# Patient Record
Sex: Female | Born: 1950 | Race: White | Hispanic: No | Smoking: Former smoker
Health system: Southern US, Community
[De-identification: ages and names within clinical notes are randomized; demographics above are authoritative.]

## PROBLEM LIST (undated history)

## (undated) DIAGNOSIS — R3915 Urgency of urination: Secondary | ICD-10-CM

## (undated) DIAGNOSIS — Z9889 Other specified postprocedural states: Secondary | ICD-10-CM

## (undated) DIAGNOSIS — J189 Pneumonia, unspecified organism: Secondary | ICD-10-CM

## (undated) DIAGNOSIS — Z8709 Personal history of other diseases of the respiratory system: Secondary | ICD-10-CM

## (undated) DIAGNOSIS — H9319 Tinnitus, unspecified ear: Secondary | ICD-10-CM

## (undated) DIAGNOSIS — Z8041 Family history of malignant neoplasm of ovary: Secondary | ICD-10-CM

## (undated) DIAGNOSIS — T8859XA Other complications of anesthesia, initial encounter: Secondary | ICD-10-CM

## (undated) DIAGNOSIS — R42 Dizziness and giddiness: Secondary | ICD-10-CM

## (undated) DIAGNOSIS — R51 Headache: Secondary | ICD-10-CM

## (undated) DIAGNOSIS — R519 Headache, unspecified: Secondary | ICD-10-CM

## (undated) DIAGNOSIS — Z800 Family history of malignant neoplasm of digestive organs: Secondary | ICD-10-CM

## (undated) DIAGNOSIS — R112 Nausea with vomiting, unspecified: Secondary | ICD-10-CM

## (undated) DIAGNOSIS — Z973 Presence of spectacles and contact lenses: Secondary | ICD-10-CM

## (undated) DIAGNOSIS — C801 Malignant (primary) neoplasm, unspecified: Secondary | ICD-10-CM

## (undated) DIAGNOSIS — T7840XA Allergy, unspecified, initial encounter: Secondary | ICD-10-CM

## (undated) DIAGNOSIS — K219 Gastro-esophageal reflux disease without esophagitis: Secondary | ICD-10-CM

## (undated) DIAGNOSIS — T4145XA Adverse effect of unspecified anesthetic, initial encounter: Secondary | ICD-10-CM

## (undated) HISTORY — DX: Gastro-esophageal reflux disease without esophagitis: K21.9

## (undated) HISTORY — DX: Allergy, unspecified, initial encounter: T78.40XA

## (undated) HISTORY — PX: APPENDECTOMY: SHX54

## (undated) HISTORY — DX: Family history of malignant neoplasm of ovary: Z80.41

## (undated) HISTORY — DX: Malignant (primary) neoplasm, unspecified: C80.1

## (undated) HISTORY — DX: Family history of malignant neoplasm of digestive organs: Z80.0

---

## 1972-06-14 HISTORY — PX: TUBAL LIGATION: SHX77

## 1975-06-15 HISTORY — PX: DIAGNOSTIC LAPAROSCOPY: SUR761

## 1978-06-14 HISTORY — PX: OTHER SURGICAL HISTORY: SHX169

## 2015-06-20 ENCOUNTER — Ambulatory Visit: Payer: BLUE CROSS/BLUE SHIELD | Attending: Gynecologic Oncology | Admitting: Gynecologic Oncology

## 2015-06-20 ENCOUNTER — Encounter: Payer: Self-pay | Admitting: Gynecologic Oncology

## 2015-06-20 ENCOUNTER — Other Ambulatory Visit (HOSPITAL_BASED_OUTPATIENT_CLINIC_OR_DEPARTMENT_OTHER): Payer: BLUE CROSS/BLUE SHIELD

## 2015-06-20 VITALS — BP 130/57 | HR 63 | Temp 98.2°F | Resp 18 | Ht 66.0 in | Wt 142.5 lb

## 2015-06-20 DIAGNOSIS — R1909 Other intra-abdominal and pelvic swelling, mass and lump: Secondary | ICD-10-CM | POA: Diagnosis not present

## 2015-06-20 DIAGNOSIS — L680 Hirsutism: Secondary | ICD-10-CM

## 2015-06-20 DIAGNOSIS — R1900 Intra-abdominal and pelvic swelling, mass and lump, unspecified site: Secondary | ICD-10-CM

## 2015-06-20 DIAGNOSIS — R971 Elevated cancer antigen 125 [CA 125]: Secondary | ICD-10-CM | POA: Diagnosis not present

## 2015-06-20 DIAGNOSIS — R970 Elevated carcinoembryonic antigen [CEA]: Secondary | ICD-10-CM

## 2015-06-20 DIAGNOSIS — Z800 Family history of malignant neoplasm of digestive organs: Secondary | ICD-10-CM

## 2015-06-20 LAB — BASIC METABOLIC PANEL
ANION GAP: 10 meq/L (ref 3–11)
BUN: 14.7 mg/dL (ref 7.0–26.0)
CALCIUM: 9.2 mg/dL (ref 8.4–10.4)
CO2: 24 mEq/L (ref 22–29)
Chloride: 106 mEq/L (ref 98–109)
Creatinine: 0.8 mg/dL (ref 0.6–1.1)
EGFR: 75 mL/min/{1.73_m2} — AB (ref >90)
Glucose: 90 mg/dl (ref 70–140)
POTASSIUM: 4.1 meq/L (ref 3.5–5.1)
SODIUM: 141 meq/L (ref 136–145)

## 2015-06-20 NOTE — Patient Instructions (Signed)
Preparing for your Surgery  Plan for surgery on June 26, 2015 with Dr. [name] you will be scheduled for an exploratory laparotomy , total abdominal hysterectomy ,  bilateral salpingo-oophorectomy ,possible omentectomy , possible debulking, possible bowel resection.  Pre-operative Testing -You will receive a phone call from presurgical testing at [facility] to arrange for a pre-operative testing appointment before your surgery.  This appointment normally occurs one to two weeks before your scheduled surgery.   -Bring your insurance card, copy of an advanced directive if applicable, medication list  -At that visit, you will be asked to sign a consent for a possible blood transfusion in case a transfusion becomes necessary during surgery.  The need for a blood transfusion is rare but having consent is a necessary part of your care.     -You should not be taking blood thinners or aspirin at least ten days prior to surgery unless instructed by your surgeon.  Day Before Surgery at [place] will be asked to take in only clear liquids the day before surgery.  Examples of clear liquids include broths, jello, and clear juices.  Avoid carbonated beverages.  You will be advised to have nothing to eat or drink after midnight the evening before.  Please drink two bottles of magnesium citrate the morning before surgery.  Start around 9 or 10 am.    Your role in recovery Your role is to become active as soon as directed by your doctor, while still giving yourself time to heal.  Rest when you feel tired. You will be asked to do the following in order to speed your recovery:  - Cough and breathe deeply. This helps toclear and expand your lungs and can prevent pneumonia. You may be given a spirometer to practice deep breathing. A staff member will show you how to use the spirometer. - Do mild physical activity. Walking or moving your legs help your circulation and body functions  return to normal. A staff member will help you when you try to walk and will provide you with simple exercises. Do not try to get up or walk alone the first time. - Actively manage your pain. Managing your pain lets you move in comfort. We will ask you to rate your pain on a scale of zero to 10. It is your responsibility to tell your doctor or nurse where and how much you hurt so your pain can be treated.  Special Considerations -If you are diabetic, you may be placed on insulin after surgery to have closer control over your blood sugars to promote healing and recovery.  This does not mean that you will be discharged on insulin.  If applicable, your oral antidiabetics will be resumed when you are tolerating a solid diet.  -Your final pathology results from surgery should be available by the Friday after surgery and the results will be relayed to you when available.  Blood Transfusion Information WHAT IS A BLOOD TRANSFUSION? A transfusion is the replacement of blood or some of its parts. Blood is made up of multiple cells which provide different functions.  Red blood cells carry oxygen and are used for blood loss replacement.  White blood cells fight against infection.  Platelets control bleeding.  Plasma helps clot blood.  Other blood products are available for specialized needs, such as hemophilia or other clotting disorders. BEFORE THE TRANSFUSION  Who gives blood for transfusions?   You may be able to donate blood to be used at a  later date on yourself (autologous donation).  Relatives can be asked to donate blood. This is generally not any safer than if you have received blood from a stranger. The same precautions are taken to ensure safety when a relative's blood is donated.  Healthy volunteers who are fully evaluated to make sure their blood is safe. This is blood bank blood. Transfusion therapy is the safest it has ever been in the practice of medicine. Before blood is taken from  a donor, a complete history is taken to make sure that person has no history of diseases nor engages in risky social behavior (examples are intravenous drug use or sexual activity with multiple partners). The donor's travel history is screened to minimize risk of transmitting infections, such as malaria. The donated blood is tested for signs of infectious diseases, such as HIV and hepatitis. The blood is then tested to be sure it is compatible with you in order to minimize the chance of a transfusion reaction. If you or a relative donates blood, this is often done in anticipation of surgery and is not appropriate for emergency situations. It takes many days to process the donated blood. RISKS AND COMPLICATIONS Although transfusion therapy is very safe and saves many lives, the main dangers of transfusion include:   Getting an infectious disease.  Developing a transfusion reaction. This is an allergic reaction to something in the blood you were given. Every precaution is taken to prevent this. The decision to have a blood transfusion has been considered carefully by your caregiver before blood is given. Blood is not given unless the benefits outweigh the risks.

## 2015-06-20 NOTE — Progress Notes (Signed)
Consult Note: Gyn-Onc  Consult was requested by Dr. Evie Lacks for the evaluation of Natalie Ball 65 y.o. female  CC:  Chief Complaint  Patient presents with  . pelvic mass    New consult    Assessment/Plan:  [name]  is a 65 y.o.  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  I have personally reviewed the images from the MRI from 06/05/15.  The mass does not appear classic in appearance for ovarian cancer, though I am very concerned for malignancy given her tumor marker elevation. It is possilbe that this is a primary mucinous neoplasm, potentially of GI primary.  We are obtaining a CT chest, abdo/pelvis to better evaluate for metastatic disease. There is no gross peritoneal disease on MRI pelvis.  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  In order to expedite her urge her scheduled her to have surgery with my partner Dr. [name] on June 26 2015. Scars that surgery will likely involve an exploratory laparotomy, total abdominal hysterectomy, bilateral salpingo-oophorectomy, and possible bowel resection. We will mechanically prep her bowels and provider would enter read preoperatively in preparation for this. I discussed the risks of this procedure including  bleeding, infection, damage to internal organs (such as bladder,ureters, bowels), blood clot, reoperation and rehospitalization.  HPI: Natalie Ball is a very pleasant 65 year old woman who is seen in consultation at the request of Dr. Evie Lacks for a complex cystic pelvic and abdominal mass. The patient reports a history of abdominal bloating since December 2016. She also reports the development of hair on her face and lower abdomen in the past 4 months. She saw her gynecologist on 06/04/2015 and reported this feeling of abdominal bloating and distention and some scoliosis ultrasound scan was  performed on 06/05/2015. This showed a large multicystic and septated mass from the right hemipelvis with vascular septations measuring 21 x 14 x 18 cm. There was also traced simple appearing pelvic free fluid. She then underwent an MRI of the pelvis on 06/05/2015 which revealed a grossly normal uterus measuring 8 cm with subserosal fibroids. There was a large complex cystic mass in the anterior pelvis extending into the mid abdomen measuring 24 x 10 x 25 cm. This mass contained numerous internal septations as well as several enhancing soft tissue nodules measuring 2-3 cm in size. 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 Tumor markers were then drawn on 06/05/2015. The CA-125 was elevated at 291.9. The CEA was elevated at 7.1.  The patient has a strong family history for colon cancer on her father's side of the family. Although she is unclear about which relatives specifically had colon cancer and their ages. Her last colonoscopy was a proximally 14 years ago. The patient reports having some altered GI function with intermittent constipation but denies hematochezia or melena or narrowed caliber of stools.   Current Meds:  Outpatient Encounter Prescriptions as of 06/20/2015  Medication Sig  . famotidine (PEPCID) 10 MG tablet Take 10 mg by mouth 2 (two) times daily.  . fluticasone (FLONASE) 50 MCG/ACT nasal spray Place 1 spray into both nostrils daily.  Marland Kitchen ibuprofen (ADVIL,MOTRIN) 200 MG tablet Take 400 mg by mouth every 6 (six) hours as needed.  . loratadine (CLARITIN) 10 MG tablet Take 10 mg by mouth daily as  needed for allergies.  . Papaya Enzyme CHEW Chew by mouth.   No facility-administered encounter medications on file as of 06/20/2015.    Allergy: Not on File  Social Hx:   Social History   Social History  . Marital Status: Widowed    Spouse Name: N/A  . Number of Children: N/A  . Years of Education: N/A   Occupational  History  . Not on file.   Social History Main Topics  . Smoking status: Former Smoker    Quit date: 06/19/1978  . Smokeless tobacco: Not on file  . Alcohol Use: Yes  . Drug Use: No  . Sexual Activity: Not on file   Other Topics Concern  . Not on file   Social History Narrative  . No narrative on file    Past Surgical Hx:  Past Surgical History  Procedure Laterality Date  . Tubal ligation  1974  . Diagnostic laparoscopy  1977  . Rectal fusula  1980    Past Medical Hx:  Past Medical History  Diagnosis Date  . Allergy   . GERD (gastroesophageal reflux disease)     Past Gynecological History:   No LMP recorded.  Family Hx:  Family History  Problem Relation Age of Onset  . Cancer Father   . Cancer Maternal Aunt   . Cancer Maternal Uncle     Review of Systems:  Constitutional  Feels bloated  ENT Normal appearing ears and nares bilaterally Skin/Breast  No rash, sores, jaundice, itching, dryness Cardiovascular  No chest pain, shortness of breath, or edema  Pulmonary  No cough or wheeze.  Gastro Intestinal  No nausea, vomitting, or diarrhoea. No bright red blood per rectum, no abdominal pain, change in bowel movement, or constipation.  Genito Urinary  No frequency, urgency, dysuria, + bloating, no bleeding Musculo Skeletal  No myalgia, arthralgia, joint swelling or pain  Neurologic  No weakness, numbness, change in gait,  Psychology  No depression, anxiety, insomnia.   Vitals:  Blood pressure 130/57, pulse 63, temperature 98.2 F (36.8 C), temperature source Oral, resp. rate 18, height 5\' 6"  (1.676 m), weight 142 lb 8 oz (64.638 kg), SpO2 100 %.  Physical Exam: WD in NAD Neck  Supple NROM, without any enlargements.  Lymph Node Survey No cervical supraclavicular or inguinal adenopathy Cardiovascular  Pulse normal rate, regularity and rhythm. S1 and S2 normal.  Lungs  Clear to auscultation bilateraly, without wheezes/crackles/rhonchi. Good air  movement.  Skin  No rash/lesions/breakdown  Psychiatry  Alert and oriented to person, place, and time  Abdomen  Normoactive bowel sounds, abdomen soft, non-tender and thin without evidence of hernia. Large cystic fullness in the lower abdomen to umbilicus.  Back No CVA tenderness Genito Urinary  Vulva/vagina: Normal external female genitalia.  No lesions. No discharge or bleeding.  Bladder/urethra:  No lesions or masses, well supported bladder  Vagina: grossly normal  Cervix: Normal appearing, no lesions.  Uterus:  Small, mobile, no parametrial involvement or nodularity.  Adnexa: large soft, cystic fullness throughout the pelvis. Predominantly anteriorally. Not palpable in the cul de sac.  Rectal  Good tone, no masses no cul de sac nodularity.  Extremities  No bilateral cyanosis, clubbing or edema.   Donaciano Eva, MD  06/20/2015, 4:45 PM

## 2015-06-24 ENCOUNTER — Ambulatory Visit (HOSPITAL_COMMUNITY)
Admission: RE | Admit: 2015-06-24 | Discharge: 2015-06-24 | Disposition: A | Discharge status: Home / Self Care | Payer: BLUE CROSS/BLUE SHIELD | Source: Ambulatory Visit | Attending: Gynecologic Oncology | Admitting: Gynecologic Oncology

## 2015-06-24 ENCOUNTER — Encounter (HOSPITAL_COMMUNITY)
Admission: RE | Admit: 2015-06-24 | Discharge: 2015-06-24 | Disposition: A | Discharge status: Home / Self Care | Payer: BLUE CROSS/BLUE SHIELD | Source: Ambulatory Visit | Attending: Gynecologic Oncology | Admitting: Gynecologic Oncology

## 2015-06-24 ENCOUNTER — Encounter (HOSPITAL_COMMUNITY): Payer: Self-pay

## 2015-06-24 DIAGNOSIS — K449 Diaphragmatic hernia without obstruction or gangrene: Secondary | ICD-10-CM

## 2015-06-24 DIAGNOSIS — Z01812 Encounter for preprocedural laboratory examination: Secondary | ICD-10-CM | POA: Insufficient documentation

## 2015-06-24 DIAGNOSIS — R1900 Intra-abdominal and pelvic swelling, mass and lump, unspecified site: Secondary | ICD-10-CM | POA: Insufficient documentation

## 2015-06-24 DIAGNOSIS — R971 Elevated cancer antigen 125 [CA 125]: Secondary | ICD-10-CM | POA: Insufficient documentation

## 2015-06-24 DIAGNOSIS — R970 Elevated carcinoembryonic antigen [CEA]: Secondary | ICD-10-CM | POA: Insufficient documentation

## 2015-06-24 HISTORY — DX: Pneumonia, unspecified organism: J18.9

## 2015-06-24 HISTORY — DX: Dizziness and giddiness: R42

## 2015-06-24 HISTORY — DX: Headache, unspecified: R51.9

## 2015-06-24 HISTORY — DX: Adverse effect of unspecified anesthetic, initial encounter: T41.45XA

## 2015-06-24 HISTORY — DX: Urgency of urination: R39.15

## 2015-06-24 HISTORY — DX: Personal history of other diseases of the respiratory system: Z87.09

## 2015-06-24 HISTORY — DX: Presence of spectacles and contact lenses: Z97.3

## 2015-06-24 HISTORY — DX: Tinnitus, unspecified ear: H93.19

## 2015-06-24 HISTORY — DX: Other specified postprocedural states: R11.2

## 2015-06-24 HISTORY — DX: Headache: R51

## 2015-06-24 HISTORY — DX: Other complications of anesthesia, initial encounter: T88.59XA

## 2015-06-24 HISTORY — DX: Other specified postprocedural states: Z98.890

## 2015-06-24 LAB — CBC WITH DIFFERENTIAL/PLATELET
BASOS ABS: 0 10*3/uL (ref 0.0–0.1)
Basophils Relative: 0 %
EOS ABS: 0.1 10*3/uL (ref 0.0–0.7)
EOS PCT: 1 %
HCT: 39.8 % (ref 36.0–46.0)
Hemoglobin: 12.9 g/dL (ref 12.0–15.0)
LYMPHS PCT: 18 %
Lymphs Abs: 1.6 10*3/uL (ref 0.7–4.0)
MCH: 32.1 pg (ref 26.0–34.0)
MCHC: 32.4 g/dL (ref 30.0–36.0)
MCV: 99 fL (ref 78.0–100.0)
Monocytes Absolute: 0.5 10*3/uL (ref 0.1–1.0)
Monocytes Relative: 6 %
Neutro Abs: 6.5 10*3/uL (ref 1.7–7.7)
Neutrophils Relative %: 75 %
PLATELETS: 354 10*3/uL (ref 150–400)
RBC: 4.02 MIL/uL (ref 3.87–5.11)
RDW: 13.4 % (ref 11.5–15.5)
WBC: 8.8 10*3/uL (ref 4.0–10.5)

## 2015-06-24 LAB — ABO/RH: ABO/RH(D): A POS

## 2015-06-24 LAB — COMPREHENSIVE METABOLIC PANEL
ALT: 11 U/L — AB (ref 14–54)
AST: 16 U/L (ref 15–41)
Albumin: 4.4 g/dL (ref 3.5–5.0)
Alkaline Phosphatase: 56 U/L (ref 38–126)
Anion gap: 8 (ref 5–15)
BUN: 13 mg/dL (ref 6–20)
CHLORIDE: 103 mmol/L (ref 101–111)
CO2: 27 mmol/L (ref 22–32)
CREATININE: 0.74 mg/dL (ref 0.44–1.00)
Calcium: 9.2 mg/dL (ref 8.9–10.3)
GFR calc Af Amer: >60 mL/min (ref >60)
GFR calc non Af Amer: >60 mL/min (ref >60)
Glucose, Bld: 90 mg/dL (ref 65–99)
Potassium: 4.3 mmol/L (ref 3.5–5.1)
SODIUM: 138 mmol/L (ref 135–145)
Total Bilirubin: 0.9 mg/dL (ref 0.3–1.2)
Total Protein: 7.4 g/dL (ref 6.5–8.1)

## 2015-06-24 LAB — URINALYSIS, ROUTINE W REFLEX MICROSCOPIC
Bilirubin Urine: NEGATIVE
Glucose, UA: NEGATIVE mg/dL
Hgb urine dipstick: NEGATIVE
Ketones, ur: 15 mg/dL — AB
Leukocytes, UA: NEGATIVE
NITRITE: NEGATIVE
Protein, ur: NEGATIVE mg/dL
SPECIFIC GRAVITY, URINE: 1.015 (ref 1.005–1.030)
pH: 6.5 (ref 5.0–8.0)

## 2015-06-24 MED ORDER — IOHEXOL 300 MG/ML  SOLN
100.0000 mL | Freq: Once | INTRAMUSCULAR | Status: AC | PRN
  Administered 2015-06-24: 100 mL via INTRAVENOUS

## 2015-06-24 NOTE — Patient Instructions (Addendum)
Natalie Ball  06/24/2015   Your procedure is scheduled on: Thursday June 26, 2015   Report to [facility] Main  Entrance take [place]  elevators to 3rd floor to  [place] at 11:45 AM.  Call this number if you have problems the morning of surgery [phone]   Remember: ONLY 1 PERSON MAY GO WITH YOU TO SHORT STAY TO GET  READY MORNING OF [place].  Do not eat food or drink liquids :After Midnight. CLEAR LIQUID DIET DAY PRIOR TO SURGERY. NO CARBONATED BEVERAGES.      Take these medicines the morning of surgery with A SIP OF WATER: FAMOTIDINE (PEPCID); FLONASE IF NEEDED; LORATADINE (CLARITIN) IF NEEDED                                You may not have any metal on your body including hair pins and              piercings  Do not wear jewelry, make-up, lotions, powders or perfumes, deodorant             Do not wear nail polish.  Do not shave  48 hours prior to surgery.               Do not bring valuables to the hospital. [name].  Contacts, dentures or bridgework may not be worn into surgery.  Leave suitcase in the car. After surgery it may be brought to your room.              FOLLOW SURGEON'S INSTRUCTION IN REGARDS TO BOWEL PREPRATION PRIOR TO SURGERY                 Please read over the following fact sheets you were given:INCENTIVE SPIROMETER; BLOOD TRANSFUSION INFORMATION SHEET  _____________________________________________________________________             [facility] - Preparing for Surgery Before surgery, you can play an important role.  Because skin is not sterile, your skin needs to be as free of germs as possible.  You can reduce the number of germs on your skin by washing with CHG (chlorahexidine gluconate) soap before surgery.  CHG is an antiseptic cleaner which kills germs and bonds with the skin to continue killing germs even after washing. Please DO NOT use if you have an allergy to  CHG or antibacterial soaps.  If your skin becomes reddened/irritated stop using the CHG and inform your nurse when you arrive at Short Stay. Do not shave (including legs and underarms) for at least 48 hours prior to the first CHG shower.  You may shave your face/neck. Please follow these instructions carefully:  1.  Shower with CHG Soap the night before surgery and the  morning of Surgery.  2.  If you choose to wash your hair, wash your hair first as usual with your  normal  shampoo.  3.  After you shampoo, rinse your hair and body thoroughly to remove the  shampoo.                           4.  Use CHG as you would any other liquid soap.  You can apply  chg directly  to the skin and wash                       Gently with a scrungie or clean washcloth.  5.  Apply the CHG Soap to your body ONLY FROM THE NECK DOWN.   Do not use on face/ open                           Wound or open sores. Avoid contact with eyes, ears mouth and genitals (private parts).                       Wash face,  Genitals (private parts) with your normal soap.             6.  Wash thoroughly, paying special attention to the area where your surgery  will be performed.  7.  Thoroughly rinse your body with warm water from the neck down.  8.  DO NOT shower/wash with your normal soap after using and rinsing off  the CHG Soap.                9.  Pat yourself dry with a clean towel.            10.  Wear clean pajamas.            11.  Place clean sheets on your bed the night of your first shower and do not  sleep with pets. Day of Surgery : Do not apply any lotions/deodorants the morning of surgery.  Please wear clean clothes to the hospital/surgery center.  FAILURE TO FOLLOW THESE INSTRUCTIONS MAY RESULT IN THE CANCELLATION OF YOUR SURGERY PATIENT SIGNATURE_________________________________  NURSE SIGNATURE__________________________________  ________________________________________________________________________   [name]  An incentive spirometer is a tool that can help keep your lungs clear and active. This tool measures how well you are filling your lungs with each breath. Taking long deep breaths may help reverse or decrease the chance of developing breathing (pulmonary) problems (especially infection) following:  A long period of time when you are unable to move or be active. BEFORE THE PROCEDURE   If the spirometer includes an indicator to show your best effort, your nurse or respiratory therapist will set it to a desired goal.  If possible, sit up straight or lean slightly forward. Try not to slouch.  Hold the incentive spirometer in an upright position. INSTRUCTIONS FOR USE   Sit on the edge of your bed if possible, or sit up as far as you can in bed or on a chair.  Hold the incentive spirometer in an upright position.  Breathe out normally.  Place the mouthpiece in your mouth and seal your lips tightly around it.  Breathe in slowly and as deeply as possible, raising the piston or the ball toward the top of the column.  Hold your breath for 3-5 seconds or for as long as possible. Allow the piston or ball to fall to the bottom of the column.  Remove the mouthpiece from your mouth and breathe out normally.  Rest for a few seconds and repeat Steps 1 through 7 at least 10 times every 1-2 hours when you are awake. Take your time and take a few normal breaths between deep breaths.  The spirometer may include an indicator to show your best effort. Use the indicator as a goal to work toward during each repetition.  After each set of 10 deep breaths, practice coughing to be sure your lungs are clear. If you have an incision (the cut made at the time of surgery), support your incision when coughing by placing a pillow or rolled up towels firmly against it. Once you are able to get out of bed, walk around indoors and cough well. You may stop using the incentive spirometer when instructed by  your caregiver.  RISKS AND COMPLICATIONS  Take your time so you do not get dizzy or light-headed.  If you are in pain, you may need to take or ask for pain medication before doing incentive spirometry. It is harder to take a deep breath if you are having pain. AFTER USE  Rest and breathe slowly and easily.  It can be helpful to keep track of a log of your progress. Your caregiver can provide you with a simple table to help with this. If you are using the spirometer at home, follow these instructions: [place] IF:   You are having difficultly using the spirometer.  You have trouble using the spirometer as often as instructed.  Your pain medication is not giving enough relief while using the spirometer.  You develop fever of 100.5 F (38.1 C) or higher. SEEK IMMEDIATE MEDICAL CARE IF:   You cough up bloody sputum that had not been present before.  You develop fever of 102 F (38.9 C) or greater.  You develop worsening pain at or near the incision site. MAKE SURE YOU:   Understand these instructions.  Will watch your condition.  Will get help right away if you are not doing well or get worse. Document Released: 10/11/2006 Document Revised: 08/23/2011 Document Reviewed: 12/12/2006 ExitCare Patient Information 2014 ExitCare, [place].   ________________________________________________________________________  WHAT IS A BLOOD TRANSFUSION? Blood Transfusion Information  A transfusion is the replacement of blood or some of its parts. Blood is made up of multiple cells which provide different functions.  Red blood cells carry oxygen and are used for blood loss replacement.  White blood cells fight against infection.  Platelets control bleeding.  Plasma helps clot blood.  Other blood products are available for specialized needs, such as hemophilia or other clotting disorders. BEFORE THE TRANSFUSION  Who gives blood for transfusions?   Healthy volunteers who are  fully evaluated to make sure their blood is safe. This is blood bank blood. Transfusion therapy is the safest it has ever been in the practice of medicine. Before blood is taken from a donor, a complete history is taken to make sure that person has no history of diseases nor engages in risky social behavior (examples are intravenous drug use or sexual activity with multiple partners). The donor's travel history is screened to minimize risk of transmitting infections, such as malaria. The donated blood is tested for signs of infectious diseases, such as HIV and hepatitis. The blood is then tested to be sure it is compatible with you in order to minimize the chance of a transfusion reaction. If you or a relative donates blood, this is often done in anticipation of surgery and is not appropriate for emergency situations. It takes many days to process the donated blood. RISKS AND COMPLICATIONS Although transfusion therapy is very safe and saves many lives, the main dangers of transfusion include:   Getting an infectious disease.  Developing a transfusion reaction. This is an allergic reaction to something in the blood you were given. Every precaution is taken to prevent this. The decision  to have a blood transfusion has been considered carefully by your caregiver before blood is given. Blood is not given unless the benefits outweigh the risks. AFTER THE TRANSFUSION  Right after receiving a blood transfusion, you will usually feel much better and more energetic. This is especially true if your red blood cells have gotten low (anemic). The transfusion raises the level of the red blood cells which carry oxygen, and this usually causes an energy increase.  The nurse administering the transfusion will monitor you carefully for complications. HOME CARE INSTRUCTIONS  No special instructions are needed after a transfusion. You may find your energy is better. Speak with your caregiver about any limitations on  activity for underlying diseases you may have. SEEK MEDICAL CARE IF:   Your condition is not improving after your transfusion.  You develop redness or irritation at the intravenous (IV) site. SEEK IMMEDIATE MEDICAL CARE IF:  Any of the following symptoms occur over the next 12 hours:  Shaking chills.  You have a temperature by mouth above 102 F (38.9 C), not controlled by medicine.  Chest, back, or muscle pain.  People around you feel you are not acting correctly or are confused.  Shortness of breath or difficulty breathing.  Dizziness and fainting.  You get a rash or develop hives.  You have a decrease in urine output.  Your urine turns a dark color or changes to pink, red, or brown. Any of the following symptoms occur over the next 10 days:  You have a temperature by mouth above 102 F (38.9 C), not controlled by medicine.  Shortness of breath.  Weakness after normal activity.  The white part of the eye turns yellow (jaundice).  You have a decrease in the amount of urine or are urinating less often.  Your urine turns a dark color or changes to pink, red, or brown. Document Released: 05/28/2000 Document Revised: 08/23/2011 Document Reviewed: 01/15/2008 [facility] Patient Information 2014 [place], [place].  _______________________________________________________________________

## 2015-06-25 NOTE — Progress Notes (Signed)
Urinalysis results in epic per PAT visit 06/24/2015 sent to Dr Rogers Blocker, and Dr Denman George.

## 2015-06-26 ENCOUNTER — Inpatient Hospital Stay (HOSPITAL_COMMUNITY): Payer: BLUE CROSS/BLUE SHIELD | Admitting: Anesthesiology

## 2015-06-26 ENCOUNTER — Inpatient Hospital Stay (HOSPITAL_COMMUNITY)
Admission: RE | Admit: 2015-06-26 | Discharge: 2015-06-28 | DRG: 738 | Disposition: A | Discharge status: Home / Self Care | Payer: BLUE CROSS/BLUE SHIELD | Source: Ambulatory Visit | Attending: Gynecologic Oncology | Admitting: Gynecologic Oncology

## 2015-06-26 ENCOUNTER — Encounter (HOSPITAL_COMMUNITY): Payer: Self-pay | Admitting: *Deleted

## 2015-06-26 ENCOUNTER — Encounter (HOSPITAL_COMMUNITY)
Admission: RE | Disposition: A | Discharge status: Home / Self Care | Payer: Self-pay | Source: Ambulatory Visit | Attending: Gynecologic Oncology

## 2015-06-26 DIAGNOSIS — Z01812 Encounter for preprocedural laboratory examination: Secondary | ICD-10-CM

## 2015-06-26 DIAGNOSIS — Z87891 Personal history of nicotine dependence: Secondary | ICD-10-CM | POA: Diagnosis not present

## 2015-06-26 DIAGNOSIS — D0739 Carcinoma in situ of other female genital organs: Secondary | ICD-10-CM

## 2015-06-26 DIAGNOSIS — Z800 Family history of malignant neoplasm of digestive organs: Secondary | ICD-10-CM | POA: Diagnosis not present

## 2015-06-26 DIAGNOSIS — K219 Gastro-esophageal reflux disease without esophagitis: Secondary | ICD-10-CM | POA: Diagnosis present

## 2015-06-26 DIAGNOSIS — C561 Malignant neoplasm of right ovary: Principal | ICD-10-CM | POA: Diagnosis present

## 2015-06-26 DIAGNOSIS — R1900 Intra-abdominal and pelvic swelling, mass and lump, unspecified site: Secondary | ICD-10-CM | POA: Diagnosis present

## 2015-06-26 DIAGNOSIS — D252 Subserosal leiomyoma of uterus: Secondary | ICD-10-CM | POA: Diagnosis present

## 2015-06-26 HISTORY — PX: ABDOMINAL HYSTERECTOMY: SHX81

## 2015-06-26 HISTORY — PX: SALPINGOOPHORECTOMY: SHX82

## 2015-06-26 HISTORY — PX: OMENTECTOMY: SHX5985

## 2015-06-26 HISTORY — PX: LAPAROTOMY: SHX154

## 2015-06-26 HISTORY — PX: DEBULKING: SHX6277

## 2015-06-26 LAB — TYPE AND SCREEN
ABO/RH(D): A POS
Antibody Screen: NEGATIVE

## 2015-06-26 SURGERY — LAPAROTOMY, EXPLORATORY
Anesthesia: General | Site: Abdomen

## 2015-06-26 MED ORDER — ENSURE ENLIVE PO LIQD
237.0000 mL | Freq: Two times a day (BID) | ORAL | Status: DC
  Administered 2015-06-27: 237 mL via ORAL

## 2015-06-26 MED ORDER — ONDANSETRON HCL 4 MG PO TABS
4.0000 mg | ORAL_TABLET | Freq: Four times a day (QID) | ORAL | Status: DC | PRN
  Administered 2015-06-28: 4 mg via ORAL
  Filled 2015-06-26: qty 1

## 2015-06-26 MED ORDER — HYDROMORPHONE HCL 1 MG/ML IJ SOLN
INTRAMUSCULAR | Status: AC
  Filled 2015-06-26: qty 1

## 2015-06-26 MED ORDER — ESMOLOL HCL 100 MG/10ML IV SOLN
INTRAVENOUS | Status: AC
  Filled 2015-06-26: qty 10

## 2015-06-26 MED ORDER — PROPOFOL 10 MG/ML IV BOLUS
INTRAVENOUS | Status: DC | PRN
  Administered 2015-06-26: 120 mg via INTRAVENOUS
  Administered 2015-06-26: 80 mg via INTRAVENOUS

## 2015-06-26 MED ORDER — SUGAMMADEX SODIUM 200 MG/2ML IV SOLN
INTRAVENOUS | Status: AC
  Filled 2015-06-26: qty 2

## 2015-06-26 MED ORDER — HEPARIN SODIUM (PORCINE) 5000 UNIT/ML IJ SOLN
5000.0000 [IU] | INTRAMUSCULAR | Status: AC
  Administered 2015-06-26: 5000 [IU] via SUBCUTANEOUS
  Filled 2015-06-26: qty 1

## 2015-06-26 MED ORDER — PROMETHAZINE HCL 25 MG/ML IJ SOLN
INTRAMUSCULAR | Status: AC
  Filled 2015-06-26: qty 1

## 2015-06-26 MED ORDER — LACTATED RINGERS IV SOLN
INTRAVENOUS | Status: DC
  Administered 2015-06-26: 1000 mL via INTRAVENOUS
  Administered 2015-06-26 (×3): via INTRAVENOUS

## 2015-06-26 MED ORDER — FLUTICASONE PROPIONATE 50 MCG/ACT NA SUSP
1.0000 | Freq: Every day | NASAL | Status: DC
  Administered 2015-06-27 – 2015-06-28 (×2): 1 via NASAL
  Filled 2015-06-26 (×2): qty 16

## 2015-06-26 MED ORDER — PROMETHAZINE HCL 25 MG/ML IJ SOLN
6.2500 mg | INTRAMUSCULAR | Status: DC | PRN
  Administered 2015-06-26: 12.5 mg via INTRAVENOUS

## 2015-06-26 MED ORDER — SODIUM CHLORIDE 0.9 % IJ SOLN
INTRAMUSCULAR | Status: AC
  Filled 2015-06-26: qty 100

## 2015-06-26 MED ORDER — PHENYLEPHRINE HCL 10 MG/ML IJ SOLN
INTRAMUSCULAR | Status: DC | PRN
  Administered 2015-06-26 (×3): 80 ug via INTRAVENOUS

## 2015-06-26 MED ORDER — DEXTROSE 5 % IV SOLN
2.0000 g | INTRAVENOUS | Status: AC
Start: 2015-06-26 — End: 2015-06-26
  Administered 2015-06-26: 2 g via INTRAVENOUS

## 2015-06-26 MED ORDER — OXYCODONE HCL 5 MG/5ML PO SOLN: 5.0000 mg | Freq: Once | ORAL | Status: DC | PRN

## 2015-06-26 MED ORDER — PHENOL 1.4 % MT LIQD
1.0000 | OROMUCOSAL | Status: DC | PRN
  Filled 2015-06-26: qty 177

## 2015-06-26 MED ORDER — SCOPOLAMINE 1 MG/3DAYS TD PT72
MEDICATED_PATCH | TRANSDERMAL | Status: AC
  Filled 2015-06-26: qty 1

## 2015-06-26 MED ORDER — ROCURONIUM BROMIDE 100 MG/10ML IV SOLN
INTRAVENOUS | Status: DC | PRN
  Administered 2015-06-26: 50 mg via INTRAVENOUS
  Administered 2015-06-26: 20 mg via INTRAVENOUS

## 2015-06-26 MED ORDER — ONDANSETRON HCL 4 MG/2ML IJ SOLN
INTRAMUSCULAR | Status: AC
  Filled 2015-06-26: qty 2

## 2015-06-26 MED ORDER — LIP MEDEX EX OINT
TOPICAL_OINTMENT | CUTANEOUS | Status: AC
  Filled 2015-06-26: qty 7

## 2015-06-26 MED ORDER — 0.9 % SODIUM CHLORIDE (POUR BTL) OPTIME
TOPICAL | Status: DC | PRN
  Administered 2015-06-26: 3000 mL

## 2015-06-26 MED ORDER — SCOPOLAMINE 1 MG/3DAYS TD PT72
1.0000 | MEDICATED_PATCH | Freq: Once | TRANSDERMAL | Status: DC
  Administered 2015-06-26: 1.5 mg via TRANSDERMAL
  Filled 2015-06-26: qty 1

## 2015-06-26 MED ORDER — FENTANYL CITRATE (PF) 100 MCG/2ML IJ SOLN
INTRAMUSCULAR | Status: DC | PRN
  Administered 2015-06-26: 50 ug via INTRAVENOUS
  Administered 2015-06-26: 100 ug via INTRAVENOUS
  Administered 2015-06-26 (×2): 50 ug via INTRAVENOUS

## 2015-06-26 MED ORDER — DEXTROSE 5 % IV SOLN
INTRAVENOUS | Status: AC
  Filled 2015-06-26: qty 2

## 2015-06-26 MED ORDER — ONDANSETRON HCL 4 MG/2ML IJ SOLN: 4.0000 mg | Freq: Four times a day (QID) | INTRAMUSCULAR | Status: DC | PRN

## 2015-06-26 MED ORDER — LIDOCAINE HCL (CARDIAC) 20 MG/ML IV SOLN
INTRAVENOUS | Status: DC | PRN
  Administered 2015-06-26: 50 mg via INTRAVENOUS

## 2015-06-26 MED ORDER — HYDROMORPHONE HCL 1 MG/ML IJ SOLN
INTRAMUSCULAR | Status: DC | PRN
  Administered 2015-06-26 (×3): 0.5 mg via INTRAVENOUS

## 2015-06-26 MED ORDER — ESMOLOL HCL 100 MG/10ML IV SOLN
INTRAVENOUS | Status: DC | PRN
  Administered 2015-06-26: 15 mg via INTRAVENOUS

## 2015-06-26 MED ORDER — SODIUM CHLORIDE 0.9 % IJ SOLN
INTRAMUSCULAR | Status: AC
  Filled 2015-06-26: qty 50

## 2015-06-26 MED ORDER — MIDAZOLAM HCL 5 MG/5ML IJ SOLN
INTRAMUSCULAR | Status: DC | PRN
  Administered 2015-06-26: 2 mg via INTRAVENOUS

## 2015-06-26 MED ORDER — PROPOFOL 10 MG/ML IV BOLUS
INTRAVENOUS | Status: AC
  Filled 2015-06-26: qty 20

## 2015-06-26 MED ORDER — PHENYLEPHRINE 40 MCG/ML (10ML) SYRINGE FOR IV PUSH (FOR BLOOD PRESSURE SUPPORT)
PREFILLED_SYRINGE | INTRAVENOUS | Status: AC
  Filled 2015-06-26: qty 10

## 2015-06-26 MED ORDER — FAMOTIDINE 20 MG PO TABS
20.0000 mg | ORAL_TABLET | Freq: Every day | ORAL | Status: DC
  Administered 2015-06-27 – 2015-06-28 (×2): 20 mg via ORAL
  Filled 2015-06-26 (×2): qty 1

## 2015-06-26 MED ORDER — OXYCODONE HCL 5 MG PO TABS: 5.0000 mg | ORAL_TABLET | Freq: Once | ORAL | Status: DC | PRN

## 2015-06-26 MED ORDER — MIDAZOLAM HCL 2 MG/2ML IJ SOLN
INTRAMUSCULAR | Status: AC
  Filled 2015-06-26: qty 2

## 2015-06-26 MED ORDER — SUGAMMADEX SODIUM 200 MG/2ML IV SOLN
INTRAVENOUS | Status: DC | PRN
  Administered 2015-06-26: 200 mg via INTRAVENOUS

## 2015-06-26 MED ORDER — OXYCODONE HCL 5 MG PO TABS: 5.0000 mg | ORAL_TABLET | ORAL | Status: DC | PRN

## 2015-06-26 MED ORDER — GABAPENTIN 300 MG PO CAPS
600.0000 mg | ORAL_CAPSULE | Freq: Every day | ORAL | Status: DC
  Administered 2015-06-26 – 2015-06-27 (×2): 600 mg via ORAL
  Filled 2015-06-26 (×3): qty 2

## 2015-06-26 MED ORDER — HYDROMORPHONE HCL 2 MG/ML IJ SOLN
INTRAMUSCULAR | Status: AC
  Filled 2015-06-26: qty 1

## 2015-06-26 MED ORDER — KETOROLAC TROMETHAMINE 30 MG/ML IJ SOLN
15.0000 mg | Freq: Four times a day (QID) | INTRAMUSCULAR | Status: AC
  Filled 2015-06-26: qty 1

## 2015-06-26 MED ORDER — KETOROLAC TROMETHAMINE 30 MG/ML IJ SOLN
15.0000 mg | Freq: Four times a day (QID) | INTRAMUSCULAR | Status: AC
  Administered 2015-06-27 (×3): 15 mg via INTRAVENOUS
  Filled 2015-06-26 (×3): qty 1

## 2015-06-26 MED ORDER — FENTANYL CITRATE (PF) 250 MCG/5ML IJ SOLN
INTRAMUSCULAR | Status: AC
  Filled 2015-06-26: qty 5

## 2015-06-26 MED ORDER — ACETAMINOPHEN 500 MG PO TABS
1000.0000 mg | ORAL_TABLET | Freq: Four times a day (QID) | ORAL | Status: DC
  Administered 2015-06-26 – 2015-06-28 (×5): 1000 mg via ORAL
  Filled 2015-06-26 (×12): qty 2

## 2015-06-26 MED ORDER — ENOXAPARIN SODIUM 40 MG/0.4ML ~~LOC~~ SOLN
40.0000 mg | SUBCUTANEOUS | Status: DC
  Administered 2015-06-27 – 2015-06-28 (×2): 40 mg via SUBCUTANEOUS
  Filled 2015-06-26 (×3): qty 0.4

## 2015-06-26 MED ORDER — LIDOCAINE HCL (CARDIAC) 20 MG/ML IV SOLN
INTRAVENOUS | Status: AC
  Filled 2015-06-26: qty 5

## 2015-06-26 MED ORDER — HYDROMORPHONE HCL 1 MG/ML IJ SOLN
0.2500 mg | INTRAMUSCULAR | Status: DC | PRN
  Administered 2015-06-26: 0.25 mg via INTRAVENOUS

## 2015-06-26 MED ORDER — SODIUM CHLORIDE 0.9 % IJ SOLN
INTRAMUSCULAR | Status: AC
  Filled 2015-06-26: qty 20

## 2015-06-26 MED ORDER — ONDANSETRON HCL 4 MG/2ML IJ SOLN
INTRAMUSCULAR | Status: DC | PRN
  Administered 2015-06-26: 4 mg via INTRAVENOUS

## 2015-06-26 MED ORDER — KCL IN DEXTROSE-NACL 20-5-0.45 MEQ/L-%-% IV SOLN
INTRAVENOUS | Status: DC
  Administered 2015-06-26: 23:00:00 via INTRAVENOUS
  Filled 2015-06-26 (×3): qty 1000

## 2015-06-26 MED ORDER — BUPIVACAINE LIPOSOME 1.3 % IJ SUSP
20.0000 mL | Freq: Once | INTRAMUSCULAR | Status: AC
  Administered 2015-06-26: 20 mL
  Filled 2015-06-26: qty 20

## 2015-06-26 MED ORDER — EPHEDRINE SULFATE 50 MG/ML IJ SOLN
INTRAMUSCULAR | Status: DC | PRN
  Administered 2015-06-26: 10 mg via INTRAVENOUS

## 2015-06-26 SURGICAL SUPPLY — 68 items
APPLIER CLIP 11 MED OPEN (CLIP)
APPLIER CLIP 13 LRG OPEN (CLIP)
ATTRACTOMAT 16X20 MAGNETIC DRP (DRAPES) IMPLANT
BAG URINE DRAINAGE (UROLOGICAL SUPPLIES) IMPLANT
BENZOIN TINCTURE PRP APPL 2/3 (GAUZE/BANDAGES/DRESSINGS) IMPLANT
BLADE EXTENDED COATED 6.5IN (ELECTRODE) ×4 IMPLANT
CATH FOLEY 2WAY SLVR  5CC 16FR (CATHETERS) ×2
CATH FOLEY 2WAY SLVR 5CC 16FR (CATHETERS) ×2 IMPLANT
CHLORAPREP W/TINT 26ML (MISCELLANEOUS) ×4 IMPLANT
CLIP APPLIE 11 MED OPEN (CLIP) IMPLANT
CLIP APPLIE 13 LRG OPEN (CLIP) IMPLANT
CLIP TI MEDIUM LARGE 6 (CLIP) ×8 IMPLANT
CLOSURE WOUND 1/2 X4 (GAUZE/BANDAGES/DRESSINGS)
CONT SPEC 4OZ CLIKSEAL STRL BL (MISCELLANEOUS) ×4 IMPLANT
COVER SURGICAL LIGHT HANDLE (MISCELLANEOUS) ×4 IMPLANT
DERMABOND ADVANCED (GAUZE/BANDAGES/DRESSINGS) ×2
DERMABOND ADVANCED .7 DNX12 (GAUZE/BANDAGES/DRESSINGS) ×2 IMPLANT
DRAPE TABLE BACK 44X90 PK DISP (DRAPES) ×4 IMPLANT
DRAPE UTILITY XL STRL (DRAPES) IMPLANT
DRAPE WARM FLUID 44X44 (DRAPE) ×4 IMPLANT
ELECT REM PT RETURN 9FT ADLT (ELECTROSURGICAL) ×4
ELECTRODE REM PT RTRN 9FT ADLT (ELECTROSURGICAL) ×2 IMPLANT
GAUZE SPONGE 4X4 12PLY STRL (GAUZE/BANDAGES/DRESSINGS) ×4 IMPLANT
GAUZE SPONGE 4X4 16PLY XRAY LF (GAUZE/BANDAGES/DRESSINGS) ×4 IMPLANT
GLOVE BIO SURGEON STRL SZ 6.5 (GLOVE) ×3 IMPLANT
GLOVE BIO SURGEON STRL SZ7.5 (GLOVE) ×8 IMPLANT
GLOVE BIO SURGEONS STRL SZ 6.5 (GLOVE) ×1
GLOVE BIOGEL PI IND STRL 7.0 (GLOVE) ×2 IMPLANT
GLOVE BIOGEL PI INDICATOR 7.0 (GLOVE) ×2
GLOVE INDICATOR 8.0 STRL GRN (GLOVE) ×4 IMPLANT
GOWN BRE IMP PREV XXLGXLNG (GOWN DISPOSABLE) ×4 IMPLANT
GOWN STRL REUS W/TWL XL LVL3 (GOWN DISPOSABLE) ×4 IMPLANT
HOLDER FOLEY CATH W/STRAP (MISCELLANEOUS) ×4 IMPLANT
KIT BASIN OR (CUSTOM PROCEDURE TRAY) ×4 IMPLANT
LIGASURE IMPACT 36 18CM CVD LR (INSTRUMENTS) ×4 IMPLANT
NEEDLE HYPO 22GX1.5 SAFETY (NEEDLE) ×8 IMPLANT
NEEDLE HYPO 25X1 1.5 SAFETY (NEEDLE) ×4 IMPLANT
NS IRRIG 1000ML POUR BTL (IV SOLUTION) ×16 IMPLANT
PACK GENERAL/GYN (CUSTOM PROCEDURE TRAY) ×4 IMPLANT
RETRACTOR WND ALEXIS 25 LRG (MISCELLANEOUS) ×2 IMPLANT
RTRCTR WOUND ALEXIS 25CM LRG (MISCELLANEOUS) ×4
SHEET LAVH (DRAPES) ×4 IMPLANT
SPONGE LAP 18X18 X RAY DECT (DISPOSABLE) ×8 IMPLANT
STRIP CLOSURE SKIN 1/2X4 (GAUZE/BANDAGES/DRESSINGS) IMPLANT
SUT ETHILON 1 LR 30 (SUTURE) IMPLANT
SUT PDS AB 0 CT1 36 (SUTURE) ×8 IMPLANT
SUT PDS AB 0 CTX 60 (SUTURE) ×8 IMPLANT
SUT PDS AB 1 CTXB1 36 (SUTURE) ×8 IMPLANT
SUT SILK 2 0 (SUTURE)
SUT SILK 2 0 30  PSL (SUTURE)
SUT SILK 2 0 30 PSL (SUTURE) IMPLANT
SUT SILK 2-0 18XBRD TIE 12 (SUTURE) IMPLANT
SUT VIC AB 0 CT1 36 (SUTURE) ×8 IMPLANT
SUT VIC AB 2-0 CT2 27 (SUTURE) IMPLANT
SUT VIC AB 2-0 SH 27 (SUTURE) ×4
SUT VIC AB 2-0 SH 27X BRD (SUTURE) ×4 IMPLANT
SUT VIC AB 3-0 CTX 36 (SUTURE) IMPLANT
SUT VICRYL 0 TIES 12 18 (SUTURE) ×4 IMPLANT
SUT VICRYL 2 0 18  UND BR (SUTURE) ×2
SUT VICRYL 2 0 18 UND BR (SUTURE) ×2 IMPLANT
SYR 20CC LL (SYRINGE) ×8 IMPLANT
SYR CONTROL 10ML LL (SYRINGE) ×4 IMPLANT
TOWEL BLUE STERILE X RAY DET (MISCELLANEOUS) IMPLANT
TOWEL OR 17X26 10 PK STRL BLUE (TOWEL DISPOSABLE) ×4 IMPLANT
TOWEL OR NON WOVEN STRL DISP B (DISPOSABLE) ×4 IMPLANT
TRAY FOLEY W/METER SILVER 14FR (SET/KITS/TRAYS/PACK) ×4 IMPLANT
TRAY FOLEY W/METER SILVER 16FR (SET/KITS/TRAYS/PACK) ×4 IMPLANT
WATER STERILE IRR 1500ML POUR (IV SOLUTION) ×4 IMPLANT

## 2015-06-26 NOTE — Transfer of Care (Signed)
Immediate Anesthesia Transfer of Care Note  Patient: Natalie Ball  Procedure(s) Performed: Procedure(s): EXPLORATORY LAPAROTOMY (N/A) TOTAL HYSTERECTOMY ABDOMINAL (N/A) SALPINGO OOPHORECTOMY (Bilateral)  OMENTECTOMY (N/A) bilateral periaortic lymph node dissection biopsies and peritoneal washings (N/A)  Patient Location: PACU  Anesthesia Type:General  Level of Consciousness: awake, alert  and oriented  Airway & Oxygen Therapy: Patient Spontanous Breathing and Patient connected to face mask oxygen  Post-op Assessment: Report given to RN and Post -op Vital signs reviewed and stable  Post vital signs: Reviewed and stable  Last Vitals:  Filed Vitals:   06/26/15 1150  BP: 116/68  Pulse: 71  Temp: 36.6 C  Resp: 16    Complications: No apparent anesthesia complications

## 2015-06-26 NOTE — Anesthesia Procedure Notes (Signed)
Procedure Name: Intubation Performed by: Gean Maidens Pre-anesthesia Checklist: Patient identified, Emergency Drugs available, Suction available, Patient being monitored and Timeout performed Patient Re-evaluated:Patient Re-evaluated prior to inductionOxygen Delivery Method: Circle system utilized Preoxygenation: Pre-oxygenation with 100% oxygen Intubation Type: IV induction Ventilation: Mask ventilation without difficulty Laryngoscope Size: Mac and 3 Grade View: Grade II Tube type: Oral Tube size: 7.0 mm Number of attempts: 2 Airway Equipment and Method: Stylet Placement Confirmation: ETT inserted through vocal cords under direct vision,  positive ETCO2,  CO2 detector and breath sounds checked- equal and bilateral Secured at: 21 cm Dental Injury: Teeth and Oropharynx as per pre-operative assessment  Difficulty Due To: Difficulty was unanticipated

## 2015-06-26 NOTE — Op Note (Signed)
Preoperative Diagnosis: 1. Right adnexal mass.  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   Procedure(s) Performed: 1. Exploratory laparotomy with total hysterectomy bilateral salpingo-oophorectomy, pelvic and para-aortic lymph node dissection, omentectomy washings and biopsies for ovarian cancer .  Surgeon: Francetta Found.  Skeet Latch, MD., PhD  Assistant Surgeon: Lahoma Crocker, M.D. Assistant:   Specimens: Uterus Cervix, Bilateral tubes / ovaries, bilateral pelvic and para-aortic lymph nodes, peritoneal biopsies, washings and omentum.   Estimated Blood Loss: 200 mL.    Complications: None.   Operative  Findings: A 28 cm mass nonadherence to any surrounding structures. Ascites present in the pelvis.  Surface of the structure with lage blebs and locuation No evidence of intra-abdominal pelvic or peritoneal metastases  Left tube and ovary normal in appearance; 3) normal bilateral pelvic and para-aortic lymph nodes and omentum; no evidence of disease on  small and large bowel palpation. Surgically bbsent appendix. Normal diaphragmatic surface.  Palpably normal pancreas. The large and small bowel were palpated with no identifiable intra or extraluminal disease.  Hyperemia of the peritoneum of the pelvic sidewall.  This represented an optimal cytoreduction with no gross visible disease remaining.    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 The risks, benefits, complications, treatment options, and expected outcomes were discussed with the patient.  The patient concurred with the proposed plan, giving informed consent.   The patient was  identified as Natalie Ball  and the procedure verified as hysterectomy, BSO with possible staging. A Time Out was held and the above information confirmed upon entry to the operating room..  After  induction of anesthesia, the patient was draped and prepped in the usual sterile manner.  She was prepped and draped in the normal sterile fashion in the dorsal lithotomy position in padded Allen stirrups with good attention paid to support of the lower back and lower extremities. Position was adjusted for appropriate support. A Foley catheter was placed to gravity.   A midline vertical incision was made and carried through the subcutaneous tissue to the fascia. The fascial incision was made and extended superiorally. The rectus muscles were separated. The peritoneum was identified and entered. Peritoneal incision was extended longitudinally.  The abdominal cavity was entered sharply and without incident. Alexis and Bookwalter retractors were then placed. A survey of the abdomen and pelvis revealed the above findings, which were significant for a large right sided retroperitoneal mass displacing the colon. The left  tube and ovary were grossly normal in appearance.  Washings were collected.  An attempt was made to decompress the mass.  The was unsuccessful given the multiple loculations.  The mass was delivered through the surgical incision.     After packing the small bowel into the upper abdomen, we began the procedure by entering the  pelvic sidewall just posterior to the right  round ligament. The pararectal space was developed and the retroperitoneum developed up to the level of the common iliac artery.  The course of the ureter was identified with ease. The right IP was then skeletonized, clamped, cut and suture ligated with 0 Vicryl x 2. The rightutero-ovarian ligament was clamped and transected.   The entire right tube and ovary were then sent to pathology  Kelly clamps were placed on both uterine cornua. The round ligaments were identified and transected with the bovie. The anterior peritoneal reflection was incised and  the bladder was dissected off the lower uterine segment. The retroperitoneal  space was explored and the ureters were identified bilaterally.  The left ureter was identified in the retroperitoneum.  The left IP was Ligasured..  The uterine vessels were skeletonized bilaterally , then clamped,and ligasured. Serial pedicles of the cardinal and utero-sacral ligaments were clamped,and ligasured.  Clamps were placed across the vagina.   Entrance was made into the vagina and the cervix amputated. The vaginal cuff was then closed in the standard [facility] fashion with 0 PDS suture.   The pelvis was copiously irrigated. Hemostasis was observed.  Frozen section returned with the diagnosis of ovarian mucinous adenocarcinoma.  Peritoneal biopsies of the pelvis, abdomen, cul de sac and paracolic colic gutters were collected.  A staging procedure was then performed. We further developed the left and right paravesical and pararectal spaces and performed a left-sided pelvic lymph node dissection with the following borders: proximally the bifurcation of the common iliac, distally the circumflex iliac vein, laterally the genitofemoral nerve, the medial border was the superior vesicle artery and the deep border was the obturator nerve. All lymphatic tissue was removed and sent to Pathology. A similar procedure was performed on the contralateral side. Right and left pelvic peritoneal and anterior and posterior pelvic peritoneal biopsies were then performed with the Bovie. The right pelvic gutter was then re-explored after adjustment of the Bookwalter and the line of Toldt incised, deviating the colon medially. A right para-aortic lymph node dissection was then performed with the following borders: distally the common iliac, medially the aorta and IVC, laterally the psoas and the proximal border was just inferior to the renal vasculature. A left para-aortic LND was performed using clips for hemostasis.  All lymphatic tissue was removed and sent to Pathology.    An infragastric omentectomy was performed  using a Ligasure.  Hemostasis was assured.  Exparil was injected into the subcutaneous tissue.    The fascia was reapproximated with 0 looped PDS using a total of two sutures in the standard Smead Jones fashion.. The subcutaneous layer was then irrigated copiously.  The subcutaneous layer was then reapproximated with a running 4-0 Monocryl. Dermabond was place over the incision.  Natalie Ball tolerated the procedure well.   Sponge, lap and needle counts were correct x 2.    The patient had sequential compression devices for VTE prophylaxis and will receive Lovenox postoperatively. The patient was extubated and taken to the recovery room in stable condition.

## 2015-06-26 NOTE — H&P (View-Only) (Signed)
Consult Note: Gyn-Onc  Consult was requested by Dr. Evie Lacks for the evaluation of Natalie Ball 65 y.o. female  CC:  Chief Complaint  Patient presents with  . pelvic mass    New consult    Assessment/Plan:  Natalie Ball  is a 65 y.o.  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  I have personally reviewed the images from the MRI from 06/05/15.  The mass does not appear classic in appearance for ovarian cancer, though I am very concerned for malignancy given her tumor marker elevation. It is possilbe that this is a primary mucinous neoplasm, potentially of GI primary.  We are obtaining a CT chest, abdo/pelvis to better evaluate for metastatic disease. There is no gross peritoneal disease on MRI pelvis.  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  In order to expedite her urge her scheduled her to have surgery with my partner Dr. [name] on June 26 2015. Scars that surgery will likely involve an exploratory laparotomy, total abdominal hysterectomy, bilateral salpingo-oophorectomy, and possible bowel resection. We will mechanically prep her bowels and provider would enter read preoperatively in preparation for this. I discussed the risks of this procedure including  bleeding, infection, damage to internal organs (such as bladder,ureters, bowels), blood clot, reoperation and rehospitalization.  HPI: Natalie Ball is a very pleasant 65 year old woman who is seen in consultation at the request of Dr. Evie Lacks for a complex cystic pelvic and abdominal mass. The patient reports a history of abdominal bloating since December 2016. She also reports the development of hair on her face and lower abdomen in the past 4 months. She saw her gynecologist on 06/04/2015 and reported this feeling of abdominal bloating and distention and some scoliosis ultrasound scan was  performed on 06/05/2015. This showed a large multicystic and septated mass from the right hemipelvis with vascular septations measuring 21 x 14 x 18 cm. There was also traced simple appearing pelvic free fluid. She then underwent an MRI of the pelvis on 06/05/2015 which revealed a grossly normal uterus measuring 8 cm with subserosal fibroids. There was a large complex cystic mass in the anterior pelvis extending into the mid abdomen measuring 24 x 10 x 25 cm. This mass contained numerous internal septations as well as several enhancing soft tissue nodules measuring 2-3 cm in size. 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 Tumor markers were then drawn on 06/05/2015. The CA-125 was elevated at 291.9. The CEA was elevated at 7.1.  The patient has a strong family history for colon cancer on her father's side of the family. Although she is unclear about which relatives specifically had colon cancer and their ages. Her last colonoscopy was a proximally 14 years ago. The patient reports having some altered GI function with intermittent constipation but denies hematochezia or melena or narrowed caliber of stools.   Current Meds:  Outpatient Encounter Prescriptions as of 06/20/2015  Medication Sig  . famotidine (PEPCID) 10 MG tablet Take 10 mg by mouth 2 (two) times daily.  . fluticasone (FLONASE) 50 MCG/ACT nasal spray Place 1 spray into both nostrils daily.  Marland Kitchen ibuprofen (ADVIL,MOTRIN) 200 MG tablet Take 400 mg by mouth every 6 (six) hours as needed.  . loratadine (CLARITIN) 10 MG tablet Take 10 mg by mouth daily as  needed for allergies.  . Papaya Enzyme CHEW Chew by mouth.   No facility-administered encounter medications on file as of 06/20/2015.    Allergy: Not on File  Social Hx:   Social History   Social History  . Marital Status: Widowed    Spouse Name: N/A  . Number of Children: N/A  . Years of Education: N/A   Occupational  History  . Not on file.   Social History Main Topics  . Smoking status: Former Smoker    Quit date: 06/19/1978  . Smokeless tobacco: Not on file  . Alcohol Use: Yes  . Drug Use: No  . Sexual Activity: Not on file   Other Topics Concern  . Not on file   Social History Narrative  . No narrative on file    Past Surgical Hx:  Past Surgical History  Procedure Laterality Date  . Tubal ligation  1974  . Diagnostic laparoscopy  1977  . Rectal fusula  1980    Past Medical Hx:  Past Medical History  Diagnosis Date  . Allergy   . GERD (gastroesophageal reflux disease)     Past Gynecological History:   No LMP recorded.  Family Hx:  Family History  Problem Relation Age of Onset  . Cancer Father   . Cancer Maternal Aunt   . Cancer Maternal Uncle     Review of Systems:  Constitutional  Feels bloated  ENT Normal appearing ears and nares bilaterally Skin/Breast  No rash, sores, jaundice, itching, dryness Cardiovascular  No chest pain, shortness of breath, or edema  Pulmonary  No cough or wheeze.  Gastro Intestinal  No nausea, vomitting, or diarrhoea. No bright red blood per rectum, no abdominal pain, change in bowel movement, or constipation.  Genito Urinary  No frequency, urgency, dysuria, + bloating, no bleeding Musculo Skeletal  No myalgia, arthralgia, joint swelling or pain  Neurologic  No weakness, numbness, change in gait,  Psychology  No depression, anxiety, insomnia.   Vitals:  Blood pressure 130/57, pulse 63, temperature 98.2 F (36.8 C), temperature source Oral, resp. rate 18, height 5\' 6"  (1.676 m), weight 142 lb 8 oz (64.638 kg), SpO2 100 %.  Physical Exam: WD in NAD Neck  Supple NROM, without any enlargements.  Lymph Node Survey No cervical supraclavicular or inguinal adenopathy Cardiovascular  Pulse normal rate, regularity and rhythm. S1 and S2 normal.  Lungs  Clear to auscultation bilateraly, without wheezes/crackles/rhonchi. Good air  movement.  Skin  No rash/lesions/breakdown  Psychiatry  Alert and oriented to person, place, and time  Abdomen  Normoactive bowel sounds, abdomen soft, non-tender and thin without evidence of hernia. Large cystic fullness in the lower abdomen to umbilicus.  Back No CVA tenderness Genito Urinary  Vulva/vagina: Normal external female genitalia.  No lesions. No discharge or bleeding.  Bladder/urethra:  No lesions or masses, well supported bladder  Vagina: grossly normal  Cervix: Normal appearing, no lesions.  Uterus:  Small, mobile, no parametrial involvement or nodularity.  Adnexa: large soft, cystic fullness throughout the pelvis. Predominantly anteriorally. Not palpable in the cul de sac.  Rectal  Good tone, no masses no cul de sac nodularity.  Extremities  No bilateral cyanosis, clubbing or edema.   Donaciano Eva, MD  06/20/2015, 4:45 PM

## 2015-06-26 NOTE — Anesthesia Preprocedure Evaluation (Signed)
Anesthesia Evaluation  Patient identified by MRN, date of birth, ID band Patient awake    Reviewed: Allergy & Precautions, H&P , NPO status , Patient's Chart, lab work & pertinent test results  History of Anesthesia Complications (+) PONV  Airway Mallampati: II   Neck ROM: full    Dental   Pulmonary former smoker,    breath sounds clear to auscultation       Cardiovascular negative cardio ROS   Rhythm:regular Rate:Normal     Neuro/Psych  Headaches,    GI/Hepatic GERD  ,  Endo/Other    Renal/GU      Musculoskeletal   Abdominal   Peds  Hematology   Anesthesia Other Findings   Reproductive/Obstetrics                             Anesthesia Physical Anesthesia Plan  ASA: II  Anesthesia Plan: General   Post-op Pain Management:    Induction: Intravenous  Airway Management Planned: Oral ETT  Additional Equipment:   Intra-op Plan:   Post-operative Plan: Extubation in OR  Informed Consent: I have reviewed the patients History and Physical, chart, labs and discussed the procedure including the risks, benefits and alternatives for the proposed anesthesia with the patient or authorized representative who has indicated his/her understanding and acceptance.     Plan Discussed with: CRNA, Anesthesiologist and Surgeon  Anesthesia Plan Comments:         Anesthesia Quick Evaluation

## 2015-06-26 NOTE — Anesthesia Postprocedure Evaluation (Signed)
Anesthesia Post Note  Patient: Natalie Ball  Procedure(s) Performed: Procedure(s) (LRB): EXPLORATORY LAPAROTOMY (N/A) TOTAL HYSTERECTOMY ABDOMINAL (N/A) SALPINGO OOPHORECTOMY (Bilateral)  OMENTECTOMY (N/A) bilateral periaortic lymph node dissection biopsies and peritoneal washings (N/A)  Patient location during evaluation: PACU Anesthesia Type: General Level of consciousness: awake and alert and patient cooperative Pain management: pain level controlled Vital Signs Assessment: post-procedure vital signs reviewed and stable Respiratory status: spontaneous breathing and respiratory function stable Cardiovascular status: stable Anesthetic complications: no    Last Vitals:  Filed Vitals:   06/26/15 1755 06/26/15 1808  BP: 135/64 134/63  Pulse: 75 76  Temp:  36.6 C  Resp: 13 16    Last Pain:  Filed Vitals:   06/26/15 1817  PainSc: [place]

## 2015-06-26 NOTE — Interval H&P Note (Signed)
History and Physical Interval Note:  06/26/2015 2:12 PM  Natalie Ball  has presented today for surgery, with the diagnosis of PELVIC MASS  The various methods of treatment have been discussed with the patient and family. After consideration of risks, benefits and other options for treatment, the patient has consented to  Procedure(s): EXPLORATORY LAPAROTOMY;POSSIBLE BOWEL RESECTION (N/A) TOTAL HYSTERECTOMY ABDOMINAL (N/A) SALPINGO OOPHORECTOMY (Bilateral) POSSIBLE OMENTECTOMY (N/A) POSSBIBLE DEBULKING (N/A) as a surgical intervention .  The patient's history has been reviewed, patient examined, no change in status, stable for surgery.  I have reviewed the patient's chart and labs.  Questions were answered to the patient's satisfaction.     [place], [facility]

## 2015-06-27 ENCOUNTER — Encounter (HOSPITAL_COMMUNITY): Payer: Self-pay | Admitting: Gynecologic Oncology

## 2015-06-27 LAB — CBC
HEMATOCRIT: 34.4 % — AB (ref 36.0–46.0)
HEMOGLOBIN: 11.3 g/dL — AB (ref 12.0–15.0)
MCH: 32.6 pg (ref 26.0–34.0)
MCHC: 32.8 g/dL (ref 30.0–36.0)
MCV: 99.1 fL (ref 78.0–100.0)
Platelets: 345 10*3/uL (ref 150–400)
RBC: 3.47 MIL/uL — AB (ref 3.87–5.11)
RDW: 13.3 % (ref 11.5–15.5)
WBC: 10.8 10*3/uL — ABNORMAL HIGH (ref 4.0–10.5)

## 2015-06-27 LAB — BASIC METABOLIC PANEL
Anion gap: 8 (ref 5–15)
BUN: 15 mg/dL (ref 6–20)
CHLORIDE: 107 mmol/L (ref 101–111)
CO2: 22 mmol/L (ref 22–32)
Calcium: 8.2 mg/dL — ABNORMAL LOW (ref 8.9–10.3)
Creatinine, Ser: 0.7 mg/dL (ref 0.44–1.00)
GFR calc Af Amer: >60 mL/min (ref >60)
GFR calc non Af Amer: >60 mL/min (ref >60)
Glucose, Bld: 140 mg/dL — ABNORMAL HIGH (ref 65–99)
POTASSIUM: 4.7 mmol/L (ref 3.5–5.1)
SODIUM: 137 mmol/L (ref 135–145)

## 2015-06-27 MED ORDER — ENOXAPARIN (LOVENOX) PATIENT EDUCATION KIT
PACK | Freq: Once | Status: AC
  Administered 2015-06-27: 10:00:00
  Filled 2015-06-27: qty 1

## 2015-06-27 NOTE — Progress Notes (Signed)
1 Day Post-Op Procedure(s) (LRB): EXPLORATORY LAPAROTOMY (N/A) TOTAL HYSTERECTOMY ABDOMINAL (N/A) SALPINGO OOPHORECTOMY (Bilateral)  OMENTECTOMY (N/A) bilateral periaortic lymph node dissection biopsies and peritoneal washings (N/A)  Subjective: Patient reports feeling well this am.  Minimal pain reported.  No nausea or emesis.  Up with assistance.  Waiting for breakfast.  Denies chest pain, dyspnea, passing flatus, or having a bowel movement.  No concerns voiced.      Objective: Vital signs in last 24 hours: Temp:  [97.8 F (36.6 C)-98.7 F (37.1 C)] 98.1 F (36.7 C) (01/13 0540) Pulse Rate:  [63-85] 77 (01/13 0540) Resp:  [13-26] 16 (01/13 0540) BP: (99-136)/(54-71) 119/61 mmHg (01/13 0540) SpO2:  [95 %-100 %] 98 % (01/13 0540) Weight:  [141 lb (63.957 kg)] 141 lb (63.957 kg) (01/12 1150) Last BM Date: 06/26/15  Intake/Output from previous day: 01/12 0701 - 01/13 0700 In: 3900 [I.V.:3850; IV Piggyback:50] Out: 1325 [Urine:1025; Blood:300]  Physical Examination: General: alert, cooperative and no distress Resp: clear to auscultation bilaterally Cardio: regular rate and rhythm, S1, S2 normal, no murmur, click, rub or gallop GI: soft, non-tender; bowel sounds normal; no masses,  no organomegaly and incision: midline incision with dermabond without erythema or drainage Extremities: extremities normal, atraumatic, no cyanosis or edema  Labs: WBC/Hgb/Hct/Plts:  10.8/11.3/34.4/345 (01/13 0535) BUN/Cr/glu/ALT/AST/amyl/lip:  15/0.70/--/--/--/--/-- (01/13 0535)  Assessment: 65 y.o. s/p Procedure(s): EXPLORATORY LAPAROTOMY TOTAL HYSTERECTOMY ABDOMINAL SALPINGO OOPHORECTOMY  OMENTECTOMY bilateral periaortic lymph node dissection biopsies and peritoneal washings: stable Pain:  Pain is well-controlled on PRN medications.  Heme: Hgb 11.3 and Hct 34.4 this am.  Stable.  CV: BP and HR stable post-operatively.  Continue to monitor with ordered vital signs.  GI:  Tolerating po: Yes      GU: Adequate output reported.    FEN: Stable post-operatively.  Prophylaxis: pharmacologic prophylaxis (with any of the following: enoxaparin (Lovenox) 36m SQ 2 hours prior to surgery then every day) and intermittent pneumatic compression boots.  Plan: Diet to regular Saline lock IV if diet tolerated Lovenox teaching kit for discharge Encourage ambulation, IS use, deep breathing, and coughing Continue post-operative plan of care per Dr. JDelsa Sale  LOS: 1 day    Natalie Ball DEAL 06/27/2015, 9:58 AM

## 2015-06-28 MED ORDER — ENOXAPARIN SODIUM 40 MG/0.4ML ~~LOC~~ SOLN: 40.0000 mg | SUBCUTANEOUS | Status: DC

## 2015-06-28 MED ORDER — BISACODYL 10 MG RE SUPP
10.0000 mg | Freq: Once | RECTAL | Status: AC
  Administered 2015-06-28: 10 mg via RECTAL
  Filled 2015-06-28: qty 1

## 2015-06-28 MED ORDER — OXYCODONE-ACETAMINOPHEN 5-325 MG PO TABS: 1.0000 | ORAL_TABLET | Freq: Four times a day (QID) | ORAL | Status: DC | PRN

## 2015-06-28 MED ORDER — ACETAMINOPHEN 500 MG PO TABS
1000.0000 mg | ORAL_TABLET | Freq: Four times a day (QID) | ORAL | Status: DC
  Administered 2015-06-28: 1000 mg via ORAL

## 2015-06-28 NOTE — Discharge Instructions (Signed)
Abdominal Hysterectomy, Care After °Refer to this sheet in the next few weeks. These instructions provide you with information on caring for yourself after your procedure. Your health care provider may also give you more specific instructions. Your treatment has been planned according to current medical practices, but problems sometimes occur. Call your health care provider if you have any problems or questions after your procedure.  °WHAT TO EXPECT AFTER THE PROCEDURE °After your procedure, it is typical to have the following: °· Pain. °· Feeling tired. °· Poor appetite. °· Less interest in sex. °It takes 4-6 weeks to recover from this surgery.  °HOME CARE INSTRUCTIONS  °· Take pain medicines only as directed by your health care provider. Do not take over-the-counter pain medicines without checking with your health care provider first.  °· Change your bandage as directed by your health care provider. °· Return to your health care provider to have your sutures taken out. °· Take showers instead of baths for 2-3 weeks. Ask your health care provider when it is safe to start showering.  °· Do not douche, use tampons, or have sexual intercourse for at least 6 weeks or until your health care provider says you can.   °· Follow your health care provider's advice about exercise, lifting, driving, and general activities. °· Get plenty of rest and sleep.   °· Do not lift anything heavier than a gallon of milk (about 10 lb [4.5 kg]) for the first month after surgery. °· You can resume your normal diet if your health care provider says it is okay.   °· Do not drink alcohol until your health care provider says you can.   °· If you are constipated, ask your health care provider if you can take a mild laxative. °· Eating foods high in fiber may also help with constipation. Eat plenty of raw fruits and vegetables, whole grains, and beans. °· Drink enough fluids to keep your urine clear or pale yellow.   °· Try to have someone at  home with you for the first 1-2 weeks to help around the house. °· Keep all follow-up appointments. °SEEK MEDICAL CARE IF:  °· You have chills or fever. °· You have swelling, redness, or pain in the area of your incision that is getting worse.   °· You have pus coming from the incision.   °· You notice a bad smell coming from the incision or bandage.   °· Your incision breaks open.   °· You feel dizzy or light-headed.   °· You have pain or bleeding when you urinate.   °· You have persistent diarrhea.   °· You have persistent nausea and vomiting.   °· You have abnormal vaginal discharge.   °· You have a rash.   °· You have any type of abnormal reaction or develop an allergy to your medicine.   °· Your pain medicine is not helping.   °SEEK IMMEDIATE MEDICAL CARE IF:  °· You have a fever and your symptoms suddenly get worse. °· You have severe abdominal pain. °· You have chest pain. °· You have shortness of breath. °· You faint. °· You have pain, swelling, or redness of your leg. °· You have heavy vaginal bleeding with blood clots. °MAKE SURE YOU: °· Understand these instructions. °· Will watch your condition. °· Will get help right away if you are not doing well or get worse. °  °This information is not intended to replace advice given to you by your health care provider. Make sure you discuss any questions you have with your health care provider. °  °Document   Released: 12/18/2004 Document Revised: 06/21/2014 Document Reviewed: 03/23/2013 °Elsevier Interactive Patient Education ©2016 [employer] ° °

## 2015-06-28 NOTE — Progress Notes (Signed)
Patient able to verbalize how to give self lovenox injections, demonstrate sites and site rotation. Watched patient video on lovenox. All discharge instructions discussed jwith patient until no further questions ask.

## 2015-06-29 NOTE — Discharge Summary (Signed)
Physician Discharge Summary  Patient ID: Natalie Ball MRN: [id number] DOB/AGE: [birth year] 65 y.o.  Admit date: 06/26/2015 Discharge date: 06/29/2015  Admission Diagnoses: Pelvic mass in female  Discharge Diagnoses:  Principal Problem:   Pelvic mass in female Active Problems:   Pelvic mass   Discharged Condition: good  Hospital Course: On 06/26/2015, the patient underwent the following: Procedure(s): EXPLORATORY LAPAROTOMY TOTAL HYSTERECTOMY ABDOMINAL SALPINGO OOPHORECTOMY  OMENTECTOMY bilateral periaortic lymph node dissection biopsies and peritoneal washings.   The postoperative course was uneventful.  She was discharged to home on postoperative day 2 tolerating a regular diet.  Consults: None  Significant Diagnostic Studies: None  Treatments: surgery: see above  Discharge Exam: Blood pressure 116/52, pulse 68, temperature 98.6 F (37 C), temperature source Oral, resp. rate 16, height 5\' 6"  (1.676 m), weight 141 lb (63.957 kg), SpO2 98 %. General appearance: alert GI: soft, non-tender; bowel sounds normal; no masses,  no organomegaly Extremities: extremities normal, atraumatic, no cyanosis or edema and Homans sign is negative, no sign of DVT Incision/Wound:  Disposition: 01-Home or Self Care  Discharge Instructions    Activity as tolerated - No restrictions    Complete by:  As directed      Call MD for:  extreme fatigue    Complete by:  As directed      Call MD for:  persistant dizziness or light-headedness    Complete by:  As directed      Call MD for:  persistant nausea and vomiting    Complete by:  As directed      Call MD for:  redness, tenderness, or signs of infection (pain, swelling, redness, odor or green/yellow discharge around incision site)    Complete by:  As directed      Call MD for:  severe uncontrolled pain    Complete by:  As directed      Call MD for:  temperature >100.4    Complete by:  As directed      Diet - low sodium heart healthy     Complete by:  As directed      Diet Carb Modified    Complete by:  As directed      Diet general    Complete by:  As directed      Discharge instructions    Complete by:  As directed   Return to work: 6 weeks  Activity: 1. Be up and out of the bed during the day.  Take a nap if needed.  You may walk up steps but be careful and use the hand rail.  Stair climbing will tire you more than you think, you may need to stop part way and rest.   2. No lifting or straining for 6 weeks.  3. No driving for 1-2 weeks.  Do Not drive if you are taking narcotic pain medicine.  4. Shower daily.  Use soap and water on your incision and pat dry; don't rub.   5. No sexual activity and nothing in the vagina for 4 weeks.  Diet: 1. Low sodium Heart Healthy Diet is recommended.  2. It is safe to use a laxative if you have difficulty moving your bowels.   Wound Care: 1. Keep clean and dry.  Shower daily.  Reasons to call the Doctor:  Fever - Oral temperature greater than 100.4 degrees Fahrenheit Foul-smelling vaginal discharge Difficulty urinating Nausea and vomiting Increased pain at the site of the incision that is unrelieved with pain medicine. Difficulty breathing with  or without chest pain New calf pain especially if only on one side Sudden, continuing increased vaginal bleeding with or without clots.   Follow-up: 1. See Everitt Amber in 4 weeks.  Contacts: For questions or concerns you should contact:  Dr. Everitt Amber at [phone]  or at [place]     Discharge wound care:    Complete by:  As directed   Keep clean and dry     Driving Restrictions    Complete by:  As directed   No driving for 1- 2 weeks     Increase activity slowly    Complete by:  As directed      Lifting restrictions    Complete by:  As directed   No lifting > 5 lbs for 6 weeks     May shower / Bathe    Complete by:  As directed   No tub baths for 6 weeks     May walk up steps    Complete by:  As  directed      Sexual Activity Restrictions    Complete by:  As directed   No intercourse for 6 - 8 weeks            Medication List    TAKE these medications        CLARITIN 10 MG tablet  Generic drug:  loratadine  Take 10 mg by mouth daily as needed for allergies.     enoxaparin 40 MG/0.4ML injection  Commonly known as:  LOVENOX  Inject 0.4 mLs (40 mg total) into the skin daily.     famotidine 20 MG tablet  Commonly known as:  PEPCID  Take 20 mg by mouth every morning.     fluticasone 50 MCG/ACT nasal spray  Commonly known as:  FLONASE  Place 1 spray into both nostrils daily.     ibuprofen 200 MG tablet  Commonly known as:  ADVIL,MOTRIN  Take 400 mg by mouth every 6 (six) hours as needed for headache or moderate pain.     oxyCODONE-acetaminophen 5-325 MG tablet  Commonly known as:  PERCOCET/ROXICET  Take 1-2 tablets by mouth every 6 (six) hours as needed for severe pain.     Papaya Enzyme Chew  Chew 1 tablet by mouth daily. For digestive health           Follow-up Information    Follow up with Donaciano Eva, MD. Schedule an appointment as soon as possible for a visit in 1 month.   Specialty:  Obstetrics and Gynecology   Why:  postop f/u   Contact information:   [facility] [address] [phone]       Signed: Agnes Lawrence 06/29/2015, 10:14 AM

## 2015-07-03 ENCOUNTER — Other Ambulatory Visit: Payer: Self-pay | Admitting: Gynecologic Oncology

## 2015-07-03 ENCOUNTER — Telehealth: Payer: Self-pay | Admitting: Gynecologic Oncology

## 2015-07-03 DIAGNOSIS — C801 Malignant (primary) neoplasm, unspecified: Secondary | ICD-10-CM

## 2015-07-03 NOTE — Progress Notes (Signed)
Patient informed of pathology results and Dr. Leone Brand recommendations for chemotherapy.  FOLFOX  versus Taxol/Carbo per Dr. Skeet Latch.  Patient stating she would like to have treatment close to home in [place].  Advised we would arrange for her to meet with Dr. Whitney Muse at [facility].  Patient doing well post-operatively.  Tolerating diet with no nausea or emesis.  Ambulating without difficulty.  Bowels and bladder functioning without difficulty.  No concerns voiced.  Post-op follow up moved from 2/6 to 1/30.  Advised to call for any questions or concerns.

## 2015-07-03 NOTE — Telephone Encounter (Signed)
See orders only note.  Patient informed of path results and need for chemotherapy.

## 2015-07-10 ENCOUNTER — Encounter (HOSPITAL_COMMUNITY): Payer: Self-pay | Admitting: Lab

## 2015-07-10 ENCOUNTER — Encounter (HOSPITAL_COMMUNITY): Payer: Self-pay | Admitting: Oncology

## 2015-07-10 ENCOUNTER — Encounter (HOSPITAL_COMMUNITY): Payer: BLUE CROSS/BLUE SHIELD | Attending: Hematology & Oncology | Admitting: Oncology

## 2015-07-10 ENCOUNTER — Ambulatory Visit (HOSPITAL_COMMUNITY): Payer: BLUE CROSS/BLUE SHIELD | Admitting: Hematology & Oncology

## 2015-07-10 VITALS — BP 118/77 | HR 66 | Temp 98.2°F | Resp 16 | Ht 65.0 in | Wt 127.2 lb

## 2015-07-10 DIAGNOSIS — C801 Malignant (primary) neoplasm, unspecified: Secondary | ICD-10-CM | POA: Diagnosis not present

## 2015-07-10 HISTORY — DX: Malignant (primary) neoplasm, unspecified: C80.1

## 2015-07-10 LAB — CBC WITH DIFFERENTIAL/PLATELET
Basophils Absolute: 0.1 10*3/uL (ref 0.0–0.1)
Basophils Relative: 1 %
Eosinophils Absolute: 0.4 10*3/uL (ref 0.0–0.7)
Eosinophils Relative: 4 %
HCT: 41.9 % (ref 36.0–46.0)
HEMOGLOBIN: 13.8 g/dL (ref 12.0–15.0)
LYMPHS ABS: 1.8 10*3/uL (ref 0.7–4.0)
LYMPHS PCT: 18 %
MCH: 31.9 pg (ref 26.0–34.0)
MCHC: 32.9 g/dL (ref 30.0–36.0)
MCV: 97 fL (ref 78.0–100.0)
Monocytes Absolute: 0.5 10*3/uL (ref 0.1–1.0)
Monocytes Relative: 5 %
NEUTROS PCT: 72 %
Neutro Abs: 7.3 10*3/uL (ref 1.7–7.7)
Platelets: 479 10*3/uL — ABNORMAL HIGH (ref 150–400)
RBC: 4.32 MIL/uL (ref 3.87–5.11)
RDW: 12.9 % (ref 11.5–15.5)
WBC: 10 10*3/uL (ref 4.0–10.5)

## 2015-07-10 LAB — COMPREHENSIVE METABOLIC PANEL
ALK PHOS: 60 U/L (ref 38–126)
ALT: 35 U/L (ref 14–54)
AST: 33 U/L (ref 15–41)
Albumin: 4.5 g/dL (ref 3.5–5.0)
Anion gap: 14 (ref 5–15)
BUN: 17 mg/dL (ref 6–20)
CALCIUM: 10.3 mg/dL (ref 8.9–10.3)
CO2: 26 mmol/L (ref 22–32)
CREATININE: 0.74 mg/dL (ref 0.44–1.00)
Chloride: 101 mmol/L (ref 101–111)
Glucose, Bld: 127 mg/dL — ABNORMAL HIGH (ref 65–99)
Potassium: 4.6 mmol/L (ref 3.5–5.1)
Sodium: 141 mmol/L (ref 135–145)
Total Bilirubin: 0.4 mg/dL (ref 0.3–1.2)
Total Protein: 7.8 g/dL (ref 6.5–8.1)

## 2015-07-10 NOTE — Progress Notes (Signed)
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  HISTORY OF PRESENT ILLNESS:   Natalie Ball is a 65 yo white American female with a past medical history significant for seasonal allergies and GERD who is referred to [facility] with newly diagnosed mucinous adenocarcinoma on exploratory laparotomy and TAH-BSO on 06/26/2015 by Dr. [name] (GYN ONC) after being referred to Dr. Everitt Amber ([place]) by Dr. Evie Lacks [facility]) for a large abdominopelvic mass likely of right ovary.  It is documented in Dr. Serita Grit note that the patient had an elevated CA 125 and CEA, The CA-125 was elevated at 291.9. The CEA was elevated at 7.1. (per Dr. Serita Grit note)    Mucinous adenocarcinoma ([place])   06/24/2015 Imaging CT CAP- Complex cystic central pelvic mass remains suspicious for cystic ovarian carcinoma, most likely arising from the right ovary. Prominent right gonadal vasculature. Tiny hypodensity in the dome of the liver is too small to characterize, but stable.   06/26/2015 Pathology Results Adnexa - ovary +/- tube, neoplastic, right - MUCINOUS ADENOCARCINOMA, 13 CM. - OVARIAN SURFACE NOT INVOLVED BY TUMOR. - UNREMARKABLE FALLOPIAN TUBE WITH NO ENDOMETRIOSIS OR MALIGNANCY.   06/26/2015 Procedure Exploratory laparotomy with total hysterectomy bilateral salpingo-oophorectomy, pelvic and para-aortic lymph node dissection, omentectomy washings and biopsies for ovarian cancer by Dr. [name]   06/27/2015 Pathology Results PERITONEAL WASHING(SPECIMEN 1 OF 1 COLLECTED 06/27/15): ATYPICAL CELLS. Immunohistochemistry calretinin, cytokeratin 5/6, estrogen receptor, progesterone receptor and WT-1 is  performed. The morphology and immunophenotype favor reactive mesothelial cells   07/10/2015 Initial Diagnosis Mucinous adenocarcinoma ([place])    I personally reviewed and went over laboratory results with the patient.  The results are noted within this dictation.  I personally reviewed and went over radiographic studies with the patient.  The results are noted within this dictation.  CT CAP shows a large right mass arising from the ovary and a tiny hypodensity in the dome of the liver that is too small to characterize.  Chart reviewed.  She reports that she reported to her primary care provider for UTI symptoms.  She was started on an antibiotic and advised to return if not better.  She did not improve and therefore, the reported to her Gynecologist, Dr. Evie Lacks.  She notes that on exam, he desired further testing including an [place] and MRI at [facility].  She was subsequently referred to Dr. Denman George.  She saw Dr. Denman George on 06/20/2015.  Her note is reviewed in detail.  CT CAP was ordered and results are noted above and below.  Based upon the patient imaging, she underwent surgical intervention by Dr. [name] on 06/26/2015 (Exploratory laparotomy with total hysterectomy bilateral salpingo-oophorectomy, pelvic and para-aortic lymph node dissection, omentectomy washings and biopsies for ovarian cancer).  She denies any complaints today.  She denies any headaches, dizziness, double vision, fevers, chills, night sweats, nausea, vomiting, diarrhea, constipation, abdominal pain, chest pain, SOB, blood in stool, black tarry stool, urinary/pelvis pain, hematuria, etc.   PAST MEDICAL HISTORY:   Past Medical History  Diagnosis Date  . Allergy   . GERD (gastroesophageal reflux disease)   . Complication of anesthesia   . PONV (postoperative nausea and vomiting)   .  Wears glasses   . Tinnitus   . Vertigo   . Pneumonia   . History of bronchitis   . Urinary urgency   . Headache   . Mucinous adenocarcinoma  ([place]) 07/10/2015    ALLERGIES: Allergies  Allergen Reactions  . Sulfur Other (See Comments)    May have caused blood in urine - unknown      MEDICATIONS: I have reviewed the patient's current medications.    Current Outpatient Prescriptions on File Prior to Visit  Medication Sig Dispense Refill  . enoxaparin (LOVENOX) 40 MG/0.4ML injection Inject 0.4 mLs (40 mg total) into the skin daily. 26 Syringe 0  . famotidine (PEPCID) 20 MG tablet Take 20 mg by mouth every morning.    . fluticasone (FLONASE) 50 MCG/ACT nasal spray Place 1 spray into both nostrils daily.    Marland Kitchen loratadine (CLARITIN) 10 MG tablet Take 10 mg by mouth daily as needed for allergies.    . Papaya Enzyme CHEW Chew 1 tablet by mouth daily. For digestive health    . ibuprofen (ADVIL,MOTRIN) 200 MG tablet Take 400 mg by mouth every 6 (six) hours as needed for headache or moderate pain. Reported on 07/10/2015    . oxyCODONE-acetaminophen (PERCOCET/ROXICET) 5-325 MG tablet Take 1-2 tablets by mouth every 6 (six) hours as needed for severe pain. (Patient not taking: Reported on 07/10/2015) 30 tablet 0   No current facility-administered medications on file prior to visit.     PAST SURGICAL HISTORY Past Surgical History  Procedure Laterality Date  . Tubal ligation  1974  . Diagnostic laparoscopy  1977  . Rectal fusula  1980  . Appendectomy    . Laparotomy N/A 06/26/2015    Procedure: EXPLORATORY LAPAROTOMY;  Surgeon: [name], MD;  Location: [facility];  Service: Gynecology;  Laterality: N/A;  . Abdominal hysterectomy N/A 06/26/2015    Procedure: TOTAL HYSTERECTOMY ABDOMINAL;  Surgeon: [name], MD;  Location: [facility];  Service: Gynecology;  Laterality: N/A;  . Salpingoophorectomy Bilateral 06/26/2015    Procedure: SALPINGO OOPHORECTOMY;  Surgeon: [name], MD;  Location: [facility];  Service: Gynecology;  Laterality: Bilateral;  . Omentectomy N/A 06/26/2015    Procedure:  OMENTECTOMY;  Surgeon: [name], MD;   Location: [facility];  Service: Gynecology;  Laterality: N/A;  . Debulking N/A 06/26/2015    Procedure: bilateral periaortic lymph node dissection biopsies and peritoneal washings;  Surgeon: [name], MD;  Location: [facility];  Service: Gynecology;  Laterality: N/A;    FAMILY HISTORY: Family History  Problem Relation Age of Onset  . Cancer Father   . Cancer Maternal Aunt   . Cancer Maternal Uncle    Mother is alive at the age of 41 with heart disease, hypothyroidism, and valgus malformation of her ankles bilaterally inhibiting her ability to ambulate well. Father is 34 years old and alive with HTN and a mild form of dementia.  Sister is 25 yo with GERD Sister is 50 yo who was estranged from the patient until most recently. Sister 56 yo who has some sort of mental disorder requiring medications. She has 1 daughter who is 63 years old with DM and hypothyroidism. She has 1 granddaughter who is 6 yo who is overweight, but otherwise healthy.  She is in college in [place], [place].  Family Cancer history: Aunt- ovarian and colon cancer who was treated with surgery and other treatment interventions.  She is a cancer survivor. Aunt- Ovarian/cervical cancer, pancreatic cancer, and lung cancer who was treated and  is a cancer survivor. Uncle- pancreatic cancer, deceased in his 84's from his cancer Cousin- Ovarian cancer, deceased in her 35-50's Cousin- diagnosed with ovarian cancer but at time of surgery, no malignancy identified. Cousin- cancer of unknown type in his 50's.  SOCIAL HISTORY:  reports that she quit smoking about 27 years ago. Her smoking use included Cigarettes. She has a 19 pack-year smoking history. She has never used smokeless tobacco. She reports that she drinks alcohol. She reports that she does not use illicit drugs.  She reports that prior to her illness, she enjoyed an intermittent beer or marguirtia.  She denies being a heavy drinker of EtOH.  She notes that she is religious and  she associates herselft with [place].  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  She is widowed.  PERFORMANCE STATUS: The patient's performance status is 0 - Asymptomatic  PHYSICAL EXAM: Most Recent Vital Signs: Blood pressure 118/77, pulse 66, temperature 98.2 F (36.8 C), temperature source Oral, resp. rate 16, height 5\' 5"  (1.651 m), weight 127 lb 3.2 oz (57.698 kg), SpO2 98 %. General appearance: alert, cooperative, appears stated age, no distress and unaccompanied Head: Normocephalic, without obvious abnormality, atraumatic Eyes: negative findings: lids and lashes normal, conjunctivae and sclerae normal, corneas clear and pupils equal, round, reactive to light and accomodation Throat: lips, mucosa, and tongue normal; teeth and gums normal Neck: no adenopathy, supple, symmetrical, trachea midline and thyroid not enlarged, symmetric, no tenderness/mass/nodules Lungs: clear to auscultation bilaterally and normal percussion bilaterally Heart: regular rate and rhythm, S1, S2 normal, no murmur, click, rub or gallop Abdomen: soft, non-tender; bowel sounds normal; no masses,  no organomegaly and sugical site is healing nicely without any discharge, erythema, and pain. Extremities: extremities normal, atraumatic, no cyanosis or edema, Homans sign is negative, no sign of DVT and no edema, redness or tenderness in the calves or thighs Pulses: 2+ and symmetric Skin: Skin color, texture, turgor normal. No rashes or lesions Lymph nodes: Cervical, supraclavicular, and axillary nodes normal. Neurologic: Alert and oriented X 3, normal strength and tone. Normal symmetric reflexes. Normal coordination and gait  LABORATORY DATA:  CBC    Component Value Date/Time   WBC 10.0 07/10/2015 1605   RBC 4.32 07/10/2015 1605   HGB 13.8 07/10/2015 1605   HCT 41.9 07/10/2015 1605   PLT 479* 07/10/2015 1605   MCV 97.0 07/10/2015 1605   MCH 31.9 07/10/2015 1605   MCHC 32.9 07/10/2015  1605   RDW 12.9 07/10/2015 1605   LYMPHSABS 1.8 07/10/2015 1605   MONOABS 0.5 07/10/2015 1605   EOSABS 0.4 07/10/2015 1605   BASOSABS 0.1 07/10/2015 1605      Chemistry      Component Value Date/Time   NA 141 07/10/2015 1605   NA 141 06/20/2015 1121   K 4.6 07/10/2015 1605   K 4.1 06/20/2015 1121   CL 101 07/10/2015 1605   CO2 26 07/10/2015 1605   CO2 24 06/20/2015 1121   BUN 17 07/10/2015 1605   BUN 14.7 06/20/2015 1121   CREATININE 0.74 07/10/2015 1605   CREATININE 0.8 06/20/2015 1121      Component Value Date/Time   CALCIUM 10.3 07/10/2015 1605   CALCIUM 9.2 06/20/2015 1121   ALKPHOS 60 07/10/2015 1605   AST 33 07/10/2015 1605   ALT 35 07/10/2015 1605   BILITOT 0.4 07/10/2015 1605     No results found for: CEA No results found for: CA125   RADIOGRAPHY:  CLINICAL DATA: Cystic ovarian mass.  EXAM: CT CHEST, ABDOMEN, AND PELVIS WITH CONTRAST  TECHNIQUE: Multidetector CT imaging of the chest, abdomen and pelvis was performed following the standard protocol during bolus administration of intravenous contrast.  CONTRAST: 170mL OMNIPAQUE IOHEXOL 300 MG/ML SOLN  COMPARISON: Pelvic MRI from 06/12/2015. CT chest from 04/07/2015.  FINDINGS: CT CHEST FINDINGS  Mediastinum/Lymph Nodes: There is no axillary lymphadenopathy. No mediastinal lymphadenopathy. There is no hilar lymphadenopathy. The heart size is normal. No pericardial effusion. The esophagus has normal imaging features. Small hiatal hernia noted.  Lungs/Pleura: No focal airspace consolidation. No pulmonary edema or pleural effusion. 4 mm left lower lobe pulmonary nodule (image 37 series 4) is stable since 04/07/2015. 3 mm right middle lobe nodule on the same image is also stable.  Musculoskeletal: Bone windows reveal no worrisome lytic or sclerotic osseous lesions.  CT ABDOMEN PELVIS FINDINGS  Hepatobiliary: 5 mm hypodensity in the dome of the liver is stable since 04/07/2015.  Geographic low attenuation of the liver parenchyma is compatible with fatty deposition. There is no evidence for gallstones, gallbladder wall thickening, or pericholecystic fluid. No intrahepatic or extrahepatic biliary dilation.  Pancreas: No focal mass lesion. No dilatation of the main duct. No intraparenchymal cyst. No peripancreatic edema.  Spleen: No splenomegaly. No focal mass lesion.  Adrenals/Urinary Tract: No adrenal nodule or mass. 7 mm low-density lesion extreme upper pole right kidney too small to characterize but likely a tiny cyst. Left kidney unremarkable. No evidence for hydroureter. Bladder is decompressed.  Stomach/Bowel: Small hiatal hernia. Stomach otherwise unremarkable. Duodenum is normally positioned as is the ligament of Treitz. No small bowel wall thickening. No small bowel dilatation. The terminal ileum is normal. The appendix is not visualized, but there is no edema or inflammation in the region of the cecum. Diverticuli are seen scattered along the entire length of the colon without CT findings of diverticulitis.  Vascular/Lymphatic: No abdominal aortic aneurysm. No abdominal atherosclerotic calcification. There is no gastrohepatic or hepatoduodenal ligament lymphadenopathy. No intraperitoneal or retroperitoneal lymphadenopy. No pelvic sidewall lymphadenopathy.  Reproductive: Uterus is unremarkable aside from displacement by the large pelvic mass. 23.4 x 11.6 x 25.8 cm multiloculated complex cystic lesion is identified in the anterior aspect of the central pelvis. This mass appears to be distinct from both the uterus and left ovary in the right gonadal vasculature tracks into the region of this mass. Imaging features are highly suspicious for cystic ovarian neoplasm.  Other: Trace intraperitoneal free fluid is seen in the cul-de-sac and adjacent to the liver tip.  Musculoskeletal: Bone windows reveal no worrisome lytic or sclerotic osseous  lesions.  IMPRESSION: 1. Complex cystic central pelvic mass as seen previously. 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 2. Prominent right gonadal vasculature. 3. Small hiatal hernia. 4. Tiny hypodensity in the dome of the liver is too small to characterize, but is unchanged since 04/07/2015. 5. Trace intraperitoneal free fluid including adjacent to the right liver and in the central pelvis.   Electronically Signed  By: Misty Stanley M.D.  On: 06/24/2015 17:17      PATHOLOGY:    Diagnosis 1. Adnexa - ovary +/- tube, neoplastic, right - MUCINOUS ADENOCARCINOMA, 13 CM. - OVARIAN SURFACE NOT INVOLVED BY TUMOR. - UNREMARKABLE FALLOPIAN TUBE WITH NO ENDOMETRIOSIS OR MALIGNANCY. 2. Uterus and cervix, left fallopian tube and ovary - CERVIX: NABOTHIAN CYST. - ENDOMETRIUM: INACTIVE. NO EVIDENCE OF HYPERPLASIA OR MALIGNANCY. - MYOMETRIUM: ADENOMYOSIS. NO EVIDENCE OF MALIGNANCY. -  UTERINE SEROSA: FIBROUS ADHESIONS AND SUBSEROSAL LEIOMYOMA. NO ENDOMETRIOSIS OR MALIGNANCY. - LEFT OVARY: UNREMARKABLE. NO ENDOMETRIOSIS OR MALIGNANCY. - LEFT FALLOPIAN TUBE: SALPINGITIS ISTHMICA NODOSA. NO ENDOMETRIOSIS OR MALIGANNCY. 3. Soft tissue, biopsy, right pelvic sidewall - ADHESIONS WITH HEMORRHAGE AND MESOTHELIAL HYPERPLASIA. - NO EVIDENCE OF MALIGNANCY. 4. Soft tissue, biopsy, left pelvic sidewall - ADHESIONS WITH HEMORRHAGE AND MESOTHELIAL HYPERPLASIA. - NO EVIDENCE OF MALIGNANCY. 5. Soft tissue, biopsy, sigmoid epiploic - ADHESIONS WITH HEMORRHAGE AND MESOTHELIAL HYPERPLASIA. - NO EVIDENCE OF MALIGNANCY. 6. Cul-de-sac biopsy, anterior - MICROSCOPIC ATYPICAL GLANDULAR FRAGMENT. - SEE MICROSCOPIC DESCRIPTION. 7. Cul-de-sac biopsy, posterior - BENIGN PERITONEUM. - NO EVIDENCE OF MALIGNANCY. 8. Lymph nodes, regional resection, right pelvic - FIVE BENIGN LYMPH NODES (0/5). 9. Lymph nodes, regional resection, left  pelvic - SIX BENIGN LYMPH NODES (0/6). 1 of 4 FINAL for Dulude, Brielyn [phone]) Diagnosis(continued) 10. Lymph node, biopsy, right para aortic - ONE BENIGN LYMPH NODE (0/1). 11. Lymph node, biopsy, left para aortic - TWO BENIGN LYMPH NODES (0/2). 12. Peritoneum, biopsy, left - ADHESIONS WITH HEMORRHAGE AND MESOTHELIAL HYPERPLASIA. - NO EVIDENCE OF MALIGNANCY. 13. Peritoneum, biopsy, right - ADHESIONS WITH HEMORRHAGE AND MESOTHELIAL HYPERPLASIA. - NO EVIDENCE OF MALIGNANCY. 14. Omentum, resection for tumor - BENIGN ADIPOSE TISSUE CONSISTENT WITH OMENTUM WITH FOCAL ADHESIONS AND MESOTHELIAL HYPERPLASIA. - NO EVIDENCE OF MALIGNANCY. Microscopic Comment 1. OVARY Specimen(s): Right ovary and fallopian tube, uterus with left ovary and fallopian tube, pelvic and peritoneal biopsies, right and left pelvic and periaortic lymph nodes. Procedure: (including lymph node sampling) Right salpingo-oophorectomy with hysterectomy, lymph node sampling and peritoneal staging biopsies. Primary tumor site (including laterality): Right ovary. Ovarian surface involvement: No Ovarian capsule intact without fragmentation: Yes Maximum tumor size (cm): 13 cm Histologic type: Mucinous adenocarcinoma. Grade: 2 Peritoneal implants: (specify invasive or non-invasive): See comment. Pelvic extension (list additional structures on separate lines and if involved): See comment. Lymph nodes: number examined 14; number positive 0 TNM code: pT1a, pN0, pMX, see comment FIGO Stage (based on pathologic findings, needs clinical correlation): IA, see comment. Comments: The tumor in the right ovary is a 13 cm mucinous adenocarcinoma without surface ovarian involvement. There is a microscopic atypical glandular fragment in the anterior cul de sac biopsy (part 6), which is adjacent to the surface of the adhesions and adipose tissue. No other pelvic or abdominal peritoneal biopsies with adhesions are involved by tumor and  while a microscopic focus of extra-ovarian involvement cannot be ruled out, this more likely in my opinion represents an artifactual displaced atypical glandular fragment. Multiple additional levels are examined and the findings are similar. (JDP:ecj 06/30/2015) Claudette Laws MD Pathologist, Electronic Signature (Case signed 07/01/2015)     Diagnosis PERITONEAL WASHING(SPECIMEN 1 OF 1 COLLECTED 06/27/15): ATYPICAL CELLS. SEE COMMENT. Claudette Laws MD Pathologist, Electronic Signature (Case signed 07/01/2015) Specimen Clinical Information pelvic mass Source Peritoneal Washing, (specimen 1 of 1 collected 06/26/15) Gross Specimen: Received is/are 80 cc's of red fluid. (BS:bs) Prepared: # Smears: 0 # Concentration Technique Slides (i.e. ThinPrep): 1 # Cell Block: 1 Additional Studies: n/a Comment Comment: Immunohistochemistry calretinin, cytokeratin 5/6, estrogen receptor, progesterone receptor and WT-1 is performed. The morphology and immunophenotype favor reactive mestothelial cells.    ASSESSMENT/PLAN:   Mucinous adenocarcinoma ([facility]) Mucinous adenocarcinoma measuring 13 cm without right ovarian surface involvement, S/P exploratory laparotomy with TAH-BSO by Dr. [name] on 06/26/2015 after findings a large abdominopelvic mass by Gynecologist, Dr. Evie Lacks.  It is dictated that the patient had a pre-operative elevation of CA 125 and CEA.The  CA-125 was elevated at 291.9. The CEA was elevated at 7.1.   Labs today: CBC diff, CMET, CA 125, CEA  According to pathologic staging, she has a Stage IA malignancy and NCCN guidelines recommends observation only.  HOWEVER, cause was discussed amongst tumor board and during surgery, Dr. Skeet Latch was concerned that she had a T1c tumor and the consensus of that conference was the err on the side of caution and recommend systemic chemotherapy.  We were not privy to this information at the time of her consultation today.  Therefore, I will call the  patient with this information and see if she wishes to return tomorrow to review her treatment options. In addition, we will have her tumor markers available, if they remain elevated this is further confirmation that she should proceed with chemotherapy as per tumor board recommendations.  We will refer her to Dr. Britta Mccreedy (her request) for GI consultation and work-up.  She needs screening colonoscopy as she is overdue, in addition to her newly diagnosed mucinous adenocarcinoma.    I collected her family history and I will refer her to genetic counseling at [facility].  We will see her back sooner than previously scheduled.  All questions were answered. The patient knows to call the clinic with any problems, questions or concerns. We can certainly see the patient much sooner if necessary.  This note is electronically signed [id number] Cyril Mourning, MD  07/10/2015 7:57 PM

## 2015-07-10 NOTE — Progress Notes (Signed)
Referral sent to Dr Britta Mccreedy, GI, records faxed on 1/26.  Office will call patient with appt

## 2015-07-10 NOTE — Patient Instructions (Signed)
..  [place] at [facility] Discharge Instructions  RECOMMENDATIONS MADE BY THE CONSULTANT AND ANY TEST RESULTS WILL BE SENT TO YOUR REFERRING PHYSICIAN.  Exam by Caroleen Hamman PA-C Refer to genetic counselor  Refer to Dr. Britta Mccreedy   Return in 3 months   Thank you for choosing [place] at [facility] to provide your oncology and hematology care.  To afford each patient quality time with our provider, please arrive at least 15 minutes before your scheduled appointment time.   Beginning January 23rd 2017 lab work for the [facility] will be done in the  Main lab at [facility] on 1st floor. If you have a lab appointment with the [facility] please come in thru the  Main Entrance and check in at the main information desk  You need to re-schedule your appointment should you arrive 10 or more minutes late.  We strive to give you quality time with our providers, and arriving late affects you and other patients whose appointments are after yours.  Also, if you no show three or more times for appointments you may be dismissed from the clinic at the providers discretion.     Again, thank you for choosing [facility].  Our hope is that these requests will decrease the amount of time that you wait before being seen by our physicians.       _____________________________________________________________  Should you have questions after your visit to [facility], please contact our office at [phone] between the hours of 8:30 a.m. and 4:30 p.m.  Voicemails left after 4:30 p.m. will not be returned until the following business day.  For prescription refill requests, have your pharmacy contact our office.

## 2015-07-10 NOTE — Progress Notes (Signed)
.  Natalie Ball reason for visit today are for labs as scheduled per MD orders.  Venipuncture performed with a 23 gauge butterfly needle to L Antecubital.  Natalie Ball tolerated venipuncture well and without incident; questions were answered and patient was discharged.

## 2015-07-10 NOTE — Assessment & Plan Note (Addendum)
Mucinous adenocarcinoma measuring 13 cm without right ovarian surface involvement, S/P exploratory laparotomy with TAH-BSO by Dr. [name] on 06/26/2015 after findings a large abdominopelvic mass by Gynecologist, Dr. Evie Lacks.  It is dictated that the patient had a pre-operative elevation of CA 125 and CEA.  Labs today: CBC diff, CMET, CA 125, CEA  According to pathologic staging, she has a Stage IA malignancy and NCCN guidelines recommends observation only.  HOWEVER, cause was discussed amongst tumor board and during surgery, Dr. Skeet Latch was concerned that she had a T1c tumor and the consensus of that conference was the err on the side of caution and recommend systemic chemotherapy.  We were not privy to this information at the time of her consultation today.  Therefore, I will call the patient with this information and see if she wishes to return tomorrow to review her treatment options.  We will refer her to Dr. Britta Mccreedy (her request) for GI consultation and work-up.  She needs screening colonoscopy as she is overdue, in addition to her newly diagnosed mucinous adenocarcinoma.    I collected her family history and I will refer her to genetic counseling at [facility].  We will see her back sooner than previously scheduled.

## 2015-07-11 ENCOUNTER — Encounter (HOSPITAL_BASED_OUTPATIENT_CLINIC_OR_DEPARTMENT_OTHER): Payer: BLUE CROSS/BLUE SHIELD | Admitting: Hematology & Oncology

## 2015-07-11 ENCOUNTER — Encounter (HOSPITAL_COMMUNITY): Payer: Self-pay | Admitting: Lab

## 2015-07-11 DIAGNOSIS — C801 Malignant (primary) neoplasm, unspecified: Secondary | ICD-10-CM

## 2015-07-11 LAB — CA 125: CA 125: 304.9 U/mL — AB (ref 0.0–38.1)

## 2015-07-11 LAB — CEA: CEA: 1.4 ng/mL (ref 0.0–4.7)

## 2015-07-11 NOTE — Patient Instructions (Signed)
[place] at [facility] Discharge Instructions  RECOMMENDATIONS MADE BY THE CONSULTANT AND ANY TEST RESULTS WILL BE SENT TO YOUR REFERRING PHYSICIAN.  Due to the results of the assessment during the surgery and the elevated CA 125 level, we have changed the grading of your cancer to 1C from 1A.   This means that we will need to treat you with chemotherapy.  We will be in touch with the rest of your medical team to make a decision on the regimen that we decide to move forward with.   We will need to have a port placed for your treatment.  We will refer you to Dr. Arnoldo Morale for this.    We will have our financial counselor talk to you about the financial part of the treatment once we decide which regimen we will be using.   Hildred Alamin will be in contact with you after we decide which treatment to use to teach on each medication that we are going to give you for treatment.  She will also call you for your return appointments.    Thank you for choosing [place] at [facility] to provide your oncology and hematology care.  To afford each patient quality time with our provider, please arrive at least 15 minutes before your scheduled appointment time.   Beginning January 23rd 2017 lab work for the [facility] will be done in the  Main lab at [facility] on 1st floor. If you have a lab appointment with the [facility] please come in thru the  Main Entrance and check in at the main information desk  You need to re-schedule your appointment should you arrive 10 or more minutes late.  We strive to give you quality time with our providers, and arriving late affects you and other patients whose appointments are after yours.  Also, if you no show three or more times for appointments you may be dismissed from the clinic at the providers discretion.     Again, thank you for choosing [facility]  Our hope is that these requests will decrease the amount of  time that you wait before being seen by our physicians.       _____________________________________________________________  Should you have questions after your visit to [facility], please contact our office at [phone] between the hours of 8:30 a.m. and 4:30 p.m.  Voicemails left after 4:30 p.m. will not be returned until the following business day.  For prescription refill requests, have your pharmacy contact our office.

## 2015-07-11 NOTE — Progress Notes (Signed)
Referral sent to Dr Arnoldo Morale  1/31 appt. Records faxed on 1/27

## 2015-07-11 NOTE — Progress Notes (Signed)
[facility], [place] [address] 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 Prominent right gonadal vasculature. Tiny hypodensity in the dome of the liver is too small to characterize, but stable.   06/26/2015 Pathology Results Adnexa - ovary +/- tube, neoplastic, right - MUCINOUS ADENOCARCINOMA, 13 CM. - OVARIAN SURFACE NOT INVOLVED BY TUMOR. - UNREMARKABLE FALLOPIAN TUBE WITH NO ENDOMETRIOSIS OR MALIGNANCY.   06/26/2015 Procedure Exploratory laparotomy with total hysterectomy bilateral salpingo-oophorectomy, pelvic and para-aortic lymph node dissection, omentectomy washings and biopsies for ovarian cancer by Dr. [name]   06/27/2015 Pathology Results PERITONEAL WASHING(SPECIMEN 1 OF 1 COLLECTED 06/27/15): ATYPICAL CELLS. Immunohistochemistry calretinin, cytokeratin 5/6, estrogen receptor, progesterone receptor and WT-1 is performed. The morphology and immunophenotype favor reactive mesothelial cells   07/10/2015 Initial Diagnosis Mucinous adenocarcinoma ([name])   07/22/2015 -  Chemotherapy FOLFOX   07/24/2015 Survivorship Genetic Counseling consultation with [name].   07/26/2015 Adverse Reaction Loose stools, 2-3 per 24 hours   07/29/2015 Treatment Plan Change 5FU bolus discontinued due to loose stools.    CURRENT THERAPY:  INTERVAL HISTORY: Natalie Ball 65 y.o. female returns for follow-up of mucinous adenocarcinoma.  She returns today to discuss recommendations for chemotherapy.  At our visit yesterday it was originally thought her tumor was a T1a, however she has been staged as T1c with  recommendations to proceed with chemotherapy.  In addition, tumor markers obtained yesterday show persistent elevation of CA-125.   She notes that she is "ok" with proceeding with chemotherapy if that is what she needs.   MEDICAL HISTORY: Past Medical History  Diagnosis Date  . Allergy   . GERD (gastroesophageal reflux disease)   . Complication of anesthesia   . PONV (postoperative nausea and vomiting)   . Wears glasses   . Tinnitus   . Vertigo   . Pneumonia   . History of bronchitis   . Urinary urgency   . Headache   . Mucinous adenocarcinoma ([name]) 07/10/2015  . Family history of ovarian cancer   . Family history of pancreatic cancer   . Family history of colon cancer     has Mucinous adenocarcinoma ([place]); Family history of ovarian cancer; Family history of pancreatic cancer; and Family history of colon cancer on her problem list.     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  _0 @  SURGICAL HISTORY: Past Surgical History  Procedure Laterality Date  . Tubal ligation  1974  . Diagnostic laparoscopy  1977  . Rectal fusula  1980  . Appendectomy    . Laparotomy N/A 06/26/2015    Procedure: EXPLORATORY LAPAROTOMY;  Surgeon: [name], MD;  Location: [facility];  Service: Gynecology;  Laterality: N/A;  . Abdominal hysterectomy N/A 06/26/2015    Procedure: TOTAL HYSTERECTOMY ABDOMINAL;  Surgeon: [name], MD;  Location: [facility];  Service: Gynecology;  Laterality: N/A;  . Salpingoophorectomy Bilateral 06/26/2015    Procedure: SALPINGO OOPHORECTOMY;  Surgeon: [name], MD;  Location: [facility];  Service: Gynecology;  Laterality: Bilateral;  . Omentectomy N/A 06/26/2015    Procedure:  OMENTECTOMY;  Surgeon: [name], MD;  Location: [facility];  Service: Gynecology;  Laterality: N/A;  . Debulking  N/A 06/26/2015    Procedure: bilateral periaortic lymph node dissection biopsies and peritoneal washings;  Surgeon: [name], MD;  Location: [facility];  Service: Gynecology;  Laterality:  N/A;  . Portacath placement Left 07/18/2015    Procedure: INSERTION PORT-A-CATH;  Surgeon: Aviva Signs, MD;  Location: [facility];  Service: General;  Laterality: Left;  pt knows to arrive at 6:15    SOCIAL HISTORY: Social History   Social History  . Marital Status: Widowed    Spouse Name: N/A  . Number of Children: 1  . Years of Education: N/A   Occupational History  . Not on file.   Social History Main Topics  . Smoking status: Former Smoker -- 1.00 packs/day for 19 years    Types: Cigarettes    Quit date: 06/14/1988  . Smokeless tobacco: Never Used  . Alcohol Use: Yes     Comment: occasionally   . Drug Use: No  . Sexual Activity: Not on file   Other Topics Concern  . Not on file   Social History Narrative  reports that she quit smoking about 27 years ago. Her smoking use included Cigarettes. She has a 19 pack-year smoking history. She has never used smokeless tobacco. She reports that she drinks alcohol. She reports that she does not use illicit drugs. She reports that prior to her illness, she enjoyed an intermittent beer or marguirtia. She denies being a heavy drinker of EtOH. She notes that she is religious and she associates herselft with [place]. 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 She is widowed  FAMILY HISTORY: Family History  Problem Relation Age of Onset  . Cancer Father   . Colon polyps Father   . Ovarian cancer Maternal Aunt   . Colon cancer Maternal Aunt   . Pancreatic cancer Maternal Uncle   . Colon polyps Mother   . Colon polyps Sister   . Cancer Paternal Aunt     NOS  . Heart defect Maternal Grandmother     hole in the heart  . COPD Maternal Grandfather   . Colon cancer Paternal Grandmother     dx possibly under 42  . Mental illness Sister   . Uterine cancer Maternal Aunt     dx possibly in her 13s  . Pancreatic cancer Maternal Aunt   . Cancer Paternal Aunt     NOS  . Cancer Cousin     adrenal cancer dx in his  56s; maternal cousin  . Ovarian cancer Cousin     dx in her 55s; maternal cousin  . Diabetes Daughter   Mother is alive at the age of 68 with heart disease, hypothyroidism, and valgus malformation of her ankles bilaterally inhibiting her ability to ambulate well. Father is 2 years old and alive with HTN and a mild form of dementia.  Sister is 78 yo with GERD Sister is 38 yo who was estranged from the patient until most recently. Sister 70 yo who has some sort of mental disorder requiring medications. She has 1 daughter who is 31 years old with DM and hypothyroidism. She has 1 granddaughter who is 51 yo who is overweight, but otherwise healthy. She is in college in [place], [place].  Review of Systems  Constitutional: Negative for fever, chills, weight loss and malaise/fatigue.  HENT: Negative for congestion, hearing loss, nosebleeds, sore throat and tinnitus.   Eyes: Negative for blurred vision, double vision, pain and discharge.  Respiratory: Negative for cough, hemoptysis, sputum  production, shortness of breath and wheezing.   Cardiovascular: Negative for chest pain, palpitations, claudication, leg swelling and PND.  Gastrointestinal: Negative for heartburn, nausea, vomiting, abdominal pain, diarrhea, constipation, blood in stool and melena.  Genitourinary: Negative for dysuria, urgency, frequency and hematuria.  Musculoskeletal: Negative for myalgias, joint pain and falls.  Skin: Negative for itching and rash.  Neurological: Negative for dizziness, tingling, tremors, sensory change, speech change, focal weakness, seizures, loss of consciousness, weakness and headaches.  Endo/Heme/Allergies: Does not bruise/bleed easily.  Psychiatric/Behavioral: Negative for depression, suicidal ideas, memory loss and substance abuse. The patient is not nervous/anxious and does not have insomnia.     PHYSICAL EXAMINATION  ECOG PERFORMANCE STATUS: 0 - Asymptomatic  Vitals - 1 value per visit 2/37/6283    SYSTOLIC 151  DIASTOLIC 77  Pulse 66  Temperature 98.2  Respirations 16  Weight (lb) 127.2  Height _0   BMI 21.17   There were no vitals filed for this visit.  Physical Exam  Constitutional: She is oriented to person, place, and time and well-developed, well-nourished, and in no distress.  HENT:  Head: Normocephalic and atraumatic.  Nose: Nose normal.  Mouth/Throat: Oropharynx is clear and moist. No oropharyngeal exudate.  Eyes: Conjunctivae and EOM are normal. Pupils are equal, round, and reactive to light. Right eye exhibits no discharge. Left eye exhibits no discharge. No scleral icterus.  Neck: Normal range of motion. Neck supple. No tracheal deviation present. No thyromegaly present.  Cardiovascular: Normal rate, regular rhythm and normal heart sounds.  Exam reveals no gallop and no friction rub.   No murmur heard. Pulmonary/Chest: Effort normal and breath sounds normal. She has no wheezes. She has no rales.  Abdominal: Soft. Bowel sounds are normal. She exhibits no distension and no mass. There is no tenderness. There is no rebound and no guarding.  Well healed surgical incision site  Musculoskeletal: Normal range of motion. She exhibits no edema.  Lymphadenopathy:    She has no cervical adenopathy.  Neurological: She is alert and oriented to person, place, and time. She has normal reflexes. No cranial nerve deficit. Gait normal. Coordination normal.  Skin: Skin is warm and dry. No rash noted.  Psychiatric: Mood, memory, affect and judgment normal.  Nursing note and vitals reviewed.   LABORATORY DATA: I have reviewed the data as listed.  Results for FRANZISKA, PODGURSKI (MRN [id number])   Ref. Range 07/10/2015 16:05  CA 125 Latest Ref Range: 0.0-38.1 U/mL 304.9 (H)   Results for JERRYE, SEEBECK (MRN [id number])   Ref. Range 07/10/2015 16:05  Sodium Latest Ref Range: 135-145 mmol/L 141  Potassium Latest Ref Range: 3.5-5.1 mmol/L 4.6  Chloride Latest Ref Range: 101-111  mmol/L 101  CO2 Latest Ref Range: 22-32 mmol/L 26  BUN Latest Ref Range: 6-20 mg/dL 17  Creatinine Latest Ref Range: 0.44-1.00 mg/dL 0.74  Calcium Latest Ref Range: 8.9-10.3 mg/dL 10.3  EGFR (Non-African Amer.) Latest Ref Range: >60 mL/min >60  EGFR (African American) Latest Ref Range: >60 mL/min >60  Glucose Latest Ref Range: 65-99 mg/dL 127 (H)  Anion gap Latest Ref Range: 5-15  14  Alkaline Phosphatase Latest Ref Range: 38-126 U/L 60  Albumin Latest Ref Range: 3.5-5.0 g/dL 4.5  AST Latest Ref Range: 15-41 U/L 33  ALT Latest Ref Range: 14-54 U/L 35  Total Protein Latest Ref Range: 6.5-8.1 g/dL 7.8  Total Bilirubin Latest Ref Range: 0.3-1.2 mg/dL 0.4   Results for VERDELLA, LAIDLAW (MRN [id number])   Ref. Range 07/10/2015 16:05  CEA Latest Ref Range: 0.0-4.7 ng/mL 1.4   Results for CHANDI, NICKLIN (MRN [id number])   Ref. Range 07/10/2015 16:05  WBC Latest Ref Range: 4.0-10.5 K/uL 10.0  RBC Latest Ref Range: 3.87-5.11 MIL/uL 4.32  Hemoglobin Latest Ref Range: 12.0-15.0 g/dL 13.8  HCT Latest Ref Range: 36.0-46.0 % 41.9  MCV Latest Ref Range: 78.0-100.0 fL 97.0  MCH Latest Ref Range: 26.0-34.0 pg 31.9  MCHC Latest Ref Range: 30.0-36.0 g/dL 32.9  RDW Latest Ref Range: 11.5-15.5 % 12.9  Platelets Latest Ref Range: 150-400 K/uL 479 (H)  Neutrophils Latest Units: % 72  Lymphocytes Latest Units: % 18  Monocytes Relative Latest Units: % 5  Eosinophil Latest Units: % 4  Basophil Latest Units: % 1  NEUT# Latest Ref Range: 1.7-7.7 K/uL 7.3  Lymphocyte # Latest Ref Range: 0.7-4.0 K/uL 1.8  Monocyte # Latest Ref Range: 0.1-1.0 K/uL 0.5  Eosinophils Absolute Latest Ref Range: 0.0-0.7 K/uL 0.4  Basophils Absolute Latest Ref Range: 0.0-0.1 K/uL 0.1     RADIOGRAPHIC STUDIES: I have personally reviewed the radiological images as listed and agreed with the findings in the report.  Study Result     CLINICAL DATA: Cystic ovarian mass.  EXAM: CT CHEST, ABDOMEN, AND PELVIS WITH  CONTRAST  TECHNIQUE: Multidetector CT imaging of the chest, abdomen and pelvis was performed following the standard protocol during bolus administration of intravenous contrast.  CONTRAST: 162m OMNIPAQUE IOHEXOL 300 MG/ML SOLN  COMPARISON: Pelvic MRI from 06/12/2015. CT chest from 04/07/2015.  FINDINGS: CT CHEST FINDINGS  Mediastinum/Lymph Nodes: There is no axillary lymphadenopathy. No mediastinal lymphadenopathy. There is no hilar lymphadenopathy. The heart size is normal. No pericardial effusion. The esophagus has normal imaging features. Small hiatal hernia noted.  Lungs/Pleura: No focal airspace consolidation. No pulmonary edema or pleural effusion. 4 mm left lower lobe pulmonary nodule (image 37 series 4) is stable since 04/07/2015. 3 mm right middle lobe nodule on the same image is also stable.  Musculoskeletal: Bone windows reveal no worrisome lytic or sclerotic osseous lesions.  CT ABDOMEN PELVIS FINDINGS  Hepatobiliary: 5 mm hypodensity in the dome of the liver is stable since 04/07/2015. Geographic low attenuation of the liver parenchyma is compatible with fatty deposition. There is no evidence for gallstones, gallbladder wall thickening, or pericholecystic fluid. No intrahepatic or extrahepatic biliary dilation.  Pancreas: No focal mass lesion. No dilatation of the main duct. No intraparenchymal cyst. No peripancreatic edema.  Spleen: No splenomegaly. No focal mass lesion.  Adrenals/Urinary Tract: No adrenal nodule or mass. 7 mm low-density lesion extreme upper pole right kidney too small to characterize but likely a tiny cyst. Left kidney unremarkable. No evidence for hydroureter. Bladder is decompressed.  Stomach/Bowel: Small hiatal hernia. Stomach otherwise unremarkable. Duodenum is normally positioned as is the ligament of Treitz. No small bowel wall thickening. No small bowel dilatation. The terminal ileum is normal. The appendix is  not visualized, but there is no edema or inflammation in the region of the cecum. Diverticuli are seen scattered along the entire length of the colon without CT findings of diverticulitis.  Vascular/Lymphatic: No abdominal aortic aneurysm. No abdominal atherosclerotic calcification. There is no gastrohepatic or hepatoduodenal ligament lymphadenopathy. No intraperitoneal or retroperitoneal lymphadenopy. No pelvic sidewall lymphadenopathy.  Reproductive: Uterus is unremarkable aside from displacement by the large pelvic mass. 23.4 x 11.6 x 25.8 cm multiloculated complex cystic lesion is identified in the anterior aspect of the central pelvis. This mass appears to be distinct from both the uterus and left ovary in  the right gonadal vasculature tracks into the region of this mass. Imaging features are highly suspicious for cystic ovarian neoplasm.  Other: Trace intraperitoneal free fluid is seen in the cul-de-sac and adjacent to the liver tip.  Musculoskeletal: Bone windows reveal no worrisome lytic or sclerotic osseous lesions.  IMPRESSION: 1. Complex cystic central pelvic mass as seen previously. 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 2. Prominent right gonadal vasculature. 3. Small hiatal hernia. 4. Tiny hypodensity in the dome of the liver is too small to characterize, but is unchanged since 04/07/2015. 5. Trace intraperitoneal free fluid including adjacent to the right liver and in the central pelvis.   Electronically Signed  By: Misty Stanley M.D.  On: 06/24/2015 17:17    No results found.   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 Persistent post operative elevation in CA-125 at 304.9 U/ml, normal post-op CEA  We discussed the need for chemotherapy. Typical regimens are FOLFOX  or Carboplatin/Taxol. I advised the patient that she should keep her follow-up with Dr. Denman George on Monday. We will plan on institution of chemotherapy in the next week or so.   She will need a colonoscopy and has been referred to Dr. Britta Mccreedy in [place] per her request.   She will need port placement and formal chemotherapy teaching, We will arrange for port placement. In addition once appropriate regimen is chosen we will arrange for chemotherapy teaching here with [facility]. CA-125 will be monitored throughout therapy.   All questions were answered. The patient knows to call the clinic with any problems, questions or concerns. We can certainly see the patient much sooner if necessary. . This document serves as a record of services personally performed by Ancil Linsey, MD. It was created on her behalf by Arlyce Harman, a trained medical scribe. The creation of this record is based on the scribe's personal observations and the provider's statements to them. This document has been checked and approved by the attending provider.  I have reviewed the above documentation for accuracy and completeness, and I agree with the above.  This note was electronically signed. Molli Hazard, MD 08/11/2015

## 2015-07-14 ENCOUNTER — Encounter: Payer: Self-pay | Admitting: Gynecologic Oncology

## 2015-07-14 ENCOUNTER — Ambulatory Visit: Payer: BLUE CROSS/BLUE SHIELD | Attending: Gynecologic Oncology | Admitting: Gynecologic Oncology

## 2015-07-14 VITALS — BP 122/86 | HR 69 | Temp 98.2°F | Resp 18 | Ht 65.0 in | Wt 128.9 lb

## 2015-07-14 DIAGNOSIS — H9319 Tinnitus, unspecified ear: Secondary | ICD-10-CM | POA: Diagnosis not present

## 2015-07-14 DIAGNOSIS — Z87891 Personal history of nicotine dependence: Secondary | ICD-10-CM | POA: Insufficient documentation

## 2015-07-14 DIAGNOSIS — R42 Dizziness and giddiness: Secondary | ICD-10-CM | POA: Insufficient documentation

## 2015-07-14 DIAGNOSIS — F1010 Alcohol abuse, uncomplicated: Secondary | ICD-10-CM | POA: Diagnosis not present

## 2015-07-14 DIAGNOSIS — K219 Gastro-esophageal reflux disease without esophagitis: Secondary | ICD-10-CM | POA: Insufficient documentation

## 2015-07-14 DIAGNOSIS — C561 Malignant neoplasm of right ovary: Secondary | ICD-10-CM | POA: Insufficient documentation

## 2015-07-14 DIAGNOSIS — C801 Malignant (primary) neoplasm, unspecified: Secondary | ICD-10-CM

## 2015-07-14 DIAGNOSIS — Z800 Family history of malignant neoplasm of digestive organs: Secondary | ICD-10-CM | POA: Insufficient documentation

## 2015-07-14 NOTE — Progress Notes (Signed)
Follow-up Note: Gyn-Onc  Consult was initally requested by Dr. Evie Ball for the evaluation of Natalie Ball 65 y.o. female  CC:  Chief Complaint  Patient presents with  . Ovarian Cancer    MD follow up visit   CC: Dr Natalie Ball, Dr Natalie Ball  Assessment/Plan:  [name]  is a 65 y.o.  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   She had intraoperative rupture of the cyst, however all biopsies of extraovarian tissue were negative.  Due to the stage IC nature of her disease, she is recommended to receive adjuvant chemotherapy with FOLFOX (given mucinous cell type). An alternative therapy choice would be carboplatin and paclitaxel, though this cell type is traditionally less sensitive to this regimen.  I agree with the plan for colonoscopy, as if there is a primary colonic lesion, this would guide therapy.  [name] is seeing Dr Natalie Ball for chemotherapy. She is adequately healed from a postop standpoint to start chemotherapy.  Dr Natalie Ball or I will see her back for follow-up after completing her planned course of adjuvant therapy.   HPI: Natalie Ball is a very pleasant 65 year old woman who is seen in consultation at the request of Dr. Evie Ball for a complex cystic pelvic and abdominal mass. The patient reports a history of abdominal bloating since December 2016. She also reports the development of hair on her face and lower abdomen in the past 4 months. She saw her gynecologist on 06/04/2015 and reported this feeling of abdominal bloating and distention and some scoliosis ultrasound scan was performed on 06/05/2015. This showed a large multicystic and septated mass from the right hemipelvis with vascular septations measuring 21 x 14 x 18 cm. There was also traced simple appearing pelvic free fluid. She then underwent an MRI of the pelvis on 06/05/2015  which revealed a grossly normal uterus measuring 8 cm with subserosal fibroids. There was a large complex cystic mass in the anterior pelvis extending into the mid abdomen measuring 24 x 10 x 25 cm. This mass contained numerous internal septations as well as several enhancing soft tissue nodules measuring 2-3 cm in size. 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 Tumor markers were then drawn on 06/05/2015. The CA-125 was elevated at 291.9. The CEA was elevated at 7.1.  The patient has a strong family history for colon cancer on her father's side of the family. Although she is unclear about which relatives specifically had colon cancer and their ages. Her last colonoscopy was a proximally 14 years ago. The patient reports having some altered GI function with intermittent constipation but denies hematochezia or melena or narrowed caliber of stools.   Interval Hx: On 06/26/15 she underwent a total dominant hysterectomy, BSO, omentectomy, bilateral pelvic and para-aortic lymph node dissection, and peritoneal biopsies with Dr. Janie Ball. Intraoperative findings included a 28 cm mass nonadherent to any surrounding structures. There was a small amount of ascites present that was benign on cytology. The left tube and ovary were grossly normal. There was no evident disease on the small and large bowel and the appendix was surgically absent. The ovarian cyst was unavoidably ruptured intraoperatively. There was no gross residual disease at the completion of the surgery.  Final pathology revealed a mucinous adenocarcinoma measuring 13 cm. The ovarian surface was not  involved by tumor. Washings had been negative though there had been unavoidable cyst rupture intraoperatively. All other biopsies including lymph node dissection and omentectomy were negative for metastatic disease.  The patient has done very well postoperatively with no concerns or  complications.  Current Meds:  Outpatient Encounter Prescriptions as of 07/14/2015  Medication Sig  . bisacodyl (DULCOLAX) 10 MG suppository Place 10 mg rectally as needed for moderate constipation.  . docusate sodium (COLACE) 100 MG capsule Take 200 mg by mouth 2 (two) times daily.  Marland Kitchen enoxaparin (LOVENOX) 40 MG/0.4ML injection Inject 0.4 mLs (40 mg total) into the skin daily.  . famotidine (PEPCID) 20 MG tablet Take 20 mg by mouth every Ball.  . fluticasone (FLONASE) 50 MCG/ACT nasal spray Place 1 spray into both nostrils daily.  Marland Kitchen loratadine (CLARITIN) 10 MG tablet Take 10 mg by mouth daily as needed for allergies.  . Papaya Enzyme CHEW Chew 1 tablet by mouth daily. For digestive health  . ibuprofen (ADVIL,MOTRIN) 200 MG tablet Take 400 mg by mouth every 6 (six) hours as needed for headache or moderate pain. Reported on 07/14/2015  . oxyCODONE-acetaminophen (PERCOCET/ROXICET) 5-325 MG tablet Take 1-2 tablets by mouth every 6 (six) hours as needed for severe pain. (Patient not taking: Reported on 07/10/2015)   No facility-administered encounter medications on file as of 07/14/2015.    Allergy:  Allergies  Allergen Reactions  . Sulfur Other (See Comments)    May have caused blood in urine - unknown    Social Hx:   Social History   Social History  . Marital Status: Widowed    Spouse Name: N/A  . Number of Children: N/A  . Years of Education: N/A   Occupational History  . Not on file.   Social History Main Topics  . Smoking status: Former Smoker -- 1.00 packs/day for 19 years    Types: Cigarettes    Quit date: 06/14/1988  . Smokeless tobacco: Never Used  . Alcohol Use: Yes     Comment: occasionally   . Drug Use: No  . Sexual Activity: Not on file   Other Topics Concern  . Not on file   Social History Narrative    Past Surgical Hx:  Past Surgical History  Procedure Laterality Date  . Tubal ligation  1974  . Diagnostic laparoscopy  1977  . Rectal fusula  1980  .  Appendectomy    . Laparotomy N/A 06/26/2015    Procedure: EXPLORATORY LAPAROTOMY;  Surgeon: Natalie Morning, MD;  Location: [facility];  Service: Gynecology;  Laterality: N/A;  . Abdominal hysterectomy N/A 06/26/2015    Procedure: TOTAL HYSTERECTOMY ABDOMINAL;  Surgeon: Natalie Morning, MD;  Location: [facility];  Service: Gynecology;  Laterality: N/A;  . Salpingoophorectomy Bilateral 06/26/2015    Procedure: SALPINGO OOPHORECTOMY;  Surgeon: Natalie Morning, MD;  Location: [facility];  Service: Gynecology;  Laterality: Bilateral;  . Omentectomy N/A 06/26/2015    Procedure:  OMENTECTOMY;  Surgeon: Natalie Morning, MD;  Location: [facility];  Service: Gynecology;  Laterality: N/A;  . Debulking N/A 06/26/2015    Procedure: bilateral periaortic lymph node dissection biopsies and peritoneal washings;  Surgeon: Natalie Morning, MD;  Location: [facility];  Service: Gynecology;  Laterality: N/A;    Past Medical Hx:  Past Medical History  Diagnosis Date  . Allergy   . GERD (gastroesophageal reflux disease)   . Complication of anesthesia   . PONV (postoperative nausea and vomiting)   . Wears glasses   . Tinnitus   .  Vertigo   . Pneumonia   . History of bronchitis   . Urinary urgency   . Headache   . Mucinous adenocarcinoma ([place]) 07/10/2015    Past Gynecological History:   No LMP recorded. Patient is postmenopausal.  Family Hx:  Family History  Problem Relation Age of Onset  . Cancer Father   . Cancer Maternal Aunt   . Cancer Maternal Uncle     Review of Systems:  Constitutional  Feels bloated  ENT Normal appearing ears and nares bilaterally Skin/Breast  No rash, sores, jaundice, itching, dryness Cardiovascular  No chest pain, shortness of breath, or edema  Pulmonary  No cough or wheeze.  Gastro Intestinal  No nausea, vomitting, or diarrhoea. No bright red blood per rectum, no abdominal pain, change in bowel movement, or constipation.  Genito Urinary  No frequency, urgency, dysuria, no bleeding Musculo  Skeletal  No myalgia, arthralgia, joint swelling or pain  Neurologic  No weakness, numbness, change in gait,  Psychology  No depression, anxiety, insomnia.   Vitals:  Blood pressure 122/86, pulse 69, temperature 98.2 F (36.8 C), temperature source Oral, resp. rate 18, height 5\' 5"  (1.651 m), weight 128 lb 14.4 oz (58.469 kg), SpO2 100 %.  Physical Exam: WD in NAD Neck  Supple NROM, without any enlargements.  Lymph Node Survey No cervical supraclavicular or inguinal adenopathy Cardiovascular  Pulse normal rate, regularity and rhythm. S1 and S2 normal.  Lungs  Clear to auscultation bilateraly, without wheezes/crackles/rhonchi. Good air movement.  Skin  No rash/lesions/breakdown  Psychiatry  Alert and oriented to person, place, and time  Abdomen  Normoactive bowel sounds, abdomen soft, non-tender and thin without evidence of hernia. Well healed vertical midline incision.  Back No CVA tenderness Genito Urinary  Vulva/vagina: Normal external female genitalia.  No lesions. No discharge or bleeding.  Bladder/urethra:  No lesions or masses, well supported bladder  Vagina: grossly normal. Cuff in tact and healing normally.  Cervix: surgically absent  Uterus:  Surgically absent  Adnexa: no palpable masses.  Rectal  Good tone, no masses no cul de sac nodularity.  Extremities  No bilateral cyanosis, clubbing or edema.   30 minutes of direct face to face counseling time was spent with the patient. This included discussion about prognosis, therapy recommendations and postoperative side effects and are beyond the scope of routine postoperative care.  Donaciano Eva, MD  07/14/2015, 4:54 PM

## 2015-07-14 NOTE — Patient Instructions (Signed)
Plan to proceed with a colonoscopy.  Plan to follow up with Dr. Denman George after completion of chemotherapy.  Please call for any questions or concerns.

## 2015-07-16 ENCOUNTER — Telehealth (HOSPITAL_COMMUNITY): Payer: Self-pay | Admitting: Hematology & Oncology

## 2015-07-16 ENCOUNTER — Encounter (HOSPITAL_COMMUNITY)
Admission: RE | Admit: 2015-07-16 | Discharge: 2015-07-16 | Disposition: A | Discharge status: Home / Self Care | Payer: BLUE CROSS/BLUE SHIELD | Source: Ambulatory Visit | Attending: General Surgery | Admitting: General Surgery

## 2015-07-16 ENCOUNTER — Encounter (HOSPITAL_COMMUNITY): Payer: Self-pay

## 2015-07-16 ENCOUNTER — Other Ambulatory Visit (HOSPITAL_COMMUNITY): Payer: Self-pay | Admitting: *Deleted

## 2015-07-16 DIAGNOSIS — C801 Malignant (primary) neoplasm, unspecified: Secondary | ICD-10-CM

## 2015-07-16 MED ORDER — PROCHLORPERAZINE MALEATE 10 MG PO TABS: 10.0000 mg | ORAL_TABLET | Freq: Four times a day (QID) | ORAL | Status: DC | PRN

## 2015-07-16 MED ORDER — ONDANSETRON HCL 8 MG PO TABS: 8.0000 mg | ORAL_TABLET | Freq: Three times a day (TID) | ORAL | Status: DC | PRN

## 2015-07-16 MED ORDER — LIDOCAINE-PRILOCAINE 2.5-2.5 % EX CREA: TOPICAL_CREAM | CUTANEOUS | Status: DC

## 2015-07-16 NOTE — Telephone Encounter (Signed)
PC TO [employer] [place] W. ?'D IF [id number] [place]. MARKET[facility] WILL FAX OVER A PRE AUTH FORM. CALL REF# [facility] [phone]

## 2015-07-16 NOTE — H&P (Signed)
  NTS SOAP Note  Vital Signs:  Vitals as of: 123XX123: Systolic 123456: Diastolic 75: Heart Rate 69: Temp 97.24F (Temporal): Height 80ft 5in: Weight 130Lbs 0 Ounces: BMI 21.63   BMI : 21.63 kg/m2  Subjective: This 65 year old female presents for of portacath insertion.  Is about to undergo chemotherapy for ovarian cancer.  Review of Symptoms:  Constitutional:unremarkable   Head:unremarkable Eyes:unremarkable   sinus problems Cardiovascular:  unremarkable Respiratory:unremarkable Gastrointestinheartburn, dyspepsia Genitourinary:unremarkable   neck pain Skin:unremarkable Hematolgic/Lymphatic:unremarkable   Allergic/Immunologic:unremarkable   Past Medical History:  Reviewed  Past Medical History  Surgical History: TAH-BSO Medical Problems: ovarian carcinoma, reflux Allergies: sulfa Medications: lovenox injections, fluticasone, famotidine   Social History:Reviewed  Social History  Preferred Language: English Race:  White Ethnicity: Not Hispanic / Latino Age: 65 year Marital Status:  W Alcohol: no   Smoking Status: Never smoker reviewed on 07/15/2015 Functional Status reviewed on 07/15/2015 ------------------------------------------------ Bathing: Normal Cooking: Normal Dressing: Normal Driving: Normal Eating: Normal Managing Meds: Normal Oral Care: Normal Shopping: Normal Toileting: Normal Transferring: Normal Walking: Normal Cognitive Status reviewed on 07/15/2015 ------------------------------------------------ Attention: Normal Decision Making: Normal Language: Normal Memory: Normal Motor: Normal Perception: Normal Problem Solving: Normal Visual and Spatial: Normal   Family History:Reviewed  Family Health History Mother, Living; Heart disease;  Father, Living; Dementia; Hypertension (high blood pressure);     Objective Information: General:Well appearing, well nourished in no distress. Heart:RRR, no murmur Lungs:   CTA bilaterally, no wheezes, rhonchi, rales.  Breathing unlabored.  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   Patient Education:Alternative treatments to surgery were discussed with patient (and family).  Risks and benefits  of procedure incluidng bleeding, infection, and pneumothorax were fully explained to the patient (and family) who gave informed consent. Patient/family questions were addressed.  Follow-up:Pending Surgery

## 2015-07-18 ENCOUNTER — Ambulatory Visit (HOSPITAL_COMMUNITY): Payer: BLUE CROSS/BLUE SHIELD

## 2015-07-18 ENCOUNTER — Encounter (HOSPITAL_COMMUNITY): Payer: Self-pay | Admitting: *Deleted

## 2015-07-18 ENCOUNTER — Ambulatory Visit (HOSPITAL_COMMUNITY)
Admission: RE | Admit: 2015-07-18 | Discharge: 2015-07-18 | Disposition: A | Discharge status: Home / Self Care | Payer: BLUE CROSS/BLUE SHIELD | Source: Ambulatory Visit | Attending: General Surgery | Admitting: General Surgery

## 2015-07-18 ENCOUNTER — Encounter (HOSPITAL_COMMUNITY)
Admission: RE | Disposition: A | Discharge status: Home / Self Care | Payer: Self-pay | Source: Ambulatory Visit | Attending: General Surgery

## 2015-07-18 ENCOUNTER — Ambulatory Visit (HOSPITAL_COMMUNITY): Payer: BLUE CROSS/BLUE SHIELD | Admitting: Anesthesiology

## 2015-07-18 DIAGNOSIS — Z7951 Long term (current) use of inhaled steroids: Secondary | ICD-10-CM | POA: Insufficient documentation

## 2015-07-18 DIAGNOSIS — Z90710 Acquired absence of both cervix and uterus: Secondary | ICD-10-CM | POA: Diagnosis not present

## 2015-07-18 DIAGNOSIS — Z79899 Other long term (current) drug therapy: Secondary | ICD-10-CM | POA: Diagnosis not present

## 2015-07-18 DIAGNOSIS — Z7901 Long term (current) use of anticoagulants: Secondary | ICD-10-CM | POA: Diagnosis not present

## 2015-07-18 DIAGNOSIS — Z90722 Acquired absence of ovaries, bilateral: Secondary | ICD-10-CM | POA: Diagnosis not present

## 2015-07-18 DIAGNOSIS — Z87891 Personal history of nicotine dependence: Secondary | ICD-10-CM | POA: Insufficient documentation

## 2015-07-18 DIAGNOSIS — K219 Gastro-esophageal reflux disease without esophagitis: Secondary | ICD-10-CM | POA: Insufficient documentation

## 2015-07-18 DIAGNOSIS — C569 Malignant neoplasm of unspecified ovary: Secondary | ICD-10-CM | POA: Insufficient documentation

## 2015-07-18 DIAGNOSIS — Z95828 Presence of other vascular implants and grafts: Secondary | ICD-10-CM

## 2015-07-18 HISTORY — PX: PORTACATH PLACEMENT: SHX2246

## 2015-07-18 SURGERY — INSERTION, TUNNELED CENTRAL VENOUS DEVICE, WITH PORT
Anesthesia: Monitor Anesthesia Care | Site: Chest | Laterality: Left

## 2015-07-18 MED ORDER — MIDAZOLAM HCL 2 MG/2ML IJ SOLN
INTRAMUSCULAR | Status: AC
  Filled 2015-07-18: qty 2

## 2015-07-18 MED ORDER — LIDOCAINE HCL (PF) 1 % IJ SOLN
INTRAMUSCULAR | Status: DC | PRN
  Administered 2015-07-18: 7 mL

## 2015-07-18 MED ORDER — MIDAZOLAM HCL 2 MG/2ML IJ SOLN
1.0000 mg | INTRAMUSCULAR | Status: DC | PRN
  Administered 2015-07-18: 2 mg via INTRAVENOUS

## 2015-07-18 MED ORDER — HYDROCODONE-ACETAMINOPHEN 5-325 MG PO TABS: 1.0000 | ORAL_TABLET | ORAL | Status: DC | PRN

## 2015-07-18 MED ORDER — PROPOFOL 500 MG/50ML IV EMUL
INTRAVENOUS | Status: DC | PRN
  Administered 2015-07-18: 50 ug/kg/min via INTRAVENOUS

## 2015-07-18 MED ORDER — CHLORHEXIDINE GLUCONATE 4 % EX LIQD: 1.0000 "application " | Freq: Once | CUTANEOUS | Status: DC

## 2015-07-18 MED ORDER — FENTANYL CITRATE (PF) 100 MCG/2ML IJ SOLN: 25.0000 ug | INTRAMUSCULAR | Status: DC | PRN

## 2015-07-18 MED ORDER — CEFAZOLIN SODIUM-DEXTROSE 2-3 GM-% IV SOLR
INTRAVENOUS | Status: AC
  Filled 2015-07-18: qty 50

## 2015-07-18 MED ORDER — SODIUM CHLORIDE 0.9 % IJ SOLN
INTRAMUSCULAR | Status: AC
  Filled 2015-07-18: qty 10

## 2015-07-18 MED ORDER — GLYCOPYRROLATE 0.2 MG/ML IJ SOLN
INTRAMUSCULAR | Status: AC
  Filled 2015-07-18: qty 1

## 2015-07-18 MED ORDER — CEFAZOLIN SODIUM-DEXTROSE 2-3 GM-% IV SOLR
2.0000 g | INTRAVENOUS | Status: AC
  Administered 2015-07-18: 2 g via INTRAVENOUS

## 2015-07-18 MED ORDER — ONDANSETRON HCL 4 MG/2ML IJ SOLN: 4.0000 mg | Freq: Once | INTRAMUSCULAR | Status: DC | PRN

## 2015-07-18 MED ORDER — LIDOCAINE HCL (PF) 1 % IJ SOLN
INTRAMUSCULAR | Status: AC
  Filled 2015-07-18: qty 30

## 2015-07-18 MED ORDER — HEPARIN SOD (PORK) LOCK FLUSH 100 UNIT/ML IV SOLN
INTRAVENOUS | Status: AC
  Filled 2015-07-18: qty 5

## 2015-07-18 MED ORDER — HEPARIN SOD (PORK) LOCK FLUSH 100 UNIT/ML IV SOLN
INTRAVENOUS | Status: DC | PRN
  Administered 2015-07-18: 500 [IU] via INTRAVENOUS

## 2015-07-18 MED ORDER — PROPOFOL 10 MG/ML IV BOLUS
INTRAVENOUS | Status: AC
  Filled 2015-07-18: qty 40

## 2015-07-18 MED ORDER — ROCURONIUM BROMIDE 50 MG/5ML IV SOLN
INTRAVENOUS | Status: AC
  Filled 2015-07-18: qty 1

## 2015-07-18 MED ORDER — SODIUM CHLORIDE 0.9 % IR SOLN
Status: DC | PRN
  Administered 2015-07-18: 500 mL

## 2015-07-18 MED ORDER — LACTATED RINGERS IV SOLN
INTRAVENOUS | Status: DC
  Administered 2015-07-18: 1000 mL via INTRAVENOUS

## 2015-07-18 MED ORDER — KETOROLAC TROMETHAMINE 30 MG/ML IJ SOLN
INTRAMUSCULAR | Status: AC
  Filled 2015-07-18: qty 1

## 2015-07-18 MED ORDER — MIDAZOLAM HCL 5 MG/5ML IJ SOLN
INTRAMUSCULAR | Status: DC | PRN
  Administered 2015-07-18: 2 mg via INTRAVENOUS

## 2015-07-18 MED ORDER — FENTANYL CITRATE (PF) 250 MCG/5ML IJ SOLN
INTRAMUSCULAR | Status: AC
  Filled 2015-07-18: qty 5

## 2015-07-18 MED ORDER — PROPOFOL 10 MG/ML IV BOLUS
INTRAVENOUS | Status: AC
  Filled 2015-07-18: qty 20

## 2015-07-18 MED ORDER — LIDOCAINE HCL (PF) 1 % IJ SOLN
INTRAMUSCULAR | Status: AC
  Filled 2015-07-18: qty 5

## 2015-07-18 MED ORDER — EPHEDRINE SULFATE 50 MG/ML IJ SOLN
INTRAMUSCULAR | Status: AC
  Filled 2015-07-18: qty 1

## 2015-07-18 MED ORDER — KETOROLAC TROMETHAMINE 30 MG/ML IJ SOLN
30.0000 mg | Freq: Once | INTRAMUSCULAR | Status: AC
  Administered 2015-07-18: 30 mg via INTRAVENOUS

## 2015-07-18 SURGICAL SUPPLY — 36 items
APPLIER CLIP 9.375 SM OPEN (CLIP)
BAG DECANTER FOR FLEXI CONT (MISCELLANEOUS) ×3 IMPLANT
BAG HAMPER (MISCELLANEOUS) ×3 IMPLANT
CATH HICKMAN DUAL 12.0 (CATHETERS) IMPLANT
CHLORAPREP W/TINT 10.5 ML (MISCELLANEOUS) ×3 IMPLANT
CLIP APPLIE 9.375 SM OPEN (CLIP) IMPLANT
CLOTH BEACON ORANGE TIMEOUT ST (SAFETY) ×3 IMPLANT
COVER LIGHT HANDLE STERIS (MISCELLANEOUS) ×6 IMPLANT
DECANTER SPIKE VIAL GLASS SM (MISCELLANEOUS) ×3 IMPLANT
DERMABOND ADVANCED (GAUZE/BANDAGES/DRESSINGS) ×2
DERMABOND ADVANCED .7 DNX12 (GAUZE/BANDAGES/DRESSINGS) ×1 IMPLANT
DRAPE C-ARM FOLDED MOBILE STRL (DRAPES) ×3 IMPLANT
ELECT REM PT RETURN 9FT ADLT (ELECTROSURGICAL) ×3
ELECTRODE REM PT RTRN 9FT ADLT (ELECTROSURGICAL) ×1 IMPLANT
GLOVE BIOGEL PI IND STRL 7.0 (GLOVE) ×2 IMPLANT
GLOVE BIOGEL PI INDICATOR 7.0 (GLOVE) ×4
GLOVE SURG SS PI 7.5 STRL IVOR (GLOVE) ×6 IMPLANT
GOWN STRL REUS W/TWL LRG LVL3 (GOWN DISPOSABLE) ×6 IMPLANT
IV NS 500ML (IV SOLUTION) ×2
IV NS 500ML BAXH (IV SOLUTION) ×1 IMPLANT
KIT PORT POWER 8FR ISP MRI (CATHETERS) ×3 IMPLANT
KIT PORT POWER ISP 8FR (Catheter) ×3 IMPLANT
KIT ROOM TURNOVER APOR (KITS) ×3 IMPLANT
MANIFOLD NEPTUNE II (INSTRUMENTS) ×3 IMPLANT
NEEDLE HYPO 25X1 1.5 SAFETY (NEEDLE) ×3 IMPLANT
PACK MINOR (CUSTOM PROCEDURE TRAY) ×3 IMPLANT
PAD ARMBOARD 7.5X6 YLW CONV (MISCELLANEOUS) ×3 IMPLANT
SET BASIN LINEN APH (SET/KITS/TRAYS/PACK) ×3 IMPLANT
SET INTRODUCER 12FR PACEMAKER (SHEATH) IMPLANT
SHEATH COOK PEEL AWAY SET 8F (SHEATH) IMPLANT
SUT PROLENE 3 0 PS 2 (SUTURE) IMPLANT
SUT VIC AB 3-0 SH 27 (SUTURE) ×2
SUT VIC AB 3-0 SH 27X BRD (SUTURE) ×1 IMPLANT
SUT VIC AB 4-0 PS2 27 (SUTURE) ×3 IMPLANT
SYR 20CC LL (SYRINGE) ×3 IMPLANT
SYR CONTROL 10ML LL (SYRINGE) ×3 IMPLANT

## 2015-07-18 NOTE — Op Note (Signed)
Patient:  Natalie Ball  DOB:  1951-05-07  MRN:  [id number]   Preop Diagnosis:  Ovarian carcinoma, need for central venous access  Postop Diagnosis:  Same  Procedure:  Port-A-Cath insertion  Surgeon:  Aviva Signs, M.D.  Anes:  Mac  Indications:  Patient is a 65 year old white female with ovarian carcinoma who presents for a Port-A-Cath insertion as she is about to undergo chemotherapy. The risks and benefits of the procedure including bleeding, infection, and pneumothorax were fully explained to the patient, who gave informed consent.  Procedure note:  The patient was placed in the Trendelenburg position after the left upper chest was prepped and draped using the usual sterile technique with DuraPrep. Surgical site confirmation was performed. 1% Xylocaine was used for local anesthesia.  An incision was made inferior to the left clavicle. Subcutaneous pocket was formed. A needle is advanced into the left subclavian vein using the Seldinger technique without difficulty. A guidewire was then advanced into the right atrium under fluoroscopic guidance. An introducer peel-away sheath were placed over the guidewire. The catheter was then inserted through the peel-away sheath the peel-away sheath was removed. The catheter was then attached to the port and the port placed in subcutaneous pocket. Adequate positioning was confirmed by fluoroscopy. Good backflow blood was noted in the port. The port was flushed with heparin flush. The subcutaneous layer was reapproximated using 3-0 Vicryl interrupted suture. The skin was closed using a 4 Vicryl subcuticular suture. Dermabond was applied.  All tape and needle counts were correct at the end of the procedure. The patient was transferred to PACU in stable condition. A chest x-ray will be performed at that time.  Complications:  None  EBL:  Minimal  Specimen:  None

## 2015-07-18 NOTE — Discharge Instructions (Signed)
Implanted Port Insertion, Care After °Refer to this sheet in the next few weeks. These instructions provide you with information on caring for yourself after your procedure. Your health care provider may also give you more specific instructions. Your treatment has been planned according to current medical practices, but problems sometimes occur. Call your health care provider if you have any problems or questions after your procedure. °WHAT TO EXPECT AFTER THE PROCEDURE °After your procedure, it is typical to have the following:  °· Discomfort at the port insertion site. Ice packs to the area will help. °· Bruising on the skin over the port. This will subside in 3-4 days. °HOME CARE INSTRUCTIONS °· After your port is placed, you will get a manufacturer's information card. The card has information about your port. Keep this card with you at all times.   °· Know what kind of port you have. There are many types of ports available.   °· Wear a medical alert bracelet in case of an emergency. This can help alert health care workers that you have a port.   °· The port can stay in for as long as your health care provider believes it is necessary.   °· A home health care nurse may give medicines and take care of the port.   °· You or a family member can get special training and directions for giving medicine and taking care of the port at home.   °SEEK MEDICAL CARE IF:  °· Your port does not flush or you are unable to get a blood return.   °· You have a fever or chills. °SEEK IMMEDIATE MEDICAL CARE IF: °· You have new fluid or pus coming from your incision.   °· You notice a bad smell coming from your incision site.   °· You have swelling, pain, or more redness at the incision or port site.   °· You have chest pain or shortness of breath. °  °This information is not intended to replace advice given to you by your health care provider. Make sure you discuss any questions you have with your health care provider. °  °Document  Released: 03/21/2013 Document Revised: 06/05/2013 Document Reviewed: 03/21/2013 °Elsevier Interactive Patient Education ©2016 [employer] °Implanted Port Home Guide °An implanted port is a type of central line that is placed under the skin. Central lines are used to provide IV access when treatment or nutrition needs to be given through a person's veins. Implanted ports are used for long-term IV access. An implanted port may be placed because:  °· You need IV medicine that would be irritating to the small veins in your hands or arms.   °· You need long-term IV medicines, such as antibiotics.   °· You need IV nutrition for a long period.   °· You need frequent blood draws for lab tests.   °· You need dialysis.   °Implanted ports are usually placed in the chest area, but they can also be placed in the upper arm, the abdomen, or the leg. An implanted port has two main parts:  °· Reservoir. The reservoir is round and will appear as a small, raised area under your skin. The reservoir is the part where a needle is inserted to give medicines or draw blood.   °· Catheter. The catheter is a thin, flexible tube that extends from the reservoir. The catheter is placed into a large vein. Medicine that is inserted into the reservoir goes into the catheter and then into the vein.   °HOW WILL I CARE FOR MY INCISION SITE? °Do not get the   incision site wet. Bathe or shower as directed by your health care provider.  °HOW IS MY PORT ACCESSED? °Special steps must be taken to access the port:  °· Before the port is accessed, a numbing cream can be placed on the skin. This helps numb the skin over the port site.   °· Your health care provider uses a sterile technique to access the port. °· Your health care provider must put on a mask and sterile gloves. °· The skin over your port is cleaned carefully with an antiseptic and allowed to dry. °· The port is gently pinched between sterile gloves, and a needle is inserted into the  port. °· Only "non-coring" port needles should be used to access the port. Once the port is accessed, a blood return should be checked. This helps ensure that the port is in the vein and is not clogged.   °· If your port needs to remain accessed for a constant infusion, a clear (transparent) bandage will be placed over the needle site. The bandage and needle will need to be changed every week, or as directed by your health care provider.   °· Keep the bandage covering the needle clean and dry. Do not get it wet. Follow your health care provider's instructions on how to take a shower or bath while the port is accessed.   °· If your port does not need to stay accessed, no bandage is needed over the port.   °WHAT IS FLUSHING? °Flushing helps keep the port from getting clogged. Follow your health care provider's instructions on how and when to flush the port. Ports are usually flushed with saline solution or a medicine called heparin. The need for flushing will depend on how the port is used.  °· If the port is used for intermittent medicines or blood draws, the port will need to be flushed:   °· After medicines have been given.   °· After blood has been drawn.   °· As part of routine maintenance.   °· If a constant infusion is running, the port may not need to be flushed.   °HOW LONG WILL MY PORT STAY IMPLANTED? °The port can stay in for as long as your health care provider thinks it is needed. When it is time for the port to come out, surgery will be done to remove it. The procedure is similar to the one performed when the port was put in.  °WHEN SHOULD I SEEK IMMEDIATE MEDICAL CARE? °When you have an implanted port, you should seek immediate medical care if:  °· You notice a bad smell coming from the incision site.   °· You have swelling, redness, or drainage at the incision site.   °· You have more swelling or pain at the port site or the surrounding area.   °· You have a fever that is not controlled with  medicine. °  °This information is not intended to replace advice given to you by your health care provider. Make sure you discuss any questions you have with your health care provider. °  °Document Released: 05/31/2005 Document Revised: 03/21/2013 Document Reviewed: 02/05/2013 °Elsevier Interactive Patient Education ©2016 Elsevier Inc. ° °

## 2015-07-18 NOTE — Transfer of Care (Signed)
Immediate Anesthesia Transfer of Care Note  Patient: Natalie Ball  Procedure(s) Performed: Procedure(s) with comments: INSERTION PORT-A-CATH (Left) - pt knows to arrive at 6:15  Patient Location: PACU  Anesthesia Type:MAC  Level of Consciousness: awake, alert , oriented and patient cooperative  Airway & Oxygen Therapy: Patient Spontanous Breathing and Patient connected to nasal cannula oxygen  Post-op Assessment: Report given to RN and Post -op Vital signs reviewed and stable  Post vital signs: Reviewed and stable  Last Vitals:  Filed Vitals:   07/18/15 0700 07/18/15 0715  BP: 116/70 144/71  Pulse:    Temp:    Resp: 23 22    Complications: No apparent anesthesia complications

## 2015-07-18 NOTE — Anesthesia Preprocedure Evaluation (Signed)
Anesthesia Evaluation  Patient identified by MRN, date of birth, ID band Patient awake    Reviewed: Allergy & Precautions, NPO status , Patient's Chart, lab work & pertinent test results  History of Anesthesia Complications (+) PONV  Airway Mallampati: II  TM Distance: <3 FB     Dental  (+) Teeth Intact, Caps, Missing,    Pulmonary pneumonia, resolved, former smoker,    Pulmonary exam normal        Cardiovascular Normal cardiovascular exam     Neuro/Psych  Headaches,    GI/Hepatic GERD  Medicated and Controlled,  Endo/Other    Renal/GU      Musculoskeletal   Abdominal Normal abdominal exam  (+)   Peds  Hematology   Anesthesia Other Findings   Reproductive/Obstetrics                             Anesthesia Physical Anesthesia Plan  ASA: II  Anesthesia Plan: MAC   Post-op Pain Management:    Induction: Intravenous  Airway Management Planned: Nasal Cannula  Additional Equipment:   Intra-op Plan:   Post-operative Plan:   Informed Consent: I have reviewed the patients History and Physical, chart, labs and discussed the procedure including the risks, benefits and alternatives for the proposed anesthesia with the patient or authorized representative who has indicated his/her understanding and acceptance.   Dental advisory given  Plan Discussed with: CRNA  Anesthesia Plan Comments:         Anesthesia Quick Evaluation

## 2015-07-18 NOTE — Progress Notes (Signed)
Patient noted to have a tiny left apical pneumothorax. She is asymptomatic. Patient was told about this. She was given strict instructions to return to the emergency room should she develop shortness of breath, significant cough, or left-sided chest pain.

## 2015-07-18 NOTE — Interval H&P Note (Signed)
History and Physical Interval Note:  07/18/2015 7:26 AM  Natalie Ball  has presented today for surgery, with the diagnosis of ovarian cancer  The various methods of treatment have been discussed with the patient and family. After consideration of risks, benefits and other options for treatment, the patient has consented to  Procedure(s) with comments: INSERTION PORT-A-CATH (N/A) - pt knows to arrive at 6:15 as a surgical intervention .  The patient's history has been reviewed, patient examined, no change in status, stable for surgery.  I have reviewed the patient's chart and labs.  Questions were answered to the patient's satisfaction.     Aviva Signs A

## 2015-07-18 NOTE — Anesthesia Postprocedure Evaluation (Signed)
Anesthesia Post Note  Patient: Natalie Ball  Procedure(s) Performed: Procedure(s) (LRB): INSERTION PORT-A-CATH (Left)  Patient location during evaluation: PACU Anesthesia Type: MAC Level of consciousness: awake and alert and oriented Pain management: pain level controlled Vital Signs Assessment: post-procedure vital signs reviewed and stable Respiratory status: spontaneous breathing and patient connected to nasal cannula oxygen Cardiovascular status: stable Postop Assessment: no signs of nausea or vomiting Anesthetic complications: no    Last Vitals:  Filed Vitals:   07/18/15 0700 07/18/15 0715  BP: 116/70 144/71  Pulse:    Temp:    Resp: 23 22    Last Pain: There were no vitals filed for this visit.               Kla Bily A

## 2015-07-18 NOTE — Anesthesia Procedure Notes (Signed)
Procedure Name: MAC Date/Time: 07/18/2015 7:32 AM Performed by: Andree Elk, Avaleigh Decuir A Pre-anesthesia Checklist: Patient identified, Timeout performed, Emergency Drugs available, Suction available and Patient being monitored Patient Re-evaluated:Patient Re-evaluated prior to inductionOxygen Delivery Method: Simple face mask

## 2015-07-18 NOTE — Patient Instructions (Signed)
Premeds: Zofran - for nausea/vomiting prevention/reduction. Dexamethasone - steroid - given to reduce the risk of you having an allergic type reaction to the chemotherapy. Dex can cause you to feel energized, nervous/anxious/jittery, make you have trouble sleeping, and/or make you feel hot/flushed in the face/neck and/or look pink/red in the face/neck. These side effects will pass as the Dex wears off. (takes 30 minutes to infuse)  Oxaliplatin - anaphylactic reaction, neurotoxicity (i.e., headache, fatigue, difficulty sleeping, pain). Peripheral neuropathy (numbness/tingling/burning in hands/fingers/feet/toes) - will be aggravated by cold/cool temperatures. We need to know when you develop peripheral neuropathy so that we can monitor it and treat if necessary. Nausea/vomiting, diarrhea, bone marrow suppression (lowers white blood cells (fight infection), lowers red blood cells (make up your blood), lowers platelets (help blood to clot). Pulmonary fibrosis. Once you have received Oxaliplatin do NOT eat or drinking anything cold/cool for 5-10 days! Do NOT breathe in cold/cool air and do NOT touch anything cold for 5-10 days. The time frame varies from patient to patient on the length of time you must abstain from the above mentioned. Best advice is to wait at least 5 days before attempting to reintroduce cold/cool back into life. Slowly reintroduce cool/cold things! Wear gloves when getting items out of the refrigerator (of course, these would be things you are going to heat to eat)!      (takes 2 hours to infuse)  Leucovorin - this is a medication that is not chemo but given with chemo. This med "rescues" the healthy cells before we administer the drug 5FU. This makes the 5FU work better. (takes 2 hours to infuse- this goes in @ the same time as the Oxaliplatin or Irinotecan chemo)  5FU: bone marrow suppression (low white blood cells - wbcs fight infection, low red blood cells - rbcs make up your blood, low  platelets - this is what makes your blood clot, nausea/vomiting, diarrhea, mouth sores, hair loss, dry skin, ocular toxicities (increased tear production, sensitivity to light). You must wear sunscreen/sunglasses. Cover your skin when out in sunlight. You will get burned very easily. The nurse will sit @ your bedside and push this medication in via a syringe. This will take 10 minutes. Then she will connect you to an ambulatory pump with this medication attached. You will wear the pump for 46 hours. The 5FU chemo will infuse for 46 hours total. Then the pump will be removed).    POTENTIAL SIDE EFFECTS OF TREATMENT: Increased Susceptibility to Infection, Vomiting, Constipation, Hair Thinning, Changes in Character of Skin and Nails (brittleness, dryness,etc.), Bone Marrow Suppression, Abdominal Cramping, Nausea, Diarrhea, Sun Sensitivity and Mouth Sores   EDUCATIONAL MATERIALS GIVEN AND REVIEWED: Chemotherapy and You booklet Specific Instructions Sheets: Oxaliplatin, 5FU, Leucovorin, Zofran, Dexamethasone, EMLA cream, Zofran tablets, Compazine tablets   SELF CARE ACTIVITIES WHILE ON CHEMOTHERAPY: Increase your fluid intake 48 hours prior to treatment and drink at least 2 quarts per day after treatment., No alcohol intake., No aspirin or other medications unless approved by your oncologist., Eat foods that are light and easy to digest., Eat foods at cold or room temperature., No fried, fatty, or spicy foods immediately before or after treatment., Have teeth cleaned professionally before starting treatment. Keep dentures and partial plates clean., Use soft toothbrush and do not use mouthwashes that contain alcohol. Biotene is a good mouthwash that is available at most pharmacies or may be ordered by calling [phone]., Use warm salt water gargles (1 teaspoon salt per 1 quart warm water) before  and after meals and at bedtime. Or you may rinse with 2 tablespoons of three -percent hydrogen peroxide mixed  in eight ounces of water., Always use sunscreen with SPF (Sun Protection Factor) of 30 or higher., Use your nausea medication as directed to prevent nausea., Use your stool softener or laxative as directed to prevent constipation. and Use your anti-diarrheal medication as directed to stop diarrhea.  Please wash your hands for at least 30 seconds using warm soapy water. Handwashing is the #1 way to prevent the spread of germs. Stay away from sick people or people who are getting over a cold. If you develop respiratory systems such as green/yellow mucus production or productive cough or persistent cough let us know and we will see if you need an antibiotic. It is a good idea to keep a pair of gloves on when going into grocery stores/[employer] to decrease your risk of coming into contact with germs on the carts, etc. Carry alcohol hand gel with you at all times and use it frequently if out in public. All foods need to be cooked thoroughly. No raw foods. No medium or undercooked meats, eggs. If your food is cooked medium well, it does not need to be hot pink or saturated with bloody liquid at all. Vegetables and fruits need to be washed/rinsed under the faucet with a dish detergent before being consumed. You can eat raw fruits and vegetables unless we tell you otherwise but it would be best if you cooked them or bought frozen. Do not eat off of salad bars or hot bars unless you really trust the cleanliness of the restaurant. If you need dental work, please let Dr. Whitney Muse know before you go for your appointment so that we can coordinate the best possible time for you in regards to your chemo regimen. You need to also let your dentist know that you are actively taking chemo. We may need to do labs prior to your dental appointment. We also want your bowels moving at least every other day. If this is not happening, we need to know so that we can get you on a bowel regimen to help you go. If you are going to have sex, your  partner must wear a condom. This is to protect them from potential chemotherapy exposure. This will need to continue for 28 days post completion of your final chemotherapy.   MEDICATIONS: You have been given prescriptions for the following medications:  Zofran/Ondansetron 8mg  tablet. Take 1 tablet every 8 hours as needed for nausea/vomiting. (#1 for nausea but can cause constipation)  Compazine/Prochlorperazine 10mg  tablet. Take 1 tablet every 6 hours as needed for nausea/vomiting. (#2 for nausea but can cause drowsiness)  EMLA cream. Appply a quarter size amount to port site 1 hour prior to chemo. Do not rub in. Cover with plastic wrap.   SYMPTOMS TO REPORT AS SOON AS POSSIBLE AFTER TREATMENT:  FEVER GREATER THAN 100.5 F  CHILLS WITH OR WITHOUT FEVER  NAUSEA AND VOMITING THAT IS NOT CONTROLLED WITH YOUR NAUSEA MEDICATION  UNUSUAL SHORTNESS OF BREATH  UNUSUAL BRUISING OR BLEEDING  TENDERNESS IN MOUTH AND THROAT WITH OR WITHOUT PRESENCE OF ULCERS  URINARY PROBLEMS  BOWEL PROBLEMS  UNUSUAL RASH    Wear comfortable clothing and clothing appropriate for easy access to any Portacath or PICC line. Let us know if there is anything that we can do to make your therapy better!      I have been informed and understand all of the  instructions given to me and have received a copy. I have been instructed to call the clinic [phone] or my family physician as soon as possible for continued medical care, if indicated. I do not have any more questions at this time but understand that I may call the [facility] or the Patient Navigator at [phone] during office hours should I have questions or need assistance in obtaining follow-up care.      Oxaliplatin Injection What is this medicine? OXALIPLATIN (ox AL i PLA tin) is a chemotherapy drug. It targets fast dividing cells, like cancer cells, and causes these cells to die. This medicine is used to treat cancers of the colon  and rectum, and many other cancers. This medicine may be used for other purposes; ask your health care provider or pharmacist if you have questions. What should I tell my health care provider before I take this medicine? They need to know if you have any of these conditions: -kidney disease -an unusual or allergic reaction to oxaliplatin, other chemotherapy, other medicines, foods, dyes, or preservatives -pregnant or trying to get pregnant -breast-feeding How should I use this medicine? This drug is given as an infusion into a vein. It is administered in a hospital or clinic by a specially trained health care professional. Talk to your pediatrician regarding the use of this medicine in children. Special care may be needed. Overdosage: If you think you have taken too much of this medicine contact a poison control center or emergency room at once. NOTE: This medicine is only for you. Do not share this medicine with others. What if I miss a dose? It is important not to miss a dose. Call your doctor or health care professional if you are unable to keep an appointment. What may interact with this medicine? -medicines to increase blood counts like filgrastim, pegfilgrastim, sargramostim -probenecid -some antibiotics like amikacin, gentamicin, neomycin, polymyxin B, streptomycin, tobramycin -zalcitabine Talk to your doctor or health care professional before taking any of these medicines: -acetaminophen -aspirin -ibuprofen -ketoprofen -naproxen This list may not describe all possible interactions. Give your health care provider a list of all the medicines, herbs, non-prescription drugs, or dietary supplements you use. Also tell them if you smoke, drink alcohol, or use illegal drugs. Some items may interact with your medicine. What should I watch for while using this medicine? Your condition will be monitored carefully while you are receiving this medicine. You will need important blood work done  while you are taking this medicine. This medicine can make you more sensitive to cold. Do not drink cold drinks or use ice. Cover exposed skin before coming in contact with cold temperatures or cold objects. When out in cold weather wear warm clothing and cover your mouth and nose to warm the air that goes into your lungs. Tell your doctor if you get sensitive to the cold. This drug may make you feel generally unwell. This is not uncommon, as chemotherapy can affect healthy cells as well as cancer cells. Report any side effects. Continue your course of treatment even though you feel ill unless your doctor tells you to stop. In some cases, you may be given additional medicines to help with side effects. Follow all directions for their use. Call your doctor or health care professional for advice if you get a fever, chills or sore throat, or other symptoms of a cold or flu. Do not treat yourself. This drug decreases your body's ability to fight infections. Try to  avoid being around people who are sick. This medicine may increase your risk to bruise or bleed. Call your doctor or health care professional if you notice any unusual bleeding. Be careful brushing and flossing your teeth or using a toothpick because you may get an infection or bleed more easily. If you have any dental work done, tell your dentist you are receiving this medicine. Avoid taking products that contain aspirin, acetaminophen, ibuprofen, naproxen, or ketoprofen unless instructed by your doctor. These medicines may hide a fever. Do not become pregnant while taking this medicine. Women should inform their doctor if they wish to become pregnant or think they might be pregnant. There is a potential for serious side effects to an unborn child. Talk to your health care professional or pharmacist for more information. Do not breast-feed an infant while taking this medicine. Call your doctor or health care professional if you get diarrhea. Do not  treat yourself. What side effects may I notice from receiving this medicine? Side effects that you should report to your doctor or health care professional as soon as possible: -allergic reactions like skin rash, itching or hives, swelling of the face, lips, or tongue -low blood counts - This drug may decrease the number of white blood cells, red blood cells and platelets. You may be at increased risk for infections and bleeding. -signs of infection - fever or chills, cough, sore throat, pain or difficulty passing urine -signs of decreased platelets or bleeding - bruising, pinpoint red spots on the skin, black, tarry stools, nosebleeds -signs of decreased red blood cells - unusually weak or tired, fainting spells, lightheadedness -breathing problems -chest pain, pressure -cough -diarrhea -jaw tightness -mouth sores -nausea and vomiting -pain, swelling, redness or irritation at the injection site -pain, tingling, numbness in the hands or feet -problems with balance, talking, walking -redness, blistering, peeling or loosening of the skin, including inside the mouth -trouble passing urine or change in the amount of urine Side effects that usually do not require medical attention (report to your doctor or health care professional if they continue or are bothersome): -changes in vision -constipation -hair loss -loss of appetite -metallic taste in the mouth or changes in taste -stomach pain This list may not describe all possible side effects. Call your doctor for medical advice about side effects. You may report side effects to FDA at 1-800-FDA-1088. Where should I keep my medicine? This drug is given in a hospital or clinic and will not be stored at home. NOTE: This sheet is a summary. It may not cover all possible information. If you have questions about this medicine, talk to your doctor, pharmacist, or health care provider.    2016, Elsevier/[employer] Standard. (2007-12-26  17:22:47) Leucovorin injection What is this medicine? LEUCOVORIN (loo koe VOR in) is used to prevent or treat the harmful effects of some medicines. This medicine is used to treat anemia caused by a low amount of folic acid in the body. It is also used with 5-fluorouracil (5-FU) to treat colon cancer. This medicine may be used for other purposes; ask your health care provider or pharmacist if you have questions. What should I tell my health care provider before I take this medicine? They need to know if you have any of these conditions: -anemia from low levels of vitamin B-12 in the blood -an unusual or allergic reaction to leucovorin, folic acid, other medicines, foods, dyes, or preservatives -pregnant or trying to get pregnant -breast-feeding How should I use this medicine?  This medicine is for injection into a muscle or into a vein. It is given by a health care professional in a hospital or clinic setting. Talk to your pediatrician regarding the use of this medicine in children. Special care may be needed. Overdosage: If you think you have taken too much of this medicine contact a poison control center or emergency room at once. NOTE: This medicine is only for you. Do not share this medicine with others. What if I miss a dose? This does not apply. What may interact with this medicine? -capecitabine -fluorouracil -phenobarbital -phenytoin -primidone -trimethoprim-sulfamethoxazole This list may not describe all possible interactions. Give your health care provider a list of all the medicines, herbs, non-prescription drugs, or dietary supplements you use. Also tell them if you smoke, drink alcohol, or use illegal drugs. Some items may interact with your medicine. What should I watch for while using this medicine? Your condition will be monitored carefully while you are receiving this medicine. This medicine may increase the side effects of 5-fluorouracil, 5-FU. Tell your doctor or health  care professional if you have diarrhea or mouth sores that do not get better or that get worse. What side effects may I notice from receiving this medicine? Side effects that you should report to your doctor or health care professional as soon as possible: -allergic reactions like skin rash, itching or hives, swelling of the face, lips, or tongue -breathing problems -fever, infection -mouth sores -unusual bleeding or bruising -unusually weak or tired Side effects that usually do not require medical attention (report to your doctor or health care professional if they continue or are bothersome): -constipation or diarrhea -loss of appetite -nausea, vomiting This list may not describe all possible side effects. Call your doctor for medical advice about side effects. You may report side effects to FDA at 1-800-FDA-1088. Where should I keep my medicine? This drug is given in a hospital or clinic and will not be stored at home. NOTE: This sheet is a summary. It may not cover all possible information. If you have questions about this medicine, talk to your doctor, pharmacist, or health care provider.    2016, Else[employer] Standard. (2007-12-05 16:50:29) Fluorouracil, 5-FU injection What is this medicine? FLUOROURACIL, 5-FU (flure oh YOOR a sil) is a chemotherapy drug. It slows the growth of cancer cells. This medicine is used to treat many types of cancer like breast cancer, colon or rectal cancer, pancreatic cancer, and stomach cancer. This medicine may be used for other purposes; ask your health care provider or pharmacist if you have questions. What should I tell my health care provider before I take this medicine? They need to know if you have any of these conditions: -blood disorders -dihydropyrimidine dehydrogenase (DPD) deficiency -infection (especially a virus infection such as chickenpox, cold sores, or herpes) -kidney disease -liver disease -malnourished, poor nutrition -recent or  ongoing radiation therapy -an unusual or allergic reaction to fluorouracil, other chemotherapy, other medicines, foods, dyes, or preservatives -pregnant or trying to get pregnant -breast-feeding How should I use this medicine? This drug is given as an infusion or injection into a vein. It is administered in a hospital or clinic by a specially trained health care professional. Talk to your pediatrician regarding the use of this medicine in children. Special care may be needed. Overdosage: If you think you have taken too much of this medicine contact a poison control center or emergency room at once. NOTE: This medicine is only for you. Do not share  this medicine with others. What if I miss a dose? It is important not to miss your dose. Call your doctor or health care professional if you are unable to keep an appointment. What may interact with this medicine? -allopurinol -cimetidine -dapsone -digoxin -hydroxyurea -leucovorin -levamisole -medicines for seizures like ethotoin, fosphenytoin, phenytoin -medicines to increase blood counts like filgrastim, pegfilgrastim, sargramostim -medicines that treat or prevent blood clots like warfarin, enoxaparin, and dalteparin -methotrexate -metronidazole -pyrimethamine -some other chemotherapy drugs like busulfan, cisplatin, estramustine, vinblastine -trimethoprim -trimetrexate -vaccines Talk to your doctor or health care professional before taking any of these medicines: -acetaminophen -aspirin -ibuprofen -ketoprofen -naproxen This list may not describe all possible interactions. Give your health care provider a list of all the medicines, herbs, non-prescription drugs, or dietary supplements you use. Also tell them if you smoke, drink alcohol, or use illegal drugs. Some items may interact with your medicine. What should I watch for while using this medicine? Visit your doctor for checks on your progress. This drug may make you feel generally  unwell. This is not uncommon, as chemotherapy can affect healthy cells as well as cancer cells. Report any side effects. Continue your course of treatment even though you feel ill unless your doctor tells you to stop. In some cases, you may be given additional medicines to help with side effects. Follow all directions for their use. Call your doctor or health care professional for advice if you get a fever, chills or sore throat, or other symptoms of a cold or flu. Do not treat yourself. This drug decreases your body's ability to fight infections. Try to avoid being around people who are sick. This medicine may increase your risk to bruise or bleed. Call your doctor or health care professional if you notice any unusual bleeding. Be careful brushing and flossing your teeth or using a toothpick because you may get an infection or bleed more easily. If you have any dental work done, tell your dentist you are receiving this medicine. Avoid taking products that contain aspirin, acetaminophen, ibuprofen, naproxen, or ketoprofen unless instructed by your doctor. These medicines may hide a fever. Do not become pregnant while taking this medicine. Women should inform their doctor if they wish to become pregnant or think they might be pregnant. There is a potential for serious side effects to an unborn child. Talk to your health care professional or pharmacist for more information. Do not breast-feed an infant while taking this medicine. Men should inform their doctor if they wish to father a child. This medicine may lower sperm counts. Do not treat diarrhea with over the counter products. Contact your doctor if you have diarrhea that lasts more than 2 days or if it is severe and watery. This medicine can make you more sensitive to the sun. Keep out of the sun. If you cannot avoid being in the sun, wear protective clothing and use sunscreen. Do not use sun lamps or tanning beds/booths. What side effects may I notice  from receiving this medicine? Side effects that you should report to your doctor or health care professional as soon as possible: -allergic reactions like skin rash, itching or hives, swelling of the face, lips, or tongue -low blood counts - this medicine may decrease the number of white blood cells, red blood cells and platelets. You may be at increased risk for infections and bleeding. -signs of infection - fever or chills, cough, sore throat, pain or difficulty passing urine -signs of decreased platelets or bleeding -  bruising, pinpoint red spots on the skin, black, tarry stools, blood in the urine -signs of decreased red blood cells - unusually weak or tired, fainting spells, lightheadedness -breathing problems -changes in vision -chest pain -mouth sores -nausea and vomiting -pain, swelling, redness at site where injected -pain, tingling, numbness in the hands or feet -redness, swelling, or sores on hands or feet -stomach pain -unusual bleeding Side effects that usually do not require medical attention (report to your doctor or health care professional if they continue or are bothersome): -changes in finger or toe nails -diarrhea -dry or itchy skin -hair loss -headache -loss of appetite -sensitivity of eyes to the light -stomach upset -unusually teary eyes This list may not describe all possible side effects. Call your doctor for medical advice about side effects. You may report side effects to FDA at 1-800-FDA-1088. Where should I keep my medicine? This drug is given in a hospital or clinic and will not be stored at home. NOTE: This sheet is a summary. It may not cover all possible information. If you have questions about this medicine, talk to your doctor, pharmacist, or health care provider.    2016, [employer]. (2007-10-04 13:53:16) Ondansetron injection What is this medicine? ONDANSETRON (on DAN se tron) is used to treat nausea and vomiting caused by  chemotherapy. It is also used to prevent or treat nausea and vomiting after surgery. This medicine may be used for other purposes; ask your health care provider or pharmacist if you have questions. What should I tell my health care provider before I take this medicine? They need to know if you have any of these conditions: -heart disease -history of irregular heartbeat -liver disease -low levels of magnesium or potassium in the blood -an unusual or allergic reaction to ondansetron, granisetron, other medicines, foods, dyes, or preservatives -pregnant or trying to get pregnant -breast-feeding How should I use this medicine? This medicine is for infusion into a vein. It is given by a health care professional in a hospital or clinic setting. Talk to your pediatrician regarding the use of this medicine in children. Special care may be needed. Overdosage: If you think you have taken too much of this medicine contact a poison control center or emergency room at once. NOTE: This medicine is only for you. Do not share this medicine with others. What if I miss a dose? This does not apply. What may interact with this medicine? Do not take this medicine with any of the following medications: -apomorphine -certain medicines for fungal infections like fluconazole, itraconazole, ketoconazole, posaconazole, voriconazole -cisapride -dofetilide -dronedarone -pimozide -thioridazine -ziprasidone This medicine may also interact with the following medications: -carbamazepine -certain medicines for depression, anxiety, or psychotic disturbances -fentanyl -linezolid -MAOIs like Carbex, Eldepryl, Marplan, Nardil, and Parnate -methylene blue (injected into a vein) -other medicines that prolong the QT interval (cause an abnormal heart rhythm) -phenytoin -rifampicin -tramadol This list may not describe all possible interactions. Give your health care provider a list of all the medicines, herbs,  non-prescription drugs, or dietary supplements you use. Also tell them if you smoke, drink alcohol, or use illegal drugs. Some items may interact with your medicine. What should I watch for while using this medicine? Your condition will be monitored carefully while you are receiving this medicine. What side effects may I notice from receiving this medicine? Side effects that you should report to your doctor or health care professional as soon as possible: -allergic reactions like skin rash, itching or hives, swelling of  the face, lips, or tongue -breathing problems -confusion -dizziness -fast or irregular heartbeat -feeling faint or lightheaded, falls -fever and chills -loss of balance or coordination -seizures -sweating -swelling of the hands and feet -tightness in the chest -tremors -unusually weak or tired Side effects that usually do not require medical attention (report to your doctor or health care professional if they continue or are bothersome): -constipation or diarrhea -headache This list may not describe all possible side effects. Call your doctor for medical advice about side effects. You may report side effects to FDA at 1-800-FDA-1088. Where should I keep my medicine? This drug is given in a hospital or clinic and will not be stored at home. NOTE: This sheet is a summary. It may not cover all possible information. If you have questions about this medicine, talk to your doctor, pharmacist, or health care provider.    2016, [employer]. (2013-03-07 16:18:28) Dexamethasone injection What is this medicine? DEXAMETHASONE (dex a METH a sone) is a corticosteroid. It is used to treat inflammation of the skin, joints, lungs, and other organs. Common conditions treated include asthma, allergies, and arthritis. It is also used for other conditions, like blood disorders and diseases of the adrenal glands. This medicine may be used for other purposes; ask your health care  provider or pharmacist if you have questions. What should I tell my health care provider before I take this medicine? They need to know if you have any of these conditions: -blood clotting problems -Cushing's syndrome -diabetes -glaucoma -heart problems or disease -high blood pressure -infection like herpes, measles, tuberculosis, or chickenpox -kidney disease -liver disease -mental problems -myasthenia gravis -osteoporosis -previous heart attack -seizures -stomach, ulcer or intestine disease including colitis and diverticulitis -thyroid problem -an unusual or allergic reaction to dexamethasone, corticosteroids, other medicines, lactose, foods, dyes, or preservatives -pregnant or trying to get pregnant -breast-feeding How should I use this medicine? This medicine is for injection into a muscle, joint, lesion, soft tissue, or vein. It is given by a health care professional in a hospital or clinic setting. Talk to your pediatrician regarding the use of this medicine in children. Special care may be needed. Overdosage: If you think you have taken too much of this medicine contact a poison control center or emergency room at once. NOTE: This medicine is only for you. Do not share this medicine with others. What if I miss a dose? This may not apply. If you are having a series of injections over a prolonged period, try not to miss an appointment. Call your doctor or health care professional to reschedule if you are unable to keep an appointment. What may interact with this medicine? Do not take this medicine with any of the following medications: -mifepristone, RU-486 -vaccines This medicine may also interact with the following medications: -amphotericin B -antibiotics like clarithromycin, erythromycin, and troleandomycin -aspirin and aspirin-like drugs -barbiturates like phenobarbital -carbamazepine -cholestyramine -cholinesterase inhibitors like donepezil, galantamine,  rivastigmine, and tacrine -cyclosporine -digoxin -diuretics -ephedrine -female hormones, like estrogens or progestins and birth control pills -indinavir -isoniazid -ketoconazole -medicines for diabetes -medicines that improve muscle tone or strength for conditions like myasthenia gravis -NSAIDs, medicines for pain and inflammation, like ibuprofen or naproxen -phenytoin -rifampin -thalidomide -warfarin This list may not describe all possible interactions. Give your health care provider a list of all the medicines, herbs, non-prescription drugs, or dietary supplements you use. Also tell them if you smoke, drink alcohol, or use illegal drugs. Some items may interact with your medicine. What  should I watch for while using this medicine? Your condition will be monitored carefully while you are receiving this medicine. If you are taking this medicine for a long time, carry an identification card with your name and address, the type and dose of your medicine, and your doctor's name and address. This medicine may increase your risk of getting an infection. Stay away from people who are sick. Tell your doctor or health care professional if you are around anyone with measles or chickenpox. Talk to your health care provider before you get any vaccines that you take this medicine. If you are going to have surgery, tell your doctor or health care professional that you have taken this medicine within the last twelve months. Ask your doctor or health care professional about your diet. You may need to lower the amount of salt you eat. The medicine can increase your blood sugar. If you are a diabetic check with your doctor if you need help adjusting the dose of your diabetic medicine. What side effects may I notice from receiving this medicine? Side effects that you should report to your doctor or health care professional as soon as possible: -allergic reactions like skin rash, itching or hives, swelling of  the face, lips, or tongue -black or tarry stools -change in the amount of urine -changes in vision -confusion, excitement, restlessness, a false sense of well-being -fever, sore throat, sneezing, cough, or other signs of infection, wounds that will not heal -hallucinations -increased thirst -mental depression, mood swings, mistaken feelings of self importance or of being mistreated -pain in hips, back, ribs, arms, shoulders, or legs -pain, redness, or irritation at the injection site -redness, blistering, peeling or loosening of the skin, including inside the mouth -rounding out of face -swelling of feet or lower legs -unusual bleeding or bruising -unusual tired or weak -wounds that do not heal Side effects that usually do not require medical attention (report to your doctor or health care professional if they continue or are bothersome): -diarrhea or constipation -change in taste -headache -nausea, vomiting -skin problems, acne, thin and shiny skin -touble sleeping -unusual growth of hair on the face or body -weight gain This list may not describe all possible side effects. Call your doctor for medical advice about side effects. You may report side effects to FDA at 1-800-FDA-1088. Where should I keep my medicine? This drug is given in a hospital or clinic and will not be stored at home. NOTE: This sheet is a summary. It may not cover all possible information. If you have questions about this medicine, talk to your doctor, pharmacist, or health care provider.    2016, [employer] Standard. (2007-09-21 14:04:12) Lidocaine; Prilocaine cream What is this medicine? LIDOCAINE; PRILOCAINE (LYE doe kane; PRIL oh kane) is a topical anesthetic that causes loss of feeling in the skin and surrounding tissues. It is used to numb the skin before procedures or injections. This medicine may be used for other purposes; ask your health care provider or pharmacist if you have questions. What  should I tell my health care provider before I take this medicine? They need to know if you have any of these conditions: -glucose-6-phosphate deficiencies -heart disease -kidney or liver disease -methemoglobinemia -an unusual or allergic reaction to lidocaine, prilocaine, other medicines, foods, dyes, or preservatives -pregnant or trying to get pregnant -breast-feeding How should I use this medicine? This medicine is for external use only on the skin. Do not take by mouth. Follow the directions on the  prescription label. Wash hands before and after use. Do not use more or leave in contact with the skin longer than directed. Do not apply to eyes or open wounds. It can cause irritation and blurred or temporary loss of vision. If this medicine comes in contact with your eyes, immediately rinse the eye with water. Do not touch or rub the eye. Contact your health care provider right away. Talk to your pediatrician regarding the use of this medicine in children. While this medicine may be prescribed for children for selected conditions, precautions do apply. Overdosage: If you think you have taken too much of this medicine contact a poison control center or emergency room at once. NOTE: This medicine is only for you. Do not share this medicine with others. What if I miss a dose? This medicine is usually only applied once prior to each procedure. It must be in contact with the skin for a period of time for it to work. If you applied this medicine later than directed, tell your health care professional before starting the procedure. What may interact with this medicine? -acetaminophen -chloroquine -dapsone -medicines to control heart rhythm -nitrates like nitroglycerin and nitroprusside -other ointments, creams, or sprays that may contain anesthetic medicine -phenobarbital -phenytoin -quinine -sulfonamides like sulfacetamide, sulfamethoxazole, sulfasalazine and others This list may not describe  all possible interactions. Give your health care provider a list of all the medicines, herbs, non-prescription drugs, or dietary supplements you use. Also tell them if you smoke, drink alcohol, or use illegal drugs. Some items may interact with your medicine. What should I watch for while using this medicine? Be careful to avoid injury to the treated area while it is numb and you are not aware of pain. Avoid scratching, rubbing, or exposing the treated area to hot or cold temperatures until complete sensation has returned. The numb feeling will wear off a few hours after applying the cream. What side effects may I notice from receiving this medicine? Side effects that you should report to your doctor or health care professional as soon as possible: -blurred vision -chest pain -difficulty breathing -dizziness -drowsiness -fast or irregular heartbeat -skin rash or itching -swelling of your throat, lips, or face -trembling Side effects that usually do not require medical attention (report to your doctor or health care professional if they continue or are bothersome): -changes in ability to feel hot or cold -redness and swelling at the application site This list may not describe all possible side effects. Call your doctor for medical advice about side effects. You may report side effects to FDA at 1-800-FDA-1088. Where should I keep my medicine? Keep out of reach of children. Store at room temperature between 15 and 30 degrees C (59 and 86 degrees F). Keep container tightly closed. Throw away any unused medicine after the expiration date. NOTE: This sheet is a summary. It may not cover all possible information. If you have questions about this medicine, talk to your doctor, pharmacist, or health care provider.    2016, [employer] Standard. (2007-12-04 17:14:35) Ondansetron tablets What is this medicine? ONDANSETRON (on DAN se tron) is used to treat nausea and vomiting caused by  chemotherapy. It is also used to prevent or treat nausea and vomiting after surgery. This medicine may be used for other purposes; ask your health care provider or pharmacist if you have questions. What should I tell my health care provider before I take this medicine? They need to know if you have any of these conditions: -  heart disease -history of irregular heartbeat -liver disease -low levels of magnesium or potassium in the blood -an unusual or allergic reaction to ondansetron, granisetron, other medicines, foods, dyes, or preservatives -pregnant or trying to get pregnant -breast-feeding How should I use this medicine? Take this medicine by mouth with a glass of water. Follow the directions on your prescription label. Take your doses at regular intervals. Do not take your medicine more often than directed. Talk to your pediatrician regarding the use of this medicine in children. Special care may be needed. Overdosage: If you think you have taken too much of this medicine contact a poison control center or emergency room at once. NOTE: This medicine is only for you. Do not share this medicine with others. What if I miss a dose? If you miss a dose, take it as soon as you can. If it is almost time for your next dose, take only that dose. Do not take double or extra doses. What may interact with this medicine? Do not take this medicine with any of the following medications: -apomorphine -certain medicines for fungal infections like fluconazole, itraconazole, ketoconazole, posaconazole, voriconazole -cisapride -dofetilide -dronedarone -pimozide -thioridazine -ziprasidone This medicine may also interact with the following medications: -carbamazepine -certain medicines for depression, anxiety, or psychotic disturbances -fentanyl -linezolid -MAOIs like Carbex, Eldepryl, Marplan, Nardil, and Parnate -methylene blue (injected into a vein) -other medicines that prolong the QT interval  (cause an abnormal heart rhythm) -phenytoin -rifampicin -tramadol This list may not describe all possible interactions. Give your health care provider a list of all the medicines, herbs, non-prescription drugs, or dietary supplements you use. Also tell them if you smoke, drink alcohol, or use illegal drugs. Some items may interact with your medicine. What should I watch for while using this medicine? Check with your doctor or health care professional right away if you have any sign of an allergic reaction. What side effects may I notice from receiving this medicine? Side effects that you should report to your doctor or health care professional as soon as possible: -allergic reactions like skin rash, itching or hives, swelling of the face, lips or tongue -breathing problems -confusion -dizziness -fast or irregular heartbeat -feeling faint or lightheaded, falls -fever and chills -loss of balance or coordination -seizures -sweating -swelling of the hands or feet -tightness in the chest -tremors -unusually weak or tired Side effects that usually do not require medical attention (report to your doctor or health care professional if they continue or are bothersome): -constipation or diarrhea -headache This list may not describe all possible side effects. Call your doctor for medical advice about side effects. You may report side effects to FDA at 1-800-FDA-1088. Where should I keep my medicine? Keep out of the reach of children. Store between 2 and 30 degrees C (36 and 86 degrees F). Throw away any unused medicine after the expiration date. NOTE: This sheet is a summary. It may not cover all possible information. If you have questions about this medicine, talk to your doctor, pharmacist, or health care provider.    2016, [employer] Standard. (2013-03-07 16:27:45) Prochlorperazine tablets What is this medicine? PROCHLORPERAZINE (proe klor PER a zeen) helps to control severe nausea and  vomiting. This medicine is also used to treat schizophrenia. It can also help patients who experience anxiety that is not due to psychological illness. This medicine may be used for other purposes; ask your health care provider or pharmacist if you have questions. What should I tell my health care  provider before I take this medicine? They need to know if you have any of these conditions: -blood disorders or disease -dementia -liver disease or jaundice -Parkinson's disease -uncontrollable movement disorder -an unusual or allergic reaction to prochlorperazine, other medicines, foods, dyes, or preservatives -pregnant or trying to get pregnant -breast-feeding How should I use this medicine? Take this medicine by mouth with a glass of water. Follow the directions on the prescription label. Take your doses at regular intervals. Do not take your medicine more often than directed. Do not stop taking this medicine suddenly. This can cause nausea, vomiting, and dizziness. Ask your doctor or health care professional for advice. Talk to your pediatrician regarding the use of this medicine in children. Special care may be needed. While this drug may be prescribed for children as young as 2 years for selected conditions, precautions do apply. Overdosage: If you think you have taken too much of this medicine contact a poison control center or emergency room at once. NOTE: This medicine is only for you. Do not share this medicine with others. What if I miss a dose? If you miss a dose, take it as soon as you can. If it is almost time for your next dose, take only that dose. Do not take double or extra doses. What may interact with this medicine? Do not take this medicine with any of the following medications: -amoxapine -antidepressants like citalopram, escitalopram, fluoxetine, paroxetine, and sertraline -deferoxamine -dofetilide -maprotiline -tricyclic antidepressants like amitriptyline, clomipramine,  imipramine, nortiptyline and others This medicine may also interact with the following medications: -lithium -medicines for pain -phenytoin -propranolol -warfarin This list may not describe all possible interactions. Give your health care provider a list of all the medicines, herbs, non-prescription drugs, or dietary supplements you use. Also tell them if you smoke, drink alcohol, or use illegal drugs. Some items may interact with your medicine. What should I watch for while using this medicine? Visit your doctor or health care professional for regular checks on your progress. You may get drowsy or dizzy. Do not drive, use machinery, or do anything that needs mental alertness until you know how this medicine affects you. Do not stand or sit up quickly, especially if you are an older patient. This reduces the risk of dizzy or fainting spells. Alcohol may interfere with the effect of this medicine. Avoid alcoholic drinks. This medicine can reduce the response of your body to heat or cold. Dress warm in cold weather and stay hydrated in hot weather. If possible, avoid extreme temperatures like saunas, hot tubs, very hot or cold showers, or activities that can cause dehydration such as vigorous exercise. This medicine can make you more sensitive to the sun. Keep out of the sun. If you cannot avoid being in the sun, wear protective clothing and use sunscreen. Do not use sun lamps or tanning beds/booths. Your mouth may get dry. Chewing sugarless gum or sucking hard candy, and drinking plenty of water may help. Contact your doctor if the problem does not go away or is severe. What side effects may I notice from receiving this medicine? Side effects that you should report to your doctor or health care professional as soon as possible: -blurred vision -breast enlargement in men or women -breast milk in women who are not breast-feeding -chest pain, fast or irregular heartbeat -confusion,  restlessness -dark yellow or brown urine -difficulty breathing or swallowing -dizziness or fainting spells -drooling, shaking, movement difficulty (shuffling walk) or rigidity -fever, chills, sore throat -  involuntary or uncontrollable movements of the eyes, mouth, head, arms, and legs -seizures -stomach area pain -unusually weak or tired -unusual bleeding or bruising -yellowing of skin or eyes Side effects that usually do not require medical attention (report to your doctor or health care professional if they continue or are bothersome): -difficulty passing urine -difficulty sleeping -headache -sexual dysfunction -skin rash, or itching This list may not describe all possible side effects. Call your doctor for medical advice about side effects. You may report side effects to FDA at 1-800-FDA-1088. Where should I keep my medicine? Keep out of the reach of children. Store at room temperature between 15 and 30 degrees C (59 and 86 degrees F). Protect from light. Throw away any unused medicine after the expiration date. NOTE: This sheet is a summary. It may not cover all possible information. If you have questions about this medicine, talk to your doctor, pharmacist, or health care provider.    2016, [employer] Standard. (2011-10-19 16:59:39)

## 2015-07-21 ENCOUNTER — Encounter (HOSPITAL_COMMUNITY): Payer: BLUE CROSS/BLUE SHIELD | Attending: Hematology & Oncology

## 2015-07-21 ENCOUNTER — Ambulatory Visit: Payer: BLUE CROSS/BLUE SHIELD | Admitting: Gynecologic Oncology

## 2015-07-21 ENCOUNTER — Encounter (HOSPITAL_COMMUNITY): Payer: Self-pay | Admitting: General Surgery

## 2015-07-21 DIAGNOSIS — C801 Malignant (primary) neoplasm, unspecified: Secondary | ICD-10-CM | POA: Insufficient documentation

## 2015-07-21 NOTE — Progress Notes (Signed)
Chemo teaching done and consent signed for Oxaliplatin, Leucovorin, 5FU. Distress screening done. Meds called in.

## 2015-07-22 ENCOUNTER — Ambulatory Visit (HOSPITAL_COMMUNITY): Payer: BLUE CROSS/BLUE SHIELD

## 2015-07-22 ENCOUNTER — Encounter (HOSPITAL_BASED_OUTPATIENT_CLINIC_OR_DEPARTMENT_OTHER): Payer: BLUE CROSS/BLUE SHIELD

## 2015-07-22 VITALS — BP 129/50 | HR 80 | Temp 98.4°F | Resp 16 | Wt 127.0 lb

## 2015-07-22 DIAGNOSIS — Z5111 Encounter for antineoplastic chemotherapy: Secondary | ICD-10-CM

## 2015-07-22 DIAGNOSIS — C801 Malignant (primary) neoplasm, unspecified: Secondary | ICD-10-CM

## 2015-07-22 LAB — COMPREHENSIVE METABOLIC PANEL
ALK PHOS: 44 U/L (ref 38–126)
ALT: 23 U/L (ref 14–54)
ANION GAP: 9 (ref 5–15)
AST: 20 U/L (ref 15–41)
Albumin: 4.2 g/dL (ref 3.5–5.0)
BUN: 16 mg/dL (ref 6–20)
CALCIUM: 9.6 mg/dL (ref 8.9–10.3)
CO2: 27 mmol/L (ref 22–32)
CREATININE: 0.59 mg/dL (ref 0.44–1.00)
Chloride: 104 mmol/L (ref 101–111)
Glucose, Bld: 93 mg/dL (ref 65–99)
Potassium: 4.2 mmol/L (ref 3.5–5.1)
SODIUM: 140 mmol/L (ref 135–145)
Total Bilirubin: 0.5 mg/dL (ref 0.3–1.2)
Total Protein: 7.2 g/dL (ref 6.5–8.1)

## 2015-07-22 LAB — CBC WITH DIFFERENTIAL/PLATELET
Basophils Absolute: 0 10*3/uL (ref 0.0–0.1)
Basophils Relative: 1 %
EOS ABS: 0.4 10*3/uL (ref 0.0–0.7)
EOS PCT: 7 %
HCT: 36.6 % (ref 36.0–46.0)
Hemoglobin: 12.1 g/dL (ref 12.0–15.0)
LYMPHS ABS: 1.4 10*3/uL (ref 0.7–4.0)
LYMPHS PCT: 24 %
MCH: 31.8 pg (ref 26.0–34.0)
MCHC: 33.1 g/dL (ref 30.0–36.0)
MCV: 96.1 fL (ref 78.0–100.0)
MONOS PCT: 8 %
Monocytes Absolute: 0.5 10*3/uL (ref 0.1–1.0)
Neutro Abs: 3.5 10*3/uL (ref 1.7–7.7)
Neutrophils Relative %: 60 %
PLATELETS: 240 10*3/uL (ref 150–400)
RBC: 3.81 MIL/uL — AB (ref 3.87–5.11)
RDW: 12.5 % (ref 11.5–15.5)
WBC: 5.7 10*3/uL (ref 4.0–10.5)

## 2015-07-22 MED ORDER — DEXTROSE 5 % IV SOLN: 400.0000 mg/m2 | Freq: Once | INTRAVENOUS | Status: DC

## 2015-07-22 MED ORDER — FLUOROURACIL CHEMO INJECTION 2.5 GM/50ML
400.0000 mg/m2 | Freq: Once | INTRAVENOUS | Status: AC
  Administered 2015-07-22: 650 mg via INTRAVENOUS
  Filled 2015-07-22: qty 13

## 2015-07-22 MED ORDER — DEXTROSE 5 % IV SOLN
Freq: Once | INTRAVENOUS | Status: AC
  Administered 2015-07-22: 10:00:00 via INTRAVENOUS

## 2015-07-22 MED ORDER — SODIUM CHLORIDE 0.9 % IV SOLN
2400.0000 mg/m2 | INTRAVENOUS | Status: DC
  Administered 2015-07-22: 3950 mg via INTRAVENOUS
  Filled 2015-07-22: qty 79

## 2015-07-22 MED ORDER — SODIUM CHLORIDE 0.9 % IV SOLN
10.0000 mg | Freq: Once | INTRAVENOUS | Status: AC
  Administered 2015-07-22: 10 mg via INTRAVENOUS
  Filled 2015-07-22: qty 1

## 2015-07-22 MED ORDER — SODIUM CHLORIDE 0.9% FLUSH
10.0000 mL | INTRAVENOUS | Status: DC | PRN
  Administered 2015-07-22: 10 mL
  Filled 2015-07-22: qty 10

## 2015-07-22 MED ORDER — DEXTROSE 5 % IV SOLN
85.0000 mg/m2 | Freq: Once | INTRAVENOUS | Status: AC
  Administered 2015-07-22: 140 mg via INTRAVENOUS
  Filled 2015-07-22: qty 28

## 2015-07-22 MED ORDER — PALONOSETRON HCL INJECTION 0.25 MG/5ML
0.2500 mg | Freq: Once | INTRAVENOUS | Status: AC
  Administered 2015-07-22: 0.25 mg via INTRAVENOUS
  Filled 2015-07-22: qty 5

## 2015-07-22 MED ORDER — LEUCOVORIN CALCIUM INJECTION 100 MG
20.0000 mg/m2 | Freq: Once | INTRAMUSCULAR | Status: AC
  Administered 2015-07-22: 32 mg via INTRAVENOUS
  Filled 2015-07-22: qty 1.6

## 2015-07-22 NOTE — Patient Instructions (Signed)
[facility] Discharge Instructions for Patients Receiving Chemotherapy  Today you received the following chemotherapy agents Oxaliplatin, Leucovorin and 5FU pump Cycle 1 Day 1.  To help prevent nausea and vomiting after your treatment, we encourage you to take your nausea medication as instructed. If you develop nausea and vomiting that is not controlled by your nausea medication, call the clinic. If it is after clinic hours your family physician or the after hours number for the clinic or go to the Emergency Department. BELOW ARE SYMPTOMS THAT SHOULD BE REPORTED IMMEDIATELY:  *FEVER GREATER THAN 101.0 F  *CHILLS WITH OR WITHOUT FEVER  NAUSEA AND VOMITING THAT IS NOT CONTROLLED WITH YOUR NAUSEA MEDICATION  *UNUSUAL SHORTNESS OF BREATH  *UNUSUAL BRUISING OR BLEEDING  TENDERNESS IN MOUTH AND THROAT WITH OR WITHOUT PRESENCE OF ULCERS  *URINARY PROBLEMS  *BOWEL PROBLEMS  UNUSUAL RASH Items with * indicate a potential emergency and should be followed up as soon as possible.  Return as scheduled.  I have been informed and understand all the instructions given to me. I know to contact the clinic, my physician, or go to the Emergency Department if any problems should occur. I do not have any questions at this time, but understand that I may call the clinic during office hours or the Patient Navigator at [phone] should I have any questions or need assistance in obtaining follow up care.    __________________________________________  _____________  __________ Signature of Patient or Authorized Representative            Date                   Time    __________________________________________ Nurse's Signature

## 2015-07-22 NOTE — Progress Notes (Signed)
Tolerated chemo well. Continuous infusion pump intact. Ambulatory on discharge home with niece.

## 2015-07-24 ENCOUNTER — Encounter (HOSPITAL_COMMUNITY): Payer: Self-pay | Admitting: Genetic Counselor

## 2015-07-24 ENCOUNTER — Encounter (HOSPITAL_BASED_OUTPATIENT_CLINIC_OR_DEPARTMENT_OTHER): Payer: BLUE CROSS/BLUE SHIELD | Admitting: Genetic Counselor

## 2015-07-24 ENCOUNTER — Encounter (HOSPITAL_BASED_OUTPATIENT_CLINIC_OR_DEPARTMENT_OTHER): Payer: BLUE CROSS/BLUE SHIELD

## 2015-07-24 ENCOUNTER — Encounter (HOSPITAL_COMMUNITY): Payer: BLUE CROSS/BLUE SHIELD

## 2015-07-24 VITALS — BP 101/66 | HR 71 | Temp 98.0°F | Resp 16

## 2015-07-24 DIAGNOSIS — Z8041 Family history of malignant neoplasm of ovary: Secondary | ICD-10-CM | POA: Diagnosis not present

## 2015-07-24 DIAGNOSIS — Z800 Family history of malignant neoplasm of digestive organs: Secondary | ICD-10-CM | POA: Diagnosis not present

## 2015-07-24 DIAGNOSIS — C801 Malignant (primary) neoplasm, unspecified: Secondary | ICD-10-CM

## 2015-07-24 DIAGNOSIS — Z315 Encounter for genetic counseling: Secondary | ICD-10-CM | POA: Diagnosis not present

## 2015-07-24 MED ORDER — HEPARIN SOD (PORK) LOCK FLUSH 100 UNIT/ML IV SOLN
500.0000 [IU] | Freq: Once | INTRAVENOUS | Status: AC | PRN
  Administered 2015-07-24: 500 [IU]

## 2015-07-24 MED ORDER — PALONOSETRON HCL INJECTION 0.25 MG/5ML
0.2500 mg | Freq: Once | INTRAVENOUS | Status: AC
  Administered 2015-07-24: 0.25 mg via INTRAVENOUS

## 2015-07-24 MED ORDER — SODIUM CHLORIDE 0.9% FLUSH
10.0000 mL | INTRAVENOUS | Status: DC | PRN
  Administered 2015-07-24: 10 mL
  Filled 2015-07-24: qty 10

## 2015-07-24 MED ORDER — HEPARIN SOD (PORK) LOCK FLUSH 100 UNIT/ML IV SOLN
INTRAVENOUS | Status: AC
  Filled 2015-07-24: qty 5

## 2015-07-24 MED ORDER — PALONOSETRON HCL INJECTION 0.25 MG/5ML
INTRAVENOUS | Status: AC
  Filled 2015-07-24: qty 5

## 2015-07-24 NOTE — Progress Notes (Signed)
REFERRING PROVIDER: Monico Blitz, MD [address]  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 Family history of ovarian cancer   3. Family history of pancreatic cancer   4. Family history of colon cancer      HISTORY OF PRESENT ILLNESS:   Natalie Ball, a 65 y.o. female, was seen for a [place] cancer genetics consultation at the request of Vicente Masson due to a personal and family history of cancer.  Natalie Ball presents to clinic today to discuss the possibility of a hereditary predisposition to cancer, genetic testing, and to further clarify her future cancer risks, as well as potential cancer risks for family members.   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 This was treated with surgery and chemotherapy.     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 Prominent right gonadal vasculature. Tiny hypodensity in the dome of the liver is too small to characterize, but stable.   06/26/2015 Pathology Results Adnexa - ovary +/- tube, neoplastic, right - MUCINOUS ADENOCARCINOMA, 13 CM. - OVARIAN SURFACE NOT INVOLVED BY TUMOR. - UNREMARKABLE FALLOPIAN TUBE WITH NO ENDOMETRIOSIS OR MALIGNANCY.   06/26/2015 Procedure Exploratory laparotomy with total hysterectomy bilateral salpingo-oophorectomy, pelvic and para-aortic lymph node dissection, omentectomy washings and biopsies for ovarian cancer by Dr. [name]   06/27/2015 Pathology Results PERITONEAL WASHING(SPECIMEN 1 OF 1 COLLECTED 06/27/15): ATYPICAL CELLS. Immunohistochemistry calretinin, cytokeratin 5/6, estrogen receptor, progesterone receptor and WT-1 is performed. 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  First live birth at age 11.  OCP use for approximately 10+ years.  Ovaries intact: no.  Hysterectomy: yes.  Menopausal status: postmenopausal.  HRT use: 0 years. Colonoscopy: yes; normal. Mammogram within the last year: yes. Number of breast biopsies: 0. Up to date with pelvic exams:  yes. Any excessive radiation exposure in the past:  no  Past Medical History  Diagnosis Date  . Allergy   . GERD (gastroesophageal reflux disease)   . Complication of anesthesia   . PONV (postoperative nausea and vomiting)   . Wears glasses   . Tinnitus   . Vertigo   . Pneumonia   . History of bronchitis   . Urinary urgency   . Headache   . 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 Family history of ovarian cancer   . Family history of pancreatic cancer   . Family history of colon cancer     Past Surgical History  Procedure Laterality Date  . Tubal ligation  1974  . Diagnostic laparoscopy  1977  . Rectal fusula  1980  . Appendectomy    . Laparotomy N/A 06/26/2015    Procedure: EXPLORATORY LAPAROTOMY;  Surgeon: [name], MD;  Location: [facility];  Service: Gynecology;  Laterality: N/A;  . Abdominal hysterectomy N/A 06/26/2015    Procedure: TOTAL HYSTERECTOMY ABDOMINAL;  Surgeon: [name], MD;  Location: [facility];  Service: Gynecology;  Laterality: N/A;  . Salpingoophorectomy Bilateral 06/26/2015    Procedure: SALPINGO OOPHORECTOMY;  Surgeon: [name], MD;  Location: [facility];  Service: Gynecology;  Laterality: Bilateral;  . Omentectomy  N/A 06/26/2015    Procedure:  OMENTECTOMY;  Surgeon: [name], MD;  Location: [facility];  Service: Gynecology;  Laterality: N/A;  . Debulking N/A 06/26/2015    Procedure: bilateral periaortic lymph node dissection biopsies and peritoneal washings;  Surgeon: [name], MD;  Location: [facility];  Service: Gynecology;  Laterality: N/A;   . Portacath placement Left 07/18/2015    Procedure: INSERTION PORT-A-CATH;  Surgeon: Aviva Signs, MD;  Location: [facility];  Service: General;  Laterality: Left;  pt knows to arrive at 6:15    Social History   Social History  . Marital Status: Widowed    Spouse Name: N/A  . Number of Children: 1  . Years of Education: N/A   Social History Main Topics  . Smoking status: Former Smoker -- 1.00 packs/day for 19 years    Types: Cigarettes    Quit date: 06/14/1988  . Smokeless tobacco: Never Used  . Alcohol Use: Yes     Comment: occasionally   . Drug Use: No  . Sexual Activity: Not Asked   Other Topics Concern  . None   Social History Narrative     FAMILY HISTORY:  We obtained a detailed, 4-generation family history.  Significant diagnoses are listed below: Family History  Problem Relation Age of Onset  . Cancer Father   . Colon polyps Father   . Ovarian cancer Maternal Aunt   . Colon cancer Maternal Aunt   . Pancreatic cancer Maternal Uncle   . Colon polyps Mother   . Colon polyps Sister   . Cancer Paternal Aunt     NOS  . Heart defect Maternal Grandmother     hole in the heart  . COPD Maternal Grandfather   . Colon cancer Paternal Grandmother     dx possibly under 50  . Mental illness Sister   . Uterine cancer Maternal Aunt     dx possibly in her 56s  . Pancreatic cancer Maternal Aunt   . Cancer Paternal Aunt     NOS  . Cancer Cousin     adrenal cancer dx in his 85s; maternal cousin  . Ovarian cancer Cousin     dx in her 35s; maternal cousin  . Diabetes Daughter     The patient has one daughter, and three sisters, none of whom have cancer.  One sister may have had colon polyps.  Both parents have had colon polyps in the past.  Her father is 58 and has early dementia.  He had two bothers and three sisters, two of whom had an unknown form of cancer.  Her father's mother had colon cancer, possibly under 32.  The patient's mother is alive at 58.  She has two sisters  and two brothers.  One sister was diagnosed with possibly uterine cancer and pancreatic cancer, and the other sister was diagnosed with ovarian cancer and colon cancer.  One brother died of pancreatic cancer and had a son who died of adrenal cancer.  The other brother had one daughter die from ovarian cancer and another who reportedly had ovarian cancer, but indicates that the family prayed for her and it went away.  Patient's maternal ancestors are of [place] and [place] descent, and paternal ancestors are of [place] descent. There is no reported Ashkenazi Jewish ancestry. There is no known consanguinity.  GENETIC COUNSELING ASSESSMENT: Natalie Ball is a 65 y.o. female with a personal history of ovarian cancer and family history of ovarian, pancreatic, colon and possibly uterine  cancer which is somewhat suggestive of a Lynch syndrome and predisposition to cancer. We, therefore, discussed and recommended the following at today's visit.   DISCUSSION: We discussed that about 15-20% of women with ovarian cancer have a hereditary cause to their cancer.  Most commonly we see BRCA mutations associated with ovairan cancer, however, the cancer in Natalie Ball family is most consistent with Lynch syndrome.  We discussed that about 3% of colon cancer cases are the result of Lynch syndrome.  Lynch syndrome is associated with GI, GYN and GU cancers.  We reviewed the characteristics, features and inheritance patterns of hereditary cancer syndromes. We also discussed genetic testing, including the appropriate family members to test, the process of testing, insurance coverage and turn-around-time for results. We discussed the implications of a negative, positive and/or variant of uncertain significant result. We recommended Natalie Ball pursue genetic testing for the comprehensive cancer gene panel. The Comprehensive Cancer Panel offered by [facility] includes sequencing and/or deletion duplication testing of the following  32 genes: APC, ATM, AXIN2, BARD1, BMPR1A, BRCA1, BRCA2, BRIP1, CDH1, CDK4, CDKN2A, CHEK2, EPCAM, FANCC, MLH1, MSH2, MSH6, MUTYH, NBN, PALB2, PMS2, POLD1, POLE, PTEN, RAD51C, RAD51D, SCG5/GREM1, SMAD4, STK11, TP53, VHL, and XRCC2.     Based on Natalie Ball personal and family history of cancer, she meets medical criteria for genetic testing. Despite that she meets criteria, she may still have an out of pocket cost. We discussed that if her out of pocket cost for testing is over $100, the laboratory will call and confirm whether she wants to proceed with testing.  If the out of pocket cost of testing is less than $100 she will be billed by the genetic testing laboratory.   PLAN: After considering the risks, benefits, and limitations, Natalie Ball  provided informed consent to pursue genetic testing and the blood sample was sent to [facility] for analysis of the Comprehensive cancer panel. Results should be available within approximately 2-3 weeks' time, at which point they will be disclosed by telephone to Natalie Ball, as will any additional recommendations warranted by these results. Natalie Ball will receive a summary of her genetic counseling visit and a copy of her results once available. This information will also be available in Epic. We encouraged Natalie Ball to remain in contact with cancer genetics annually so that we can continuously update the family history and inform her of any changes in cancer genetics and testing that may be of benefit for her family. Natalie Ball questions were answered to her satisfaction today. Our contact information was provided should additional questions or concerns arise.  Lastly, we encouraged Ms. Zaccone to remain in contact with cancer genetics annually so that we can continuously update the family history and inform her of any changes in cancer genetics and testing that may be of benefit for this family.   Ms.  Nunziata questions were answered to her satisfaction  today. Our contact information was provided should additional questions or concerns arise. Thank you for the referral and allowing [place] to share in the care of your patient.   Karen P. Florene Glen, [place], [facility] Certified Genetic Counselor [email or web address] phone: [phone]  The patient was seen for a total of 60 minutes in face-to-face genetic counseling.  This patient was discussed with Drs. Magrinat, Lindi Adie and/or Burr Medico who agrees with the above.    _______________________________________________________________________ For Office Staff:  Number of people involved in session: 1 Was an Intern/ student involved with case: no

## 2015-07-24 NOTE — Progress Notes (Signed)
Patient reports mild nausea associated with chemo when awakening this morning. Reports she had an early appointment here with genetics counseling and she didn't bring nausea medicine with her and nausea has increased and she feels like she has a "bubble" in her stomach. Order for Aloxi received from MD. Continuous infusion pump stopped and disconnected from patient. Aloxi given as ordered. Flushed port per protocol and removed needle.

## 2015-07-24 NOTE — Patient Instructions (Signed)
[place] at [facility] Discharge Instructions  RECOMMENDATIONS MADE BY THE CONSULTANT AND ANY TEST RESULTS WILL BE SENT TO YOUR REFERRING PHYSICIAN.  Continuous infusion pump disconnected. Aloxi given IV push for nausea. Port flushed as ordered. Return as scheduled next week for follow up.  Thank you for choosing [place] at [facility] to provide your oncology and hematology care.  To afford each patient quality time with our provider, please arrive at least 15 minutes before your scheduled appointment time.   Beginning January 23rd 2017 lab work for the [facility] will be done in the  Main lab at [facility] on 1st floor. If you have a lab appointment with the [facility] please come in thru the  Main Entrance and check in at the main information desk  You need to re-schedule your appointment should you arrive 10 or more minutes late.  We strive to give you quality time with our providers, and arriving late affects you and other patients whose appointments are after yours.  Also, if you no show three or more times for appointments you may be dismissed from the clinic at the providers discretion.     Again, thank you for choosing [facility].  Our hope is that these requests will decrease the amount of time that you wait before being seen by our physicians.       _____________________________________________________________  Should you have questions after your visit to [facility], please contact our office at [phone] between the hours of 8:30 a.m. and 4:30 p.m.  Voicemails left after 4:30 p.m. will not be returned until the following business day.  For prescription refill requests, have your pharmacy contact our office.

## 2015-07-25 ENCOUNTER — Telehealth (HOSPITAL_COMMUNITY): Payer: Self-pay | Admitting: *Deleted

## 2015-07-25 NOTE — Telephone Encounter (Signed)
Patient has taken 2 tablets of Zofran today and 2 tablets of Compazine today. She states that she is eating and feeling hungry and that she is drinking fluids. She states that her stomach still feels a little uneasy. I instructed her to continue taking her medications around the clock for nausea until she didn't have that uneasy feeling. She said ok. I told her that she didn't need to wake up in the night to take these medications but to keep them in her system to get through the weekend. I asked her to call me Monday with a progress report and she said she would.

## 2015-07-25 NOTE — Telephone Encounter (Signed)
Patient states that she was so sick yday. Tx room nurse gave patient nausea medication through her port yday prior to pump being removed. Pt took Zofran when she got home and then a little later took the Compazine. She ate crackers and finally got better. Pt has sensitive skin and noticed redness where the tape was covering the skin. Pt to take a shower and wash her hair later today. I explained to her how to take the Zofran and Compazine again. Pt took a Zofran @ 0730 today. Pt has been eating and had a bowel movement this am. Pt had a loose stool this am. Pt to take her nausea medication today and call me this evening with a progress report.

## 2015-07-28 ENCOUNTER — Telehealth (HOSPITAL_COMMUNITY): Payer: Self-pay | Admitting: *Deleted

## 2015-07-28 NOTE — Telephone Encounter (Signed)
Patient states that she is doing much better and not having nausea like she was. Nausea on scale of 0-10 is a 1 per patient. She states that she is having loose stools. I instructed pt to buy some Imodium and take it as the diarrhea sheet states to and to call me if the loose stools don't slow down. She verbalized understanding of all instructions and has an appt with Tom tomorrow for f/u.

## 2015-07-29 ENCOUNTER — Encounter (HOSPITAL_COMMUNITY): Payer: Self-pay | Admitting: Oncology

## 2015-07-29 ENCOUNTER — Encounter (HOSPITAL_BASED_OUTPATIENT_CLINIC_OR_DEPARTMENT_OTHER): Payer: BLUE CROSS/BLUE SHIELD | Admitting: Oncology

## 2015-07-29 VITALS — BP 156/62 | HR 72 | Temp 98.0°F | Resp 18 | Wt 124.0 lb

## 2015-07-29 DIAGNOSIS — R110 Nausea: Secondary | ICD-10-CM

## 2015-07-29 DIAGNOSIS — R197 Diarrhea, unspecified: Secondary | ICD-10-CM | POA: Diagnosis not present

## 2015-07-29 DIAGNOSIS — C801 Malignant (primary) neoplasm, unspecified: Secondary | ICD-10-CM | POA: Diagnosis not present

## 2015-07-29 LAB — CBC WITH DIFFERENTIAL/PLATELET
BASOS ABS: 0 10*3/uL (ref 0.0–0.1)
Basophils Relative: 0 %
EOS PCT: 13 %
Eosinophils Absolute: 0.5 10*3/uL (ref 0.0–0.7)
HCT: 36.2 % (ref 36.0–46.0)
HEMOGLOBIN: 12.3 g/dL (ref 12.0–15.0)
LYMPHS ABS: 1.2 10*3/uL (ref 0.7–4.0)
LYMPHS PCT: 29 %
MCH: 32.1 pg (ref 26.0–34.0)
MCHC: 34 g/dL (ref 30.0–36.0)
MCV: 94.5 fL (ref 78.0–100.0)
Monocytes Absolute: 0.3 10*3/uL (ref 0.1–1.0)
Monocytes Relative: 7 %
NEUTROS PCT: 51 %
Neutro Abs: 2.1 10*3/uL (ref 1.7–7.7)
PLATELETS: 220 10*3/uL (ref 150–400)
RBC: 3.83 MIL/uL — AB (ref 3.87–5.11)
RDW: 11.9 % (ref 11.5–15.5)
WBC: 4 10*3/uL (ref 4.0–10.5)

## 2015-07-29 LAB — COMPREHENSIVE METABOLIC PANEL
ALT: 13 U/L — ABNORMAL LOW (ref 14–54)
ANION GAP: 9 (ref 5–15)
AST: 13 U/L — AB (ref 15–41)
Albumin: 4.6 g/dL (ref 3.5–5.0)
Alkaline Phosphatase: 48 U/L (ref 38–126)
BILIRUBIN TOTAL: 0.4 mg/dL (ref 0.3–1.2)
BUN: 17 mg/dL (ref 6–20)
CALCIUM: 9.5 mg/dL (ref 8.9–10.3)
CO2: 26 mmol/L (ref 22–32)
CREATININE: 0.63 mg/dL (ref 0.44–1.00)
Chloride: 103 mmol/L (ref 101–111)
GLUCOSE: 116 mg/dL — AB (ref 65–99)
POTASSIUM: 3.9 mmol/L (ref 3.5–5.1)
SODIUM: 138 mmol/L (ref 135–145)
Total Protein: 7.3 g/dL (ref 6.5–8.1)

## 2015-07-29 NOTE — Patient Instructions (Signed)
[place] at [facility] Discharge Instructions  RECOMMENDATIONS MADE BY THE CONSULTANT AND ANY TEST RESULTS WILL BE SENT TO YOUR REFERRING PHYSICIAN.  Exam and discussion today with Kirby Crigler, PA. We will discontinue your 5FU bolus starting with your next treatment. Office visit as scheduled next week.  Thank you for choosing [place] at [facility] to provide your oncology and hematology care.  To afford each patient quality time with our provider, please arrive at least 15 minutes before your scheduled appointment time.   Beginning January 23rd 2017 lab work for the [facility] will be done in the  Main lab at [facility] on 1st floor. If you have a lab appointment with the [facility] please come in thru the  Main Entrance and check in at the main information desk  You need to re-schedule your appointment should you arrive 10 or more minutes late.  We strive to give you quality time with our providers, and arriving late affects you and other patients whose appointments are after yours.  Also, if you no show three or more times for appointments you may be dismissed from the clinic at the providers discretion.     Again, thank you for choosing [facility].  Our hope is that these requests will decrease the amount of time that you wait before being seen by our physicians.       _____________________________________________________________  Should you have questions after your visit to [facility], please contact our office at [phone] between the hours of 8:30 a.m. and 4:30 p.m.  Voicemails left after 4:30 p.m. will not be returned until the following business day.  For prescription refill requests, have your pharmacy contact our office.

## 2015-07-29 NOTE — Progress Notes (Signed)
[facility] MD [place] [address]  Mucinous adenocarcinoma [facility]) - Plan: loperamide (IMODIUM) 2 MG capsule, CBC with Differential, Comprehensive metabolic panel, CBC with Differential, Comprehensive metabolic panel  CURRENT THERAPY: FOLFOX beginning on 07/22/2015  INTERVAL HISTORY: Natalie Ball 65 y.o. 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 [name] on 06/26/2015 after findings a large abdominopelvic mass by Gynecologist, Dr. Evie Lacks. It is dictated that the patient had a pre-operative elevation of CA 125 and CEA.  Began systemic chemotherapy in the adjuvant setting on 07/22/2015.    Mucinous adenocarcinoma ([place])   06/24/2015 Imaging CT CAP- Complex cystic central pelvic mass remains suspicious for cystic ovarian carcinoma, most likely arising from the right ovary. Prominent right gonadal vasculature. Tiny hypodensity in the dome of the liver is too small to characterize, but stable.   06/26/2015 Pathology Results Adnexa - ovary +/- tube, neoplastic, right - MUCINOUS ADENOCARCINOMA, 13 CM. - OVARIAN SURFACE NOT INVOLVED BY TUMOR. - UNREMARKABLE FALLOPIAN TUBE WITH NO ENDOMETRIOSIS OR MALIGNANCY.   06/26/2015 Procedure Exploratory laparotomy with total hysterectomy bilateral salpingo-oophorectomy, pelvic and para-aortic lymph node dissection, omentectomy washings and biopsies for ovarian cancer by Dr. [name]   06/27/2015 Pathology Results PERITONEAL WASHING(SPECIMEN 1 OF 1 COLLECTED 06/27/15): ATYPICAL CELLS. Immunohistochemistry calretinin, cytokeratin 5/6, estrogen receptor, progesterone receptor and WT-1 is performed. The morphology and immunophenotype favor reactive mesothelial cells   07/10/2015 Initial Diagnosis Mucinous adenocarcinoma ([place])   07/22/2015 -  Chemotherapy FOLFOX   07/24/2015 Survivorship Genetic Counseling consultation  with [name].   07/26/2015 Adverse Reaction Loose stools, 2-3 per 24 hours   07/29/2015 Treatment Plan Change 5FU bolus discontinued due to loose stools.    Patient presents today for a NADIR CHECK.  I personally reviewed and went over laboratory results with the patient.  The results are noted within this dictation.  She notes minimal nausea that was well controlled with Compazine.  She denies any emesis.  She reports that her nausea lasted 3 days post treatment and by Sunday, she was much improved.  She admits to 2-3 loose stools/24 hours.  She reports that this is slowly improving and Imodium has been effective for this.  She notes that prior to her cancer diagnosis, she had 1 formed stool per day.  She otherwise reports that she feels good.  She denies any PN symptoms and issues with cold intolerance.  Past Medical History  Diagnosis Date  . Allergy   . GERD (gastroesophageal reflux disease)   . Complication of anesthesia   . PONV (postoperative nausea and vomiting)   . Wears glasses   . Tinnitus   . Vertigo   . Pneumonia   . History of bronchitis   . Urinary urgency   . Headache   . Mucinous adenocarcinoma ([place]) 07/10/2015  . Family history of ovarian cancer   . Family history of pancreatic cancer   . Family history of colon cancer     has Mucinous adenocarcinoma ([place]); Family history of ovarian cancer; Family history of pancreatic cancer; and Family history of colon cancer on her problem list.     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  Current Outpatient Prescriptions on File Prior to Visit  Medication Sig Dispense Refill  . dextrose 5 % SOLN 1,000 mL with fluorouracil 5 GM/100ML SOLN Inject into the vein. To infuse over 46 hours every 14 days    .  famotidine (PEPCID) 20 MG tablet Take 20 mg by mouth every morning.    . fluticasone (FLONASE) 50 MCG/ACT nasal spray Place 1 spray into both nostrils daily.    Marland Kitchen HYDROcodone-acetaminophen (NORCO/VICODIN) 5-325 MG tablet Take 1 tablet by  mouth every 4 (four) hours as needed for moderate pain. 30 tablet 0  . ibuprofen (ADVIL,MOTRIN) 200 MG tablet Take 400 mg by mouth every 6 (six) hours as needed for headache or moderate pain. Reported on 07/14/2015    . leucovorin in dextrose 5 % 250 mL Inject into the vein once. Every 14 days    . lidocaine-prilocaine (EMLA) cream Apply a quarter size amount to port site 1 hour prior to chemo. Do not rub in. Cover with plastic wrap. 30 g 3  . loratadine (CLARITIN) 10 MG tablet Take 10 mg by mouth daily as needed for allergies.    Marland Kitchen ondansetron (ZOFRAN) 8 MG tablet Take 1 tablet (8 mg total) by mouth every 8 (eight) hours as needed for nausea or vomiting. 30 tablet 3  . OXALIPLATIN IV Inject into the vein. Every 14 days    . Papaya Enzyme CHEW Chew 1 tablet by mouth daily. For digestive health    . prochlorperazine (COMPAZINE) 10 MG tablet Take 1 tablet (10 mg total) by mouth every 6 (six) hours as needed for nausea or vomiting. 30 tablet 3  . bisacodyl (DULCOLAX) 10 MG suppository Place 10 mg rectally as needed for moderate constipation. Reported on 07/29/2015    . docusate sodium (COLACE) 100 MG capsule Take 200 mg by mouth 2 (two) times daily. Reported on 07/29/2015     No current facility-administered medications on file prior to visit.    Past Surgical History  Procedure Laterality Date  . Tubal ligation  1974  . Diagnostic laparoscopy  1977  . Rectal fusula  1980  . Appendectomy    . Laparotomy N/A 06/26/2015    Procedure: EXPLORATORY LAPAROTOMY;  Surgeon: [name], MD;  Location: [facility];  Service: Gynecology;  Laterality: N/A;  . Abdominal hysterectomy N/A 06/26/2015    Procedure: TOTAL HYSTERECTOMY ABDOMINAL;  Surgeon: [name], MD;  Location: [facility];  Service: Gynecology;  Laterality: N/A;  . Salpingoophorectomy Bilateral 06/26/2015    Procedure: SALPINGO OOPHORECTOMY;  Surgeon: [name], MD;  Location: [facility];  Service: Gynecology;  Laterality: Bilateral;  .  Omentectomy N/A 06/26/2015    Procedure:  OMENTECTOMY;  Surgeon: [name], MD;  Location: [facility];  Service: Gynecology;  Laterality: N/A;  . Debulking N/A 06/26/2015    Procedure: bilateral periaortic lymph node dissection biopsies and peritoneal washings;  Surgeon: [name], MD;  Location: [facility];  Service: Gynecology;  Laterality: N/A;  . Portacath placement Left 07/18/2015    Procedure: INSERTION PORT-A-CATH;  Surgeon: Aviva Signs, MD;  Location: [facility];  Service: General;  Laterality: Left;  pt knows to arrive at 6:15    Denies any headaches, dizziness, double vision, fevers, chills, night sweats,  vomiting, constipation, chest pain, heart palpitations, shortness of breath, blood in stool, black tarry stool, urinary pain, urinary burning, urinary frequency, hematuria.   PHYSICAL EXAMINATION  ECOG PERFORMANCE STATUS: 0 - Asymptomatic  Filed Vitals:   07/29/15 1030  BP: 156/62  Pulse: 72  Temp: 98 F (36.7 C)  Resp: 18    GENERAL:alert, well nourished, well developed, comfortable, cooperative, smiling and unaccompanied SKIN: skin color, texture, turgor are normal, no rashes or significant lesions HEAD: Normocephalic, No masses, lesions, tenderness or abnormalities EYES:  normal, EOMI, Conjunctiva are pink and non-injected EARS: External ears normal OROPHARYNX:lips, buccal mucosa, and tongue normal and mucous membranes are moist  NECK: supple, trachea midline LYMPH:  not examined BREAST:not examined LUNGS: clear to auscultation  HEART: regular rate & rhythm ABDOMEN:abdomen soft and normal bowel sounds BACK: Back symmetric, no curvature. EXTREMITIES:less then 2 second capillary refill, no joint deformities, effusion, or inflammation, no skin discoloration, no cyanosis  NEURO: alert & oriented x 3 with fluent speech, no focal motor/sensory deficits, gait normal   LABORATORY DATA: CBC    Component Value Date/Time   WBC 4.0 07/29/2015 1146   RBC 3.83* 07/29/2015 1146    HGB 12.3 07/29/2015 1146   HCT 36.2 07/29/2015 1146   PLT 220 07/29/2015 1146   MCV 94.5 07/29/2015 1146   MCH 32.1 07/29/2015 1146   MCHC 34.0 07/29/2015 1146   RDW 11.9 07/29/2015 1146   LYMPHSABS 1.2 07/29/2015 1146   MONOABS 0.3 07/29/2015 1146   EOSABS 0.5 07/29/2015 1146   BASOSABS 0.0 07/29/2015 1146      Chemistry      Component Value Date/Time   NA 138 07/29/2015 1146   NA 141 06/20/2015 1121   K 3.9 07/29/2015 1146   K 4.1 06/20/2015 1121   CL 103 07/29/2015 1146   CO2 26 07/29/2015 1146   CO2 24 06/20/2015 1121   BUN 17 07/29/2015 1146   BUN 14.7 06/20/2015 1121   CREATININE 0.63 07/29/2015 1146   CREATININE 0.8 06/20/2015 1121      Component Value Date/Time   CALCIUM 9.5 07/29/2015 1146   CALCIUM 9.2 06/20/2015 1121   ALKPHOS 48 07/29/2015 1146   AST 13* 07/29/2015 1146   ALT 13* 07/29/2015 1146   BILITOT 0.4 07/29/2015 1146        PENDING LABS:   RADIOGRAPHIC STUDIES:  Dg Chest 2 View  07/18/2015  CLINICAL DATA:  Status post port placement EXAM: CHEST  2 VIEW COMPARISON:  07/18/2015 FINDINGS: Cardiac shadow is stable. A left chest wall port is again seen. The lungs are well aerated bilaterally. Tiny apical pneumothorax is again seen on the left and stable. No new acute abnormality is noted. IMPRESSION: Stable tiny left pneumothorax Electronically Signed   By: Inez Catalina M.D.   On: 07/18/2015 09:18   Dg Chest Port 1 View  07/18/2015  CLINICAL DATA:  65 year old female status post Port-A-Cath insertion. History of mucinous at adenocarcinoma. EXAM: PORTABLE CHEST 1 VIEW COMPARISON:  Chest x-ray 05/07/2015. FINDINGS: Left-sided single-lumen subclavian power porta cath with tip terminating in the mid superior vena cava. Trace left apical pneumothorax (less than 5% of the volume of the left hemithorax) is noted. Lung volumes are otherwise normal. No consolidative airspace disease. No pleural effusions. No evidence of pulmonary edema. No suspicious appearing  pulmonary nodules or masses are identified by plain film examination. Heart size is normal. Upper mediastinal contours are within normal limits. IMPRESSION: 1. Interval placement of left-sided subclavian single-lumen power porta cath with tip terminating in the mid superior vena cava. Trace left-sided postprocedural pneumothorax (less than 5% of the volume of the left hemithorax) noted. Close attention on followup studies is recommended to ensure stability or resolution of this finding. Critical Value/emergent results were called by telephone at the time of interpretation on 07/18/2015 at 8:59 am to Dr. Aviva Signs, who verbally acknowledged these results. Electronically Signed   By: Vinnie Langton M.D.   On: 07/18/2015 08:59   Dg C-arm 1-60 Min-no Report  07/18/2015  CLINICAL DATA: Portacath insertion C-ARM 1-60 MINUTES Fluoroscopy was utilized by the requesting physician.  No radiographic interpretation.     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 [name] on 06/26/2015 after findings a large abdominopelvic mass by Gynecologist, Dr. Evie Lacks. It is dictated that the patient had a pre-operative elevation of CA 125 and CEA.  Began systemic chemotherapy in the adjuvant setting on 07/22/2015.  Oncology history is updated.  Labs today: CBC diff, CMET  She notes some mild nausea without vomiting.  She reports that home anti-emetics were effective for her.  Her nausea is resolved.  She notes loose stools up to 3/day.  She is managing this with Imodium.  Given this, I will D/C her 5FU bolus.  Her treatment plan is altered accordingly along with her oncology history.  Return next week for pre-treatment labs and follow-up appointment with plan to pursue cycle #2 of adjuvant treatment.   THERAPY PLAN:  Continue with treatment as planned  in an adjuvant setting.  I have discontinued her 5 FU bolus given her bowel changes that are likely secondary to 5FU.  All questions were answered. The patient knows to call the clinic with any problems, questions or concerns. We can certainly see the patient much sooner if necessary.  Patient and plan discussed with Dr. Ancil Linsey and she is in agreement with the aforementioned.   This note is electronically signed by: Doy Mince 07/29/2015 2:09 PM

## 2015-07-29 NOTE — Progress Notes (Signed)
..  Hulan Amato Schachter's reason for visit today are for labs as scheduled per MD orders.  Venipuncture performed with a 23 gauge butterfly needle to L Antecubital.  Horton Marshall tolerated venipuncture well and without incident; questions were answered and patient was discharged.

## 2015-07-29 NOTE — Assessment & Plan Note (Addendum)
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 [name] on 06/26/2015 after findings a large abdominopelvic mass by Gynecologist, Dr. Evie Lacks. It is dictated that the patient had a pre-operative elevation of CA 125 and CEA.  Began systemic chemotherapy in the adjuvant setting on 07/22/2015.  Oncology history is updated.  Labs today: CBC diff, CMET  She notes some mild nausea without vomiting.  She reports that home anti-emetics were effective for her.  Her nausea is resolved.  She notes loose stools up to 3/day.  She is managing this with Imodium.  Given this, I will D/C her 5FU bolus.  Her treatment plan is altered accordingly along with her oncology history.  Return next week for pre-treatment labs and follow-up appointment with plan to pursue cycle #2 of adjuvant treatment.

## 2015-07-31 ENCOUNTER — Other Ambulatory Visit (HOSPITAL_COMMUNITY): Payer: Self-pay | Admitting: *Deleted

## 2015-07-31 DIAGNOSIS — C801 Malignant (primary) neoplasm, unspecified: Secondary | ICD-10-CM

## 2015-07-31 MED ORDER — MAGIC MOUTHWASH W/LIDOCAINE: ORAL | Status: DC

## 2015-08-04 NOTE — Progress Notes (Addendum)
[facility], MD [place] [address]  Mucinous adenocarcinoma [facility])  CURRENT THERAPY: Adjuvant FOLFOX beginning on 07/22/2015.  INTERVAL HISTORY: Natalie Ball 65 y.o. 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 [name] on 06/26/2015 after findings a large abdominopelvic mass by Gynecologist, Dr. Evie Lacks. It is dictated that the patient had a pre-operative elevation of CA 125 and CEA. Began systemic chemotherapy in the adjuvant setting on 07/22/2015.    Mucinous adenocarcinoma ([place])   06/24/2015 Imaging CT CAP- Complex cystic central pelvic mass remains suspicious for cystic ovarian carcinoma, most likely arising from the right ovary. Prominent right gonadal vasculature. Tiny hypodensity in the dome of the liver is too small to characterize, but stable.   06/26/2015 Pathology Results Adnexa - ovary +/- tube, neoplastic, right - MUCINOUS ADENOCARCINOMA, 13 CM. - OVARIAN SURFACE NOT INVOLVED BY TUMOR. - UNREMARKABLE FALLOPIAN TUBE WITH NO ENDOMETRIOSIS OR MALIGNANCY.   06/26/2015 Procedure Exploratory laparotomy with total hysterectomy bilateral salpingo-oophorectomy, pelvic and para-aortic lymph node dissection, omentectomy washings and biopsies for ovarian cancer by Dr. [name]   06/27/2015 Pathology Results PERITONEAL WASHING(SPECIMEN 1 OF 1 COLLECTED 06/27/15): ATYPICAL CELLS. Immunohistochemistry calretinin, cytokeratin 5/6, estrogen receptor, progesterone receptor and WT-1 is performed. The morphology and immunophenotype favor reactive mesothelial cells   07/10/2015 Initial Diagnosis Mucinous adenocarcinoma ([place])   07/22/2015 -  Chemotherapy FOLFOX   07/24/2015 Survivorship Genetic Counseling consultation with [name].   07/26/2015 Adverse Reaction Loose stools, 2-3 per 24 hours   07/29/2015 Treatment Plan Change 5FU bolus discontinued due  to loose stools.     I personally reviewed and went over laboratory results with the patient.  The results are noted within this dictation.  Labs will be updated today.  She is educated on the changes in her treatment plan following her nadir follow-up appointment last week. Her case was discussed with Dr. Whitney Muse and we decided to delete her 5-FU bolus for her second cycle of adjuvant chemotherapy.  At the patient's nadir check appointment, she noted some nausea with some loose stools. This is documented on previous encounter. She notes that her nausea is nonexistent at this time. She reports that a resolve shortly after seeing me. Her stools are back to baseline and she denies any stools.  She asks if she can have a single alcoholic beverage per week.  She enjoys Nurse, learning disability.  She has one weekly at a [employer].  She is free to have an alcoholic beverage per week if she desires.   Past Medical History  Diagnosis Date  . Allergy   . GERD (gastroesophageal reflux disease)   . Complication of anesthesia   . PONV (postoperative nausea and vomiting)   . Wears glasses   . Tinnitus   . Vertigo   . Pneumonia   . History of bronchitis   . Urinary urgency   . Headache   . Mucinous adenocarcinoma ([place]) 07/10/2015  . Family history of ovarian cancer   . Family history of pancreatic cancer   . Family history of colon cancer     has Mucinous adenocarcinoma ([place]); Family history of ovarian cancer; Family history of pancreatic cancer; and Family history of colon cancer on her problem list.     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  Current Outpatient Prescriptions on File Prior to Visit  Medication Sig Dispense Refill  . bisacodyl (DULCOLAX)  10 MG suppository Place 10 mg rectally as needed for moderate constipation. Reported on 07/29/2015    . dextrose 5 % SOLN 1,000 mL with fluorouracil 5 GM/100ML SOLN Inject into the vein. To infuse over 46 hours every 14 days    . docusate sodium (COLACE)  100 MG capsule Take 200 mg by mouth 2 (two) times daily. Reported on 07/29/2015    . famotidine (PEPCID) 20 MG tablet Take 20 mg by mouth every morning.    . fluticasone (FLONASE) 50 MCG/ACT nasal spray Place 1 spray into both nostrils daily.    Marland Kitchen HYDROcodone-acetaminophen (NORCO/VICODIN) 5-325 MG tablet Take 1 tablet by mouth every 4 (four) hours as needed for moderate pain. 30 tablet 0  . ibuprofen (ADVIL,MOTRIN) 200 MG tablet Take 400 mg by mouth every 6 (six) hours as needed for headache or moderate pain. Reported on 07/14/2015    . leucovorin in dextrose 5 % 250 mL Inject into the vein once. Every 14 days    . loperamide (IMODIUM) 2 MG capsule Take 2 mg by mouth as needed for diarrhea or loose stools.    Marland Kitchen loratadine (CLARITIN) 10 MG tablet Take 10 mg by mouth daily as needed for allergies.    . magic mouthwash w/lidocaine SOLN Swish and swallow 1 teaspoonful four times a day. 360 mL 1  . ondansetron (ZOFRAN) 8 MG tablet Take 1 tablet (8 mg total) by mouth every 8 (eight) hours as needed for nausea or vomiting. 30 tablet 3  . OXALIPLATIN IV Inject into the vein. Every 14 days    . Papaya Enzyme CHEW Chew 1 tablet by mouth daily. For digestive health    . prochlorperazine (COMPAZINE) 10 MG tablet Take 1 tablet (10 mg total) by mouth every 6 (six) hours as needed for nausea or vomiting. 30 tablet 3  . lidocaine-prilocaine (EMLA) cream Apply a quarter size amount to port site 1 hour prior to chemo. Do not rub in. Cover with plastic wrap. (Patient not taking: Reported on 08/05/2015) 30 g 3   Current Facility-Administered Medications on File Prior to Visit  Medication Dose Route Frequency Provider Last Rate Last Dose  . 0.9 %  sodium chloride infusion   Intravenous Continuous Baird Cancer, PA-C   Stopped at 08/05/15 1619  . fluorouracil (ADRUCIL) 3,950 mg in sodium chloride 0.9 % 71 mL chemo infusion  2,400 mg/m2 (Treatment Plan Actual) Intravenous 1 day or 1 dose Patrici Ranks, MD   3,950 mg  at 08/05/15 1619  . sodium chloride flush (NS) 0.9 % injection 10 mL  10 mL Intracatheter PRN Patrici Ranks, MD        Past Surgical History  Procedure Laterality Date  . Tubal ligation  1974  . Diagnostic laparoscopy  1977  . Rectal fusula  1980  . Appendectomy    . Laparotomy N/A 06/26/2015    Procedure: EXPLORATORY LAPAROTOMY;  Surgeon: [name], MD;  Location: [facility];  Service: Gynecology;  Laterality: N/A;  . Abdominal hysterectomy N/A 06/26/2015    Procedure: TOTAL HYSTERECTOMY ABDOMINAL;  Surgeon: [name], MD;  Location: [facility];  Service: Gynecology;  Laterality: N/A;  . Salpingoophorectomy Bilateral 06/26/2015    Procedure: SALPINGO OOPHORECTOMY;  Surgeon: [name], MD;  Location: [facility];  Service: Gynecology;  Laterality: Bilateral;  . Omentectomy N/A 06/26/2015    Procedure:  OMENTECTOMY;  Surgeon: [name], MD;  Location: [facility];  Service: Gynecology;  Laterality: N/A;  . Debulking N/A 06/26/2015  Procedure: bilateral periaortic lymph node dissection biopsies and peritoneal washings;  Surgeon: [name], MD;  Location: [facility];  Service: Gynecology;  Laterality: N/A;  . Portacath placement Left 07/18/2015    Procedure: INSERTION PORT-A-CATH;  Surgeon: Aviva Signs, MD;  Location: [facility];  Service: General;  Laterality: Left;  pt knows to arrive at 6:15    Denies any headaches, dizziness, double vision, fevers, chills, night sweats, nausea, vomiting, diarrhea, constipation, chest pain, heart palpitations, shortness of breath, blood in stool, black tarry stool, urinary pain, urinary burning, urinary frequency, hematuria.   PHYSICAL EXAMINATION  ECOG PERFORMANCE STATUS: 0 - Asymptomatic  Filed Vitals:   08/05/15 0907  BP: 150/77  Pulse: 63  Temp: 97.8 F (36.6 C)  Resp: 18    GENERAL:alert, no distress, well nourished, well developed, comfortable, cooperative, smiling and unaccompanied SKIN: skin color, texture, turgor are normal, no rashes  or significant lesions HEAD: Normocephalic, No masses, lesions, tenderness or abnormalities EYES: normal, EOMI, Conjunctiva are pink and non-injected EARS: External ears normal OROPHARYNX:no exudate, no erythema, lips, buccal mucosa, and tongue normal and mucous membranes are moist  NECK: supple, no adenopathy, trachea midline LYMPH:  no palpable lymphadenopathy BREAST:not examined LUNGS: clear to auscultation and percussion HEART: regular rate & rhythm, no murmurs, no gallops, S1 normal and S2 normal ABDOMEN:abdomen soft, non-tender, normal bowel sounds and no masses or organomegaly BACK: Back symmetric, no curvature., No CVA tenderness EXTREMITIES:less then 2 second capillary refill, no joint deformities, effusion, or inflammation, no edema, no skin discoloration, no clubbing, no cyanosis  NEURO: alert & oriented x 3 with fluent speech, no focal motor/sensory deficits, gait normal   LABORATORY DATA: CBC    Component Value Date/Time   WBC 4.4 08/05/2015 0915   RBC 3.72* 08/05/2015 0915   HGB 11.8* 08/05/2015 0915   HCT 35.4* 08/05/2015 0915   PLT 249 08/05/2015 0915   MCV 95.2 08/05/2015 0915   MCH 31.7 08/05/2015 0915   MCHC 33.3 08/05/2015 0915   RDW 12.6 08/05/2015 0915   LYMPHSABS 1.2 08/05/2015 0915   MONOABS 0.6 08/05/2015 0915   EOSABS 0.2 08/05/2015 0915   BASOSABS 0.1 08/05/2015 0915      Chemistry      Component Value Date/Time   NA 142 08/05/2015 0915   NA 141 06/20/2015 1121   K 4.0 08/05/2015 0915   K 4.1 06/20/2015 1121   CL 107 08/05/2015 0915   CO2 27 08/05/2015 0915   CO2 24 06/20/2015 1121   BUN 19 08/05/2015 0915   BUN 14.7 06/20/2015 1121   CREATININE 0.61 08/05/2015 0915   CREATININE 0.8 06/20/2015 1121      Component Value Date/Time   CALCIUM 9.0 08/05/2015 0915   CALCIUM 9.2 06/20/2015 1121   ALKPHOS 41 08/05/2015 0915   AST 14* 08/05/2015 0915   ALT 14 08/05/2015 0915   BILITOT 0.4 08/05/2015 0915        PENDING  LABS:   RADIOGRAPHIC STUDIES:  Dg Chest 2 View  07/18/2015  CLINICAL DATA:  Status post port placement EXAM: CHEST  2 VIEW COMPARISON:  07/18/2015 FINDINGS: Cardiac shadow is stable. A left chest wall port is again seen. The lungs are well aerated bilaterally. Tiny apical pneumothorax is again seen on the left and stable. No new acute abnormality is noted. IMPRESSION: Stable tiny left pneumothorax Electronically Signed   By: Inez Catalina M.D.   On: 07/18/2015 09:18   Dg Chest Port 1 View  07/18/2015  CLINICAL DATA:  65 year old female status post Port-A-Cath insertion. History of mucinous at adenocarcinoma. EXAM: PORTABLE CHEST 1 VIEW COMPARISON:  Chest x-ray 05/07/2015. FINDINGS: Left-sided single-lumen subclavian power porta cath with tip terminating in the mid superior vena cava. Trace left apical pneumothorax (less than 5% of the volume of the left hemithorax) is noted. Lung volumes are otherwise normal. No consolidative airspace disease. No pleural effusions. No evidence of pulmonary edema. No suspicious appearing pulmonary nodules or masses are identified by plain film examination. Heart size is normal. Upper mediastinal contours are within normal limits. IMPRESSION: 1. Interval placement of left-sided subclavian single-lumen power porta cath with tip terminating in the mid superior vena cava. Trace left-sided postprocedural pneumothorax (less than 5% of the volume of the left hemithorax) noted. Close attention on followup studies is recommended to ensure stability or resolution of this finding. Critical Value/emergent results were called by telephone at the time of interpretation on 07/18/2015 at 8:59 am to Dr. Aviva Signs, who verbally acknowledged these results. Electronically Signed   By: Vinnie Langton M.D.   On: 07/18/2015 08:59   Dg C-arm 1-60 Min-no Report  07/18/2015  CLINICAL DATA: Portacath insertion C-ARM 1-60 MINUTES Fluoroscopy was utilized by the requesting physician.  No radiographic  interpretation.     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 [name] on 06/26/2015 after findings a large abdominopelvic mass by Gynecologist, Dr. Evie Lacks. It is dictated that the patient had a pre-operative elevation of CA 125 and CEA.  Began systemic chemotherapy in the adjuvant setting on 07/22/2015 consisting of FOLFOX.  Oncology history is updated.  Labs today: CBC diff, CMET  Last week, at her nadir check, she noted loose stools up to 3/day.  Given this, her 5-FU bolus was discontinued.  She is educated on this treatment plan change.  Additionally, she noted some nausea.  Therefore, Emend was added to her pre-treatment antiemetic regimen today and moving forward.  Return in 2 weeks for follow-up, pretreatment labs, and follow-up appointment.  Addendum: Dr. Whitney Muse was urgently called into the patient's chemotherapy room in the midst of seeing outpatient patients.  She saw the patient short of breath and her Oxaliplatin had just completed.  Ausculation of lung demonstrated wheezing throughout.  Rash was noted on cervical neck.   On further exam, rash was noted on chest and upper abdomen.  Vitals were ascertained and she was noted to be hypertensive in the 190/110 range.  Anaphylactic protocol was followed.  She was given Benadryl, Pepcid, and solu-medrol.  Respiratory was called for back-up while nursing was getting nebulizer prepared with albuterol.  Her clinic situation quickly declined and repeat auscultation demonstrated no breath sounds.  She became more distressed as well.  RAPID RESPONSE was called.  Albuterol nebulizer was administered.  She recovered quickly.  Her rash improved and resolved.  She remained in the clinic for an additional 45-60 minutes to confirm stability.  Her 5FU  pump was set-up and she went home as planned with 48 hour infusion of 5FU.  Future Oxaliplatin will need to be adjusted according to the desensitization protocol.    THERAPY PLAN:  Continue with treatment as planned.  All questions were answered. The patient knows to call the clinic with any problems, questions or concerns. We can certainly see the patient much sooner if necessary.  Patient and plan discussed with  Dr. Ancil Linsey and she is in agreement with the aforementioned.   This note is electronically signed by: Doy Mince 08/05/2015 5:43 PM

## 2015-08-04 NOTE — Assessment & Plan Note (Addendum)
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 [name] on 06/26/2015 after findings a large abdominopelvic mass by Gynecologist, Dr. Evie Lacks. It is dictated that the patient had a pre-operative elevation of CA 125 and CEA.  Began systemic chemotherapy in the adjuvant setting on 07/22/2015 consisting of FOLFOX.  Oncology history is updated.  Labs today: CBC diff, CMET  Last week, at her nadir check, she noted loose stools up to 3/day.  Given this, her 5-FU bolus was discontinued.  She is educated on this treatment plan change.  Additionally, she noted some nausea.  Therefore, Emend was added to her pre-treatment antiemetic regimen today and moving forward.  Return in 2 weeks for follow-up, pretreatment labs, and follow-up appointment.  Addendum: Dr. Whitney Muse was urgently called into the patient's chemotherapy room in the midst of seeing outpatient patients.  She saw the patient short of breath and her Oxaliplatin had just completed.  Ausculation of lung demonstrated wheezing throughout.  Rash was noted on cervical neck.   On further exam, rash was noted on chest and upper abdomen.  Vitals were ascertained and she was noted to be hypertensive in the 190/110 range.  Anaphylactic protocol was followed.  She was given Benadryl, Pepcid, and solu-medrol.  Respiratory was called for back-up while nursing was getting nebulizer prepared with albuterol.  Her clinic situation quickly declined and repeat auscultation demonstrated no breath sounds.  She became more distressed as well.  RAPID RESPONSE was called.  Albuterol nebulizer was administered.  She recovered quickly.  Her rash improved and resolved.  She remained in the clinic for an additional 45-60 minutes to confirm stability.  Her 5FU pump was set-up and she went home as planned with 48 hour infusion of 5FU.  Future Oxaliplatin  will need to be adjusted according to the desensitization protocol.

## 2015-08-05 ENCOUNTER — Encounter (HOSPITAL_COMMUNITY): Payer: Self-pay | Admitting: Oncology

## 2015-08-05 ENCOUNTER — Encounter (HOSPITAL_BASED_OUTPATIENT_CLINIC_OR_DEPARTMENT_OTHER): Payer: BLUE CROSS/BLUE SHIELD | Admitting: Oncology

## 2015-08-05 ENCOUNTER — Encounter (HOSPITAL_BASED_OUTPATIENT_CLINIC_OR_DEPARTMENT_OTHER): Payer: BLUE CROSS/BLUE SHIELD

## 2015-08-05 ENCOUNTER — Ambulatory Visit (HOSPITAL_COMMUNITY): Payer: BLUE CROSS/BLUE SHIELD | Admitting: Hematology & Oncology

## 2015-08-05 VITALS — BP 129/67 | HR 71 | Temp 98.0°F | Resp 18

## 2015-08-05 VITALS — BP 150/77 | HR 63 | Temp 97.8°F | Resp 18 | Wt 130.0 lb

## 2015-08-05 DIAGNOSIS — Z5111 Encounter for antineoplastic chemotherapy: Secondary | ICD-10-CM | POA: Diagnosis not present

## 2015-08-05 DIAGNOSIS — C801 Malignant (primary) neoplasm, unspecified: Secondary | ICD-10-CM

## 2015-08-05 LAB — COMPREHENSIVE METABOLIC PANEL
ALK PHOS: 41 U/L (ref 38–126)
ALT: 14 U/L (ref 14–54)
AST: 14 U/L — ABNORMAL LOW (ref 15–41)
Albumin: 3.9 g/dL (ref 3.5–5.0)
Anion gap: 8 (ref 5–15)
BILIRUBIN TOTAL: 0.4 mg/dL (ref 0.3–1.2)
BUN: 19 mg/dL (ref 6–20)
CALCIUM: 9 mg/dL (ref 8.9–10.3)
CO2: 27 mmol/L (ref 22–32)
Chloride: 107 mmol/L (ref 101–111)
Creatinine, Ser: 0.61 mg/dL (ref 0.44–1.00)
GFR calc non Af Amer: >60 mL/min (ref >60)
Glucose, Bld: 94 mg/dL (ref 65–99)
Potassium: 4 mmol/L (ref 3.5–5.1)
Sodium: 142 mmol/L (ref 135–145)
TOTAL PROTEIN: 6.3 g/dL — AB (ref 6.5–8.1)

## 2015-08-05 LAB — CBC WITH DIFFERENTIAL/PLATELET
BASOS ABS: 0.1 10*3/uL (ref 0.0–0.1)
Basophils Relative: 1 %
Eosinophils Absolute: 0.2 10*3/uL (ref 0.0–0.7)
Eosinophils Relative: 4 %
HEMATOCRIT: 35.4 % — AB (ref 36.0–46.0)
Hemoglobin: 11.8 g/dL — ABNORMAL LOW (ref 12.0–15.0)
LYMPHS PCT: 28 %
Lymphs Abs: 1.2 10*3/uL (ref 0.7–4.0)
MCH: 31.7 pg (ref 26.0–34.0)
MCHC: 33.3 g/dL (ref 30.0–36.0)
MCV: 95.2 fL (ref 78.0–100.0)
MONO ABS: 0.6 10*3/uL (ref 0.1–1.0)
Monocytes Relative: 14 %
NEUTROS ABS: 2.3 10*3/uL (ref 1.7–7.7)
Neutrophils Relative %: 53 %
Platelets: 249 10*3/uL (ref 150–400)
RBC: 3.72 MIL/uL — ABNORMAL LOW (ref 3.87–5.11)
RDW: 12.6 % (ref 11.5–15.5)
WBC: 4.4 10*3/uL (ref 4.0–10.5)

## 2015-08-05 MED ORDER — SODIUM CHLORIDE 0.9 % IV SOLN
2400.0000 mg/m2 | INTRAVENOUS | Status: DC
  Administered 2015-08-05: 3950 mg via INTRAVENOUS
  Filled 2015-08-05: qty 79

## 2015-08-05 MED ORDER — DEXTROSE 5 % IV SOLN
Freq: Once | INTRAVENOUS | Status: AC
  Administered 2015-08-05: 12:00:00 via INTRAVENOUS

## 2015-08-05 MED ORDER — ALBUTEROL SULFATE (2.5 MG/3ML) 0.083% IN NEBU
INHALATION_SOLUTION | RESPIRATORY_TRACT | Status: AC
  Filled 2015-08-05: qty 9

## 2015-08-05 MED ORDER — DIPHENHYDRAMINE HCL 50 MG/ML IJ SOLN
50.0000 mg | Freq: Once | INTRAMUSCULAR | Status: AC
  Administered 2015-08-05: 50 mg via INTRAVENOUS

## 2015-08-05 MED ORDER — METHYLPREDNISOLONE SODIUM SUCC 125 MG IJ SOLR
125.0000 mg | Freq: Once | INTRAMUSCULAR | Status: AC
  Administered 2015-08-05: 125 mg via INTRAVENOUS

## 2015-08-05 MED ORDER — SODIUM CHLORIDE 0.9 % IV SOLN
Freq: Once | INTRAVENOUS | Status: AC
  Administered 2015-08-05: 11:00:00 via INTRAVENOUS
  Filled 2015-08-05: qty 5

## 2015-08-05 MED ORDER — ALBUTEROL SULFATE (2.5 MG/3ML) 0.083% IN NEBU
5.0000 mg | INHALATION_SOLUTION | Freq: Once | RESPIRATORY_TRACT | Status: AC
  Administered 2015-08-05: 5 mg via RESPIRATORY_TRACT

## 2015-08-05 MED ORDER — PALONOSETRON HCL INJECTION 0.25 MG/5ML
0.2500 mg | Freq: Once | INTRAVENOUS | Status: AC
  Administered 2015-08-05: 0.25 mg via INTRAVENOUS

## 2015-08-05 MED ORDER — SODIUM CHLORIDE 0.9% FLUSH: 10.0000 mL | INTRAVENOUS | Status: DC | PRN

## 2015-08-05 MED ORDER — PALONOSETRON HCL INJECTION 0.25 MG/5ML
INTRAVENOUS | Status: AC
  Filled 2015-08-05: qty 5

## 2015-08-05 MED ORDER — OXALIPLATIN CHEMO INJECTION 100 MG/20ML
85.0000 mg/m2 | Freq: Once | INTRAVENOUS | Status: AC
  Administered 2015-08-05: 140 mg via INTRAVENOUS
  Filled 2015-08-05: qty 28

## 2015-08-05 MED ORDER — DEXTROSE 5 % IV SOLN
400.0000 mg/m2 | Freq: Once | INTRAVENOUS | Status: AC
  Administered 2015-08-05: 656 mg via INTRAVENOUS
  Filled 2015-08-05: qty 32.8

## 2015-08-05 MED ORDER — SODIUM CHLORIDE 0.9 % IV SOLN
INTRAVENOUS | Status: DC
  Administered 2015-08-05: 11:00:00 via INTRAVENOUS

## 2015-08-05 MED ORDER — DIPHENHYDRAMINE HCL 50 MG/ML IJ SOLN
INTRAMUSCULAR | Status: AC
  Filled 2015-08-05: qty 1

## 2015-08-05 MED ORDER — METHYLPREDNISOLONE SODIUM SUCC 125 MG IJ SOLR
INTRAMUSCULAR | Status: AC
  Filled 2015-08-05: qty 2

## 2015-08-05 NOTE — Patient Instructions (Addendum)
[facility] Discharge Instructions for Patients Receiving Chemotherapy   Beginning January 23rd 2017 lab work for the [facility] will be done in the  Main lab at [facility] on 1st floor. If you have a lab appointment with the [facility] please come in thru the  Main Entrance and check in at the main information desk   Today you received the following chemotherapy agents: Oxaliplatin, Fluorouracil, and Leucovorin.   You had a reaction to Oxaliplatin today, which was completely reversed with hypersensitivity protocol.  We will discuss our treatment options moving forward with you at your next office visit.   Please call 911 if you experience any difficulty breathing, rash, or other feelings of reaction.     If you develop nausea and vomiting, or diarrhea that is not controlled by your medication, call the clinic.  The clinic phone number is [phone]. Office hours are Monday-Friday 8:30am-5:00pm.  BELOW ARE SYMPTOMS THAT SHOULD BE REPORTED IMMEDIATELY:  *FEVER GREATER THAN 101.0 F  *CHILLS WITH OR WITHOUT FEVER  NAUSEA AND VOMITING THAT IS NOT CONTROLLED WITH YOUR NAUSEA MEDICATION  *UNUSUAL SHORTNESS OF BREATH  *UNUSUAL BRUISING OR BLEEDING  TENDERNESS IN MOUTH AND THROAT WITH OR WITHOUT PRESENCE OF ULCERS  *URINARY PROBLEMS  *BOWEL PROBLEMS  UNUSUAL RASH Items with * indicate a potential emergency and should be followed up as soon as possible. If you have an emergency after office hours please contact your primary care physician or go to the nearest emergency department.  Please call the clinic during office hours if you have any questions or concerns.   You may also contact the Patient Navigator at [phone] should you have any questions or need assistance in obtaining follow up care.

## 2015-08-05 NOTE — Patient Instructions (Addendum)
[place] at [facility] Discharge Instructions  RECOMMENDATIONS MADE BY THE CONSULTANT AND ANY TEST RESULTS WILL BE SENT TO YOUR REFERRING PHYSICIAN.  Exam and discussion today with Kirby Crigler, PA. Chemotherapy today - we will discontinue the 31fu bolus portion of your treatment. Chemotherapy in 2 weeks. Office visit in 2 weeks.    Thank you for choosing [place] at [facility] to provide your oncology and hematology care.  To afford each patient quality time with our provider, please arrive at least 15 minutes before your scheduled appointment time.   Beginning January 23rd 2017 lab work for the [facility] will be done in the  Main lab at [facility] on 1st floor. If you have a lab appointment with the [facility] please come in thru the  Main Entrance and check in at the main information desk  You need to re-schedule your appointment should you arrive 10 or more minutes late.  We strive to give you quality time with our providers, and arriving late affects you and other patients whose appointments are after yours.  Also, if you no show three or more times for appointments you may be dismissed from the clinic at the providers discretion.     Again, thank you for choosing [facility].  Our hope is that these requests will decrease the amount of time that you wait before being seen by our physicians.       _____________________________________________________________  Should you have questions after your visit to [facility], please contact our office at [phone] between the hours of 8:30 a.m. and 4:30 p.m.  Voicemails left after 4:30 p.m. will not be returned until the following business day.  For prescription refill requests, have your pharmacy contact our office.

## 2015-08-05 NOTE — Addendum Note (Signed)
Addended by: Baird Cancer on: 08/05/2015 05:43 PM   Modules accepted: Level of Service

## 2015-08-05 NOTE — Progress Notes (Signed)
1445-I entered the room when I heard Dr. Lynnell Fiumara Muse ask for oxygen for the patient.  Patient was having difficulty breathing and in obvious distress, red rash to neck and abdominal area.  Oxygen applied, vitals obtained: 195/102, 105, 22, 100% O2 saturation.  Benadryl 50mg  IV given, Solu-Medrol 125mg  IV given, Pepcid 100mg  IVPB given.  VS: 165/64, 87, 20, 100% O2 saturation.  Rapid response called and respiratory in to give a nebulizer treatment.  Patient breathing better and VSS after medications given.  Patient was monitored for additional 2 hours.  No further distress, VSS.  Home infusion pump applied and patient was monitored for additional 30 minutes.  VSS and patient ambulatory on discharge.  Patient was educated that if she had any feelings that were abnormal until she returned to the clinic, that she was to call 911 immediately.  She verbalized response.

## 2015-08-07 ENCOUNTER — Encounter (HOSPITAL_BASED_OUTPATIENT_CLINIC_OR_DEPARTMENT_OTHER): Payer: BLUE CROSS/BLUE SHIELD

## 2015-08-07 VITALS — BP 139/74 | HR 61 | Temp 98.0°F | Resp 18

## 2015-08-07 DIAGNOSIS — C801 Malignant (primary) neoplasm, unspecified: Secondary | ICD-10-CM | POA: Diagnosis not present

## 2015-08-07 DIAGNOSIS — Z452 Encounter for adjustment and management of vascular access device: Secondary | ICD-10-CM

## 2015-08-07 MED ORDER — HEPARIN SOD (PORK) LOCK FLUSH 100 UNIT/ML IV SOLN
INTRAVENOUS | Status: AC
  Filled 2015-08-07: qty 5

## 2015-08-07 MED ORDER — SODIUM CHLORIDE 0.9% FLUSH
10.0000 mL | INTRAVENOUS | Status: DC | PRN
  Administered 2015-08-07: 10 mL
  Filled 2015-08-07: qty 10

## 2015-08-07 MED ORDER — HEPARIN SOD (PORK) LOCK FLUSH 100 UNIT/ML IV SOLN
500.0000 [IU] | Freq: Once | INTRAVENOUS | Status: AC | PRN
  Administered 2015-08-07: 500 [IU]

## 2015-08-07 NOTE — Patient Instructions (Signed)
[place] at [facility] Discharge Instructions  RECOMMENDATIONS MADE BY THE CONSULTANT AND ANY TEST RESULTS WILL BE SENT TO YOUR REFERRING PHYSICIAN.  Discontinued infusion pump. Port flushed per protocol. Return as scheduled.  Thank you for choosing [place] at [facility] to provide your oncology and hematology care.  To afford each patient quality time with our provider, please arrive at least 15 minutes before your scheduled appointment time.   Beginning January 23rd 2017 lab work for the [facility] will be done in the  Main lab at [facility] on 1st floor. If you have a lab appointment with the [facility] please come in thru the  Main Entrance and check in at the main information desk  You need to re-schedule your appointment should you arrive 10 or more minutes late.  We strive to give you quality time with our providers, and arriving late affects you and other patients whose appointments are after yours.  Also, if you no show three or more times for appointments you may be dismissed from the clinic at the providers discretion.     Again, thank you for choosing [facility].  Our hope is that these requests will decrease the amount of time that you wait before being seen by our physicians.       _____________________________________________________________  Should you have questions after your visit to [facility], please contact our office at [phone] between the hours of 8:30 a.m. and 4:30 p.m.  Voicemails left after 4:30 p.m. will not be returned until the following business day.  For prescription refill requests, have your pharmacy contact our office.

## 2015-08-07 NOTE — Progress Notes (Signed)
Discontinued continuous infusion pump. Flushed port per protocol removed port needle. Patient reports mild nausea associated with chemo. Reports that she has not been using prescribed antiemetics, encouraged patient to take meds as prescribed and assured her she would not become "addicted" to nausea meds.

## 2015-08-08 ENCOUNTER — Encounter: Payer: Self-pay | Admitting: Unknown Physician Specialty

## 2015-08-11 ENCOUNTER — Encounter (HOSPITAL_COMMUNITY): Payer: Self-pay | Admitting: Hematology & Oncology

## 2015-08-11 ENCOUNTER — Telehealth (HOSPITAL_COMMUNITY): Payer: Self-pay | Admitting: *Deleted

## 2015-08-11 NOTE — Telephone Encounter (Signed)
I called pt to see how she was doing. Pt felt very good yday and today she says she is paying for it. She had a lot of energy yday and today she is more sluggish. Pt is otherwise doing ok post 2nd chemo.

## 2015-08-15 ENCOUNTER — Telehealth: Payer: Self-pay | Admitting: Genetic Counselor

## 2015-08-15 ENCOUNTER — Other Ambulatory Visit (HOSPITAL_COMMUNITY): Payer: Self-pay | Admitting: Oncology

## 2015-08-18 ENCOUNTER — Encounter: Payer: Self-pay | Admitting: Genetic Counselor

## 2015-08-18 ENCOUNTER — Telehealth: Payer: Self-pay | Admitting: Genetic Counselor

## 2015-08-18 DIAGNOSIS — Z1379 Encounter for other screening for genetic and chromosomal anomalies: Secondary | ICD-10-CM | POA: Insufficient documentation

## 2015-08-18 NOTE — Telephone Encounter (Signed)
Revealed negative genetic testing on the Comprehensive cancer panel.

## 2015-08-18 NOTE — Telephone Encounter (Signed)
Left message regarding availability of genetic test results.

## 2015-08-19 ENCOUNTER — Encounter (HOSPITAL_COMMUNITY): Payer: BLUE CROSS/BLUE SHIELD | Attending: Hematology & Oncology

## 2015-08-19 ENCOUNTER — Ambulatory Visit: Payer: Self-pay | Admitting: Genetic Counselor

## 2015-08-19 ENCOUNTER — Encounter (HOSPITAL_BASED_OUTPATIENT_CLINIC_OR_DEPARTMENT_OTHER): Payer: BLUE CROSS/BLUE SHIELD | Admitting: Hematology & Oncology

## 2015-08-19 ENCOUNTER — Encounter (HOSPITAL_COMMUNITY): Payer: Self-pay | Admitting: Hematology & Oncology

## 2015-08-19 VITALS — BP 132/51 | HR 60 | Temp 98.2°F | Resp 18

## 2015-08-19 VITALS — BP 153/78 | HR 60 | Temp 98.0°F | Resp 18 | Wt 127.8 lb

## 2015-08-19 DIAGNOSIS — T50905D Adverse effect of unspecified drugs, medicaments and biological substances, subsequent encounter: Secondary | ICD-10-CM

## 2015-08-19 DIAGNOSIS — Z1379 Encounter for other screening for genetic and chromosomal anomalies: Secondary | ICD-10-CM

## 2015-08-19 DIAGNOSIS — Z8041 Family history of malignant neoplasm of ovary: Secondary | ICD-10-CM

## 2015-08-19 DIAGNOSIS — Z5111 Encounter for antineoplastic chemotherapy: Secondary | ICD-10-CM | POA: Diagnosis not present

## 2015-08-19 DIAGNOSIS — Z800 Family history of malignant neoplasm of digestive organs: Secondary | ICD-10-CM

## 2015-08-19 DIAGNOSIS — C801 Malignant (primary) neoplasm, unspecified: Secondary | ICD-10-CM

## 2015-08-19 LAB — COMPREHENSIVE METABOLIC PANEL
ALBUMIN: 4 g/dL (ref 3.5–5.0)
ALK PHOS: 48 U/L (ref 38–126)
ALT: 27 U/L (ref 14–54)
AST: 25 U/L (ref 15–41)
Anion gap: 7 (ref 5–15)
BILIRUBIN TOTAL: 0.6 mg/dL (ref 0.3–1.2)
BUN: 16 mg/dL (ref 6–20)
CALCIUM: 9.1 mg/dL (ref 8.9–10.3)
CO2: 28 mmol/L (ref 22–32)
CREATININE: 0.58 mg/dL (ref 0.44–1.00)
Chloride: 104 mmol/L (ref 101–111)
GFR calc Af Amer: >60 mL/min (ref >60)
GFR calc non Af Amer: >60 mL/min (ref >60)
GLUCOSE: 101 mg/dL — AB (ref 65–99)
Potassium: 3.5 mmol/L (ref 3.5–5.1)
SODIUM: 139 mmol/L (ref 135–145)
Total Protein: 6.7 g/dL (ref 6.5–8.1)

## 2015-08-19 LAB — CBC WITH DIFFERENTIAL/PLATELET
BASOS PCT: 1 %
Basophils Absolute: 0 10*3/uL (ref 0.0–0.1)
EOS ABS: 0.1 10*3/uL (ref 0.0–0.7)
Eosinophils Relative: 2 %
HEMATOCRIT: 36.2 % (ref 36.0–46.0)
HEMOGLOBIN: 12.2 g/dL (ref 12.0–15.0)
LYMPHS ABS: 1.2 10*3/uL (ref 0.7–4.0)
Lymphocytes Relative: 26 %
MCH: 31.8 pg (ref 26.0–34.0)
MCHC: 33.7 g/dL (ref 30.0–36.0)
MCV: 94.3 fL (ref 78.0–100.0)
Monocytes Absolute: 0.6 10*3/uL (ref 0.1–1.0)
Monocytes Relative: 12 %
NEUTROS ABS: 2.8 10*3/uL (ref 1.7–7.7)
NEUTROS PCT: 59 %
Platelets: 164 10*3/uL (ref 150–400)
RBC: 3.84 MIL/uL — AB (ref 3.87–5.11)
RDW: 13.5 % (ref 11.5–15.5)
WBC: 4.7 10*3/uL (ref 4.0–10.5)

## 2015-08-19 MED ORDER — METHYLPREDNISOLONE SODIUM SUCC 40 MG IJ SOLR
40.0000 mg | Freq: Once | INTRAMUSCULAR | Status: AC
  Administered 2015-08-19: 40 mg via INTRAVENOUS

## 2015-08-19 MED ORDER — METHYLPREDNISOLONE SODIUM SUCC 40 MG IJ SOLR
INTRAMUSCULAR | Status: AC
  Filled 2015-08-19: qty 1

## 2015-08-19 MED ORDER — PALONOSETRON HCL INJECTION 0.25 MG/5ML
0.2500 mg | Freq: Once | INTRAVENOUS | Status: AC
  Administered 2015-08-19: 0.25 mg via INTRAVENOUS

## 2015-08-19 MED ORDER — OXALIPLATIN CHEMO INJECTION FOR DESENSITIZATION 100 MG/20ML
14.0000 mg | Freq: Once | INTRAVENOUS | Status: AC
  Administered 2015-08-19: 14 mg via INTRAVENOUS
  Filled 2015-08-19: qty 2.8

## 2015-08-19 MED ORDER — SODIUM CHLORIDE 0.9 % IV SOLN
Freq: Once | INTRAVENOUS | Status: AC
  Administered 2015-08-19: 11:00:00 via INTRAVENOUS
  Filled 2015-08-19: qty 5

## 2015-08-19 MED ORDER — SODIUM CHLORIDE 0.9% FLUSH: 10.0000 mL | INTRAVENOUS | Status: DC | PRN

## 2015-08-19 MED ORDER — PALONOSETRON HCL INJECTION 0.25 MG/5ML
INTRAVENOUS | Status: AC
  Filled 2015-08-19: qty 5

## 2015-08-19 MED ORDER — DIPHENHYDRAMINE HCL 50 MG/ML IJ SOLN
INTRAMUSCULAR | Status: AC
  Filled 2015-08-19: qty 1

## 2015-08-19 MED ORDER — DIPHENHYDRAMINE HCL 50 MG/ML IJ SOLN
25.0000 mg | Freq: Once | INTRAMUSCULAR | Status: AC
  Administered 2015-08-19: 25 mg via INTRAVENOUS

## 2015-08-19 MED ORDER — OXALIPLATIN CHEMO INJECTION FOR DESENSITIZATION 100 MG/20ML
0.0140 mg | Freq: Once | INTRAVENOUS | Status: AC
  Administered 2015-08-19: 0.014 mg via INTRAVENOUS
  Filled 2015-08-19: qty 0

## 2015-08-19 MED ORDER — DEXTROSE 5 % IV SOLN
Freq: Once | INTRAVENOUS | Status: AC
  Administered 2015-08-19: 11:00:00 via INTRAVENOUS

## 2015-08-19 MED ORDER — OXALIPLATIN CHEMO INJECTION FOR DESENSITIZATION 100 MG/20ML
1.4000 mg | Freq: Once | INTRAVENOUS | Status: AC
  Administered 2015-08-19: 1.4 mg via INTRAVENOUS
  Filled 2015-08-19: qty 0.28

## 2015-08-19 MED ORDER — OXALIPLATIN CHEMO INJECTION FOR DESENSITIZATION 100 MG/20ML
0.1400 mg | Freq: Once | INTRAVENOUS | Status: AC
  Administered 2015-08-19: 0.14 mg via INTRAVENOUS
  Filled 2015-08-19: qty 0.03

## 2015-08-19 MED ORDER — LEUCOVORIN CALCIUM INJECTION 350 MG
400.0000 mg/m2 | Freq: Once | INTRAVENOUS | Status: AC
  Administered 2015-08-19: 656 mg via INTRAVENOUS
  Filled 2015-08-19: qty 32.8

## 2015-08-19 MED ORDER — OXALIPLATIN CHEMO INJECTION FOR DESENSITIZATION 100 MG/20ML
126.0000 mg | Freq: Once | INTRAVENOUS | Status: AC
  Administered 2015-08-19: 126 mg via INTRAVENOUS
  Filled 2015-08-19: qty 25.2

## 2015-08-19 MED ORDER — FLUOROURACIL CHEMO INJECTION 5 GM/100ML
2400.0000 mg/m2 | INTRAVENOUS | Status: DC
  Administered 2015-08-19: 3950 mg via INTRAVENOUS
  Filled 2015-08-19: qty 79

## 2015-08-19 NOTE — Progress Notes (Signed)
1645- Patient c/o feeling "hot and flush".  VSS.  PA in the room and ordered 40mg  of Solu-Medrol IV.  This was given and to start Oxaliplatin in 10 minutes post steroid administration.  Patient asymptomatic at this time.    1700 Benadryl 25mg  IV given per instruction on Dr. Arav Bannister Muse.  Oxaliplatin restarted and patient tolerated well throughout the rest of the infusion.  VSS.  Home infusion pump attached.  Patient left clinic ambulatory.

## 2015-08-19 NOTE — Patient Instructions (Signed)
..  [place] at [facility] Discharge Instructions  RECOMMENDATIONS MADE BY THE CONSULTANT AND ANY TEST RESULTS WILL BE SENT TO YOUR REFERRING PHYSICIAN.  Because you had a reaction to the oxaliplatin, we will do a desensitization protocol to reintroduce this drug to you body This will be a slow titration of the drug Exam per Dr. Whitney Muse today   Thank you for choosing [place] at [facility] to provide your oncology and hematology care.  To afford each patient quality time with our provider, please arrive at least 15 minutes before your scheduled appointment time.   Beginning January 23rd 2017 lab work for the [facility] will be done in the  Main lab at [facility] on 1st floor. If you have a lab appointment with the [facility] please come in thru the  Main Entrance and check in at the main information desk  You need to re-schedule your appointment should you arrive 10 or more minutes late.  We strive to give you quality time with our providers, and arriving late affects you and other patients whose appointments are after yours.  Also, if you no show three or more times for appointments you may be dismissed from the clinic at the providers discretion.     Again, thank you for choosing [facility].  Our hope is that these requests will decrease the amount of time that you wait before being seen by our physicians.       _____________________________________________________________  Should you have questions after your visit to [facility], please contact our office at [phone] between the hours of 8:30 a.m. and 4:30 p.m.  Voicemails left after 4:30 p.m. will not be returned until the following business day.  For prescription refill requests, have your pharmacy contact our office.         Resources For Cancer Patients and their Caregivers ? American Cancer Society: Can assist with transportation, wigs,  general needs, runs Look Good Feel Better.        5718148249 ? Cancer Care: Provides financial assistance, online support groups, medication/co-pay assistance.  1-800-813-HOPE 564-730-0154) ? [facility] Assists [place] cancer patients and their families through emotional , educational and financial support.  (831)143-6187 ? [place] DSS Where to apply for food stamps, Medicaid and utility assistance. (858) 746-7951 ? [facility]: Transportation to medical appointments. 7477428516 ? Social Security Administration: May apply for disability if have a Stage IV cancer. (603)310-2981 3615456037 ? [employer], Disability and Transit Services: Assists with nutrition, care and transit needs. [phone]

## 2015-08-19 NOTE — Progress Notes (Signed)
[place] at [place], MD [address] 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 Prominent right gonadal vasculature. Tiny hypodensity in the dome of the liver is too small to characterize, but stable.   06/26/2015 Pathology Results Adnexa - ovary +/- tube, neoplastic, right - MUCINOUS ADENOCARCINOMA, 13 CM. - OVARIAN SURFACE NOT INVOLVED BY TUMOR. - UNREMARKABLE FALLOPIAN TUBE WITH NO ENDOMETRIOSIS OR MALIGNANCY.   06/26/2015 Procedure Exploratory laparotomy with total hysterectomy bilateral salpingo-oophorectomy, pelvic and para-aortic lymph node dissection, omentectomy washings and biopsies for ovarian cancer by Dr. Janie Ball   06/27/2015 Pathology Results PERITONEAL WASHING(SPECIMEN 1 OF 1 COLLECTED 06/27/15): ATYPICAL CELLS. Immunohistochemistry calretinin, cytokeratin 5/6, estrogen receptor, progesterone receptor and WT-1 is performed. The morphology and immunophenotype favor reactive mesothelial cells   07/10/2015 Initial Diagnosis Mucinous adenocarcinoma ([place])   07/22/2015 -  Chemotherapy FOLFOX   07/24/2015 Survivorship Genetic Counseling consultation with Natalie Ball.   07/26/2015 Adverse Reaction Loose stools, 2-3 per 24 hours   07/29/2015 Treatment Plan Change 5FU bolus discontinued due to loose stools.    CURRENT THERAPY: FOLFOX  INTERVAL HISTORY: Natalie Ball 65 y.o. female returns for follow-up of mucinous adenocarcinoma.   Natalie Ball returns today accompanied by her niece, Natalie Ball. At the start of her appointment, she is having her  port accessed, including blood drawn.  She had a drug reaction to Oxaliplatin, and feels fine about starting desensitization treatment today, saying "I'm easy to get along with."  She will obtain a colonoscopy once her treatments are over in 6 months. She wishes to do this in [place] with Dr. Britta Ball and has seen him in consultation. She has also discussed genetics with Natalie Ball.  She has no questions today. She notes that her fingers and toes are okay right now but she did have problems with cold sensitivity after treatment. She says she is eating a lot and is "excited about that," and "I want to put some more fat on my butt."  She says she usually can't eat huge amounts, but she "can't quit eating" these days, which she's happy about. In terms of bowels, she says "either I'm going to have diarrhea," which is usually started by not being able to go at all. To treat this, she notes that she was taking stool softeners which "about wiped me out." She comments that ever since she had her hysterectomy, everything is "different in her stomach," but that this is finally starting to get better. She says overall, "it's all right."  She notes that her hair is coming out.   MEDICAL HISTORY: Past Medical History  Diagnosis Date  . Allergy   . GERD (gastroesophageal reflux disease)   . Complication of anesthesia   . PONV (postoperative nausea and vomiting)   . Wears glasses   . Tinnitus   . Vertigo   . Pneumonia   . History of bronchitis   . Urinary urgency   . Headache   . Mucinous adenocarcinoma ([place]) 07/10/2015  . Family history of ovarian cancer   . Family history of pancreatic cancer   . Family history of colon cancer  has Mucinous adenocarcinoma ([place]); Family history of ovarian cancer; Family history of pancreatic cancer; Family history of colon cancer; and Genetic testing on her problem list.     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  @MEDADMINPROSE @  SURGICAL HISTORY: Past Surgical History    Procedure Laterality Date  . Tubal ligation  1974  . Diagnostic laparoscopy  1977  . Rectal fusula  1980  . Appendectomy    . Laparotomy N/A 06/26/2015    Procedure: EXPLORATORY LAPAROTOMY;  Surgeon: Natalie Morning, MD;  Location: [facility];  Service: Gynecology;  Laterality: N/A;  . Abdominal hysterectomy N/A 06/26/2015    Procedure: TOTAL HYSTERECTOMY ABDOMINAL;  Surgeon: Natalie Morning, MD;  Location: [facility];  Service: Gynecology;  Laterality: N/A;  . Salpingoophorectomy Bilateral 06/26/2015    Procedure: SALPINGO OOPHORECTOMY;  Surgeon: Natalie Morning, MD;  Location: [facility];  Service: Gynecology;  Laterality: Bilateral;  . Omentectomy N/A 06/26/2015    Procedure:  OMENTECTOMY;  Surgeon: Natalie Morning, MD;  Location: [facility];  Service: Gynecology;  Laterality: N/A;  . Debulking N/A 06/26/2015    Procedure: bilateral periaortic lymph node dissection biopsies and peritoneal washings;  Surgeon: Natalie Morning, MD;  Location: [facility];  Service: Gynecology;  Laterality: N/A;  . Portacath placement Left 07/18/2015    Procedure: INSERTION PORT-A-CATH;  Surgeon: Natalie Signs, MD;  Location: [facility];  Service: General;  Laterality: Left;  pt knows to arrive at 6:15    SOCIAL HISTORY: Social History   Social History  . Marital Status: Widowed    Spouse Name: N/A  . Number of Children: 1  . Years of Education: N/A   Occupational History  . Not on file.   Social History Main Topics  . Smoking status: Former Smoker -- 1.00 packs/day for 19 years    Types: Cigarettes    Quit date: 06/14/1988  . Smokeless tobacco: Never Used  . Alcohol Use: Yes     Comment: occasionally   . Drug Use: No  . Sexual Activity: Not on file   Other Topics Concern  . Not on file   Social History Narrative  reports that she quit smoking about 27 years ago. Her smoking use included Cigarettes. She has a 19 pack-year smoking history. She has never used smokeless tobacco. She reports that she drinks alcohol. She reports  that she does not use illicit drugs. She reports that prior to her illness, she enjoyed an intermittent beer or marguirtia. She denies being a heavy drinker of EtOH. She notes that she is religious and she associates herselft with [place]. 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 She is widowed  FAMILY HISTORY: Family History  Problem Relation Age of Onset  . Cancer Father   . Colon polyps Father   . Ovarian cancer Maternal Aunt   . Colon cancer Maternal Aunt   . Pancreatic cancer Maternal Uncle   . Colon polyps Mother   . Colon polyps Sister   . Cancer Paternal Aunt     NOS  . Heart defect Maternal Grandmother     hole in the heart  . COPD Maternal Grandfather   . Colon cancer Paternal Grandmother     dx possibly under 53  . Mental illness Sister   . Uterine cancer Maternal Aunt     dx possibly in her 67s  . Pancreatic cancer Maternal Aunt   . Cancer Paternal Aunt     NOS  . Cancer Cousin     adrenal  cancer dx in his 31s; maternal cousin  . Ovarian cancer Cousin     dx in her 70s; maternal cousin  . Diabetes Daughter   Mother is alive at the age of 64 with heart disease, hypothyroidism, and valgus malformation of her ankles bilaterally inhibiting her ability to ambulate well. Father is 42 years old and alive with HTN and a mild form of dementia.  Sister is 96 yo with GERD Sister is 84 yo who was estranged from the patient until most recently. Sister 16 yo who has some sort of mental disorder requiring medications. She has 1 daughter who is 15 years old with DM and hypothyroidism. She has 1 granddaughter who is 42 yo who is overweight, but otherwise healthy. She is in college in [place], [place].  Review of Systems  Constitutional: Negative for fever, chills, weight loss and malaise/fatigue.  HENT: Negative for congestion, hearing loss, nosebleeds, sore throat and tinnitus.   Eyes: Negative for blurred vision, double vision, pain and discharge.    Respiratory: Negative for cough, hemoptysis, sputum production, shortness of breath and wheezing.   Cardiovascular: Negative for chest pain, palpitations, claudication, leg swelling and PND.  Gastrointestinal: Negative for heartburn, nausea, vomiting, abdominal pain, diarrhea, constipation, blood in stool and melena.  Genitourinary: Negative for dysuria, urgency, frequency and hematuria.  Musculoskeletal: Negative for myalgias, joint pain and falls.  Skin: Negative for itching and rash.  Neurological: Negative for dizziness, tingling, tremors, sensory change, speech change, focal weakness, seizures, loss of consciousness, weakness and headaches.  Endo/Heme/Allergies: Does not bruise/bleed easily.  Psychiatric/Behavioral: Negative for depression, suicidal ideas, memory loss and substance abuse. The patient is not nervous/anxious and does not have insomnia.    14 point review of systems was performed and is negative except as detailed under history of present illness and above   PHYSICAL EXAMINATION  ECOG PERFORMANCE STATUS: 0 - Asymptomatic  Filed Vitals:   08/19/15 0850  BP: 153/78  Pulse: 60  Temp: 98 F (36.7 C)  Resp: 18    Physical Exam  Constitutional: She is oriented to person, place, and time and well-developed, well-nourished, and in no distress.  HENT:  Head: Normocephalic and atraumatic.  Nose: Nose normal.  Mouth/Throat: Oropharynx is clear and moist. No oropharyngeal exudate.  Eyes: Conjunctivae and EOM are normal. Pupils are equal, round, and reactive to light. Right eye exhibits no discharge. Left eye exhibits no discharge. No scleral icterus.  Neck: Normal range of motion. Neck supple. No tracheal deviation present. No thyromegaly present.  Cardiovascular: Normal rate, regular rhythm and normal heart sounds.  Exam reveals no gallop and no friction rub.   No murmur heard. Pulmonary/Chest: Effort normal and breath sounds normal. She has no wheezes. She has no rales.   Abdominal: Soft. Bowel sounds are normal. She exhibits no distension and no mass. There is no tenderness. There is no rebound and no guarding.  Well healed surgical incision site  Musculoskeletal: Normal range of motion. She exhibits no edema.  Lymphadenopathy:    She has no cervical adenopathy.  Neurological: She is alert and oriented to person, place, and time. She has normal reflexes. No cranial nerve deficit. Gait normal. Coordination normal.  Skin: Skin is warm and dry. No rash noted.  Psychiatric: Mood, memory, affect and judgment normal.  Nursing note and vitals reviewed.   LABORATORY DATA: I have reviewed the data as listed.  Results for Natalie, Ball (MRN [id number])   Ref. Range 07/10/2015 16:05  CA 125 Latest Ref  Range: 0.0-38.1 U/mL 304.9 (H)   Results for Natalie, Ball (MRN [id number]) as of 08/20/2015 16:54  Ref. Range 08/19/2015 09:19  Sodium Latest Ref Range: 135-145 mmol/L 139  Potassium Latest Ref Range: 3.5-5.1 mmol/L 3.5  Chloride Latest Ref Range: 101-111 mmol/L 104  CO2 Latest Ref Range: 22-32 mmol/L 28  BUN Latest Ref Range: 6-20 mg/dL 16  Creatinine Latest Ref Range: 0.44-1.00 mg/dL 0.58  Calcium Latest Ref Range: 8.9-10.3 mg/dL 9.1  EGFR (Non-African Amer.) Latest Ref Range: >60 mL/min >60  EGFR (African American) Latest Ref Range: >60 mL/min >60  Glucose Latest Ref Range: 65-99 mg/dL 101 (H)  Anion gap Latest Ref Range: 5-15  7  Alkaline Phosphatase Latest Ref Range: 38-126 U/L 48  Albumin Latest Ref Range: 3.5-5.0 g/dL 4.0  AST Latest Ref Range: 15-41 U/L 25  ALT Latest Ref Range: 14-54 U/L 27  Total Protein Latest Ref Range: 6.5-8.1 g/dL 6.7  Total Bilirubin Latest Ref Range: 0.3-1.2 mg/dL 0.6  WBC Latest Ref Range: 4.0-10.5 K/uL 4.7  RBC Latest Ref Range: 3.87-5.11 MIL/uL 3.84 (L)  Hemoglobin Latest Ref Range: 12.0-15.0 g/dL 12.2  HCT Latest Ref Range: 36.0-46.0 % 36.2  MCV Latest Ref Range: 78.0-100.0 fL 94.3  MCH Latest Ref Range: 26.0-34.0 pg  31.8  MCHC Latest Ref Range: 30.0-36.0 g/dL 33.7  RDW Latest Ref Range: 11.5-15.5 % 13.5  Platelets Latest Ref Range: 150-400 K/uL 164  Neutrophils Latest Units: % 59  Lymphocytes Latest Units: % 26  Monocytes Relative Latest Units: % 12  Eosinophil Latest Units: % 2  Basophil Latest Units: % 1  NEUT# Latest Ref Range: 1.7-7.7 K/uL 2.8  Lymphocyte # Latest Ref Range: 0.7-4.0 K/uL 1.2  Monocyte # Latest Ref Range: 0.1-1.0 K/uL 0.6  Eosinophils Absolute Latest Ref Range: 0.0-0.7 K/uL 0.1  Basophils Absolute Latest Ref Range: 0.0-0.1 K/uL 0.0  CA 125 Latest Ref Range: 0.0-38.1 U/mL 40.6 (H)      RADIOGRAPHIC STUDIES: I have personally reviewed the radiological images as listed and agreed with the findings in the report.  Study Result     CLINICAL DATA: Cystic ovarian mass.  EXAM: CT CHEST, ABDOMEN, AND PELVIS WITH CONTRAST  TECHNIQUE: Multidetector CT imaging of the chest, abdomen and pelvis was performed following the standard protocol during bolus administration of intravenous contrast.  CONTRAST: 19m OMNIPAQUE IOHEXOL 300 MG/ML SOLN  COMPARISON: Pelvic MRI from 06/12/2015. CT chest from 04/07/2015.  FINDINGS: CT CHEST FINDINGS  Mediastinum/Lymph Nodes: There is no axillary lymphadenopathy. No mediastinal lymphadenopathy. There is no hilar lymphadenopathy. The heart size is normal. No pericardial effusion. The esophagus has normal imaging features. Small hiatal hernia noted.  Lungs/Pleura: No focal airspace consolidation. No pulmonary edema or pleural effusion. 4 mm left lower lobe pulmonary nodule (image 37 series 4) is stable since 04/07/2015. 3 mm right middle lobe nodule on the same image is also stable.  Musculoskeletal: Bone windows reveal no worrisome lytic or sclerotic osseous lesions.  CT ABDOMEN PELVIS FINDINGS  Hepatobiliary: 5 mm hypodensity in the dome of the liver is stable since 04/07/2015. Geographic low attenuation of the liver  parenchyma is compatible with fatty deposition. There is no evidence for gallstones, gallbladder wall thickening, or pericholecystic fluid. No intrahepatic or extrahepatic biliary dilation.  Pancreas: No focal mass lesion. No dilatation of the main duct. No intraparenchymal cyst. No peripancreatic edema.  Spleen: No splenomegaly. No focal mass lesion.  Adrenals/Urinary Tract: No adrenal nodule or mass. 7 mm low-density lesion extreme upper pole right kidney too small  to characterize but likely a tiny cyst. Left kidney unremarkable. No evidence for hydroureter. Bladder is decompressed.  Stomach/Bowel: Small hiatal hernia. Stomach otherwise unremarkable. Duodenum is normally positioned as is the ligament of Treitz. No small bowel wall thickening. No small bowel dilatation. The terminal ileum is normal. The appendix is not visualized, but there is no edema or inflammation in the region of the cecum. Diverticuli are seen scattered along the entire length of the colon without CT findings of diverticulitis.  Vascular/Lymphatic: No abdominal aortic aneurysm. No abdominal atherosclerotic calcification. There is no gastrohepatic or hepatoduodenal ligament lymphadenopathy. No intraperitoneal or retroperitoneal lymphadenopy. No pelvic sidewall lymphadenopathy.  Reproductive: Uterus is unremarkable aside from displacement by the large pelvic mass. 23.4 x 11.6 x 25.8 cm multiloculated complex cystic lesion is identified in the anterior aspect of the central pelvis. This mass appears to be distinct from both the uterus and left ovary in the right gonadal vasculature tracks into the region of this mass. Imaging features are highly suspicious for cystic ovarian neoplasm.  Other: Trace intraperitoneal free fluid is seen in the cul-de-sac and adjacent to the liver tip.  Musculoskeletal: Bone windows reveal no worrisome lytic or sclerotic osseous lesions.  IMPRESSION: 1. Complex  cystic central pelvic mass as seen previously. 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 2. Prominent right gonadal vasculature. 3. Small hiatal hernia. 4. Tiny hypodensity in the dome of the liver is too small to characterize, but is unchanged since 04/07/2015. 5. Trace intraperitoneal free fluid including adjacent to the right liver and in the central pelvis.   Electronically Signed  By: Misty Stanley M.D.  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 Persistent post operative elevation in CA-125 at 304.9 U/ml, normal post-op CEA Oxaliplatin reaction  We discussed how she needs to undergo a desensitization protocol for oxaliplatin. The pharmacy already has it down that she will receive a desensitization protocol today. We discussed risks/benefits of the procedure, she wishes to proceed.   Since she's had two treatments, we will check a CA-125 today. I'll let her know when that comes back, in about two days.  She will return in two weeks for ongoing chemotherapy and physical exam.  Orders Placed This Encounter  Procedures  . CA 125    Standing Status: Future     Number of Occurrences: 1     Standing Expiration Date: 08/18/2016    All questions were answered. The patient knows to call the clinic with any problems, questions or concerns. We can certainly see the patient much sooner if necessary. . This document serves as a record of services personally performed by Ancil Linsey, MD. It was created on her behalf by Toni Amend, a trained medical scribe. The creation of this record is based on the scribe's personal observations and the provider's statements to them. This document has been checked and approved by the attending provider.  I have reviewed the above documentation for  accuracy and completeness, and I agree with the above.  This note was electronically signed. Molli Hazard, MD  08/19/2015

## 2015-08-19 NOTE — Progress Notes (Signed)
HPI: Ms. Blaydes was previously seen in the [place] clinic due to a personal and family history of cancer and concerns regarding a hereditary predisposition to cancer. Please refer to our prior cancer genetics clinic note for more information regarding Ms. Fogelman's medical, social and family histories, and our assessment and recommendations, at the time. Ms. Jentsch recent genetic test results were disclosed to her, as were recommendations warranted by these results. These results and recommendations are discussed in more detail below.  FAMILY HISTORY:  We obtained a detailed, 4-generation family history.  Significant diagnoses are listed below: Family History  Problem Relation Age of Onset  . Cancer Father   . Colon polyps Father   . Ovarian cancer Maternal Aunt   . Colon cancer Maternal Aunt   . Pancreatic cancer Maternal Uncle   . Colon polyps Mother   . Colon polyps Sister   . Cancer Paternal Aunt     NOS  . Heart defect Maternal Grandmother     hole in the heart  . COPD Maternal Grandfather   . Colon cancer Paternal Grandmother     dx possibly under 56  . Mental illness Sister   . Uterine cancer Maternal Aunt     dx possibly in her 83s  . Pancreatic cancer Maternal Aunt   . Cancer Paternal Aunt     NOS  . Cancer Cousin     adrenal cancer dx in his 77s; maternal cousin  . Ovarian cancer Cousin     dx in her 17s; maternal cousin  . Diabetes Daughter     The patient has one daughter, and three sisters, none of whom have cancer. One sister may have had colon polyps. Both parents have had colon polyps in the past. Her father is 1 and has early dementia. He had two bothers and three sisters, two of whom had an unknown form of cancer. Her father's mother had colon cancer, possibly under 90. The patient's mother is alive at 66. She has two sisters and two brothers. One sister was diagnosed with possibly uterine cancer and pancreatic cancer, and the other  sister was diagnosed with ovarian cancer and colon cancer. One brother died of pancreatic cancer and had a son who died of adrenal cancer. The other brother had one daughter die from ovarian cancer and another who reportedly had ovarian cancer, but indicates that the family prayed for her and it went away. Patient's maternal ancestors are of [place] and [place] descent, and paternal ancestors are of [place] descent. There is no reported Ashkenazi Jewish ancestry. There is no known consanguinity.  GENETIC TEST RESULTS: At the time of Ms. Younge's visit, we recommended she pursue genetic testing of the comprehensive cancer gene panel and MSH2 inversion. The Comprehensive Cancer Panel offered by [facility] includes sequencing and/or deletion duplication testing of the following 32 genes: APC, ATM, AXIN2, BARD1, BMPR1A, BRCA1, BRCA2, BRIP1, CDH1, CDK4, CDKN2A, CHEK2, EPCAM, FANCC, MLH1, MSH2, MSH6, MUTYH, NBN, PALB2, PMS2, POLD1, POLE, PTEN, RAD51C, RAD51D, SCG5/GREM1, SMAD4, STK11, TP53, VHL, and XRCC2.   The report date is August 13, 2015.  Genetic testing was normal, and did not reveal a deleterious mutation in these genes. The test report has been scanned into EPIC and is located under the Molecular Pathology section of the Results Review tab.   We discussed with Ms. Oplinger that since the current genetic testing is not perfect, it is possible there may be a gene mutation in one of these genes  that current testing cannot detect, but that chance is small. We also discussed, that it is possible that another gene that has not yet been discovered, or that we have not yet tested, is responsible for the cancer diagnoses in the family, and it is, therefore, important to remain in touch with cancer genetics in the future so that we can continue to offer Ms. Mcnaught the most up to date genetic testing.   CANCER SCREENING RECOMMENDATIONS: This result is reassuring and indicates that Ms. Ortner likely does not have an  increased risk for a future cancer due to a mutation in one of these genes. This normal test also suggests that Ms. Mangine's cancer was most likely not due to an inherited predisposition associated with one of these genes.  Most cancers happen by chance and this negative test suggests that her cancer falls into this category.  We, therefore, recommended she continue to follow the cancer management and screening guidelines provided by her oncology and primary healthcare provider.   RECOMMENDATIONS FOR FAMILY MEMBERS: Women in this family might be at some increased risk of developing cancer, over the general population risk, simply due to the family history of cancer. We recommended women in this family have a yearly mammogram beginning at age 54, or 58 years younger than the earliest onset of cancer, an an annual clinical breast exam, and perform monthly breast self-exams. Women in this family should also have a gynecological exam as recommended by their primary provider. All family members should have a colonoscopy by age 65.  FOLLOW-UP: Lastly, we discussed with Ms. Wahlert that cancer genetics is a rapidly advancing field and it is possible that new genetic tests will be appropriate for her and/or her family members in the future. We encouraged her to remain in contact with cancer genetics on an annual basis so we can update her personal and family histories and let her know of advances in cancer genetics that may benefit this family.   Our contact number was provided. Ms. Haynie questions were answered to her satisfaction, and she knows she is welcome to call us at anytime with additional questions or concerns.   [name], MS, [facility] Certified Genetic Counselor [email or web address]

## 2015-08-19 NOTE — Patient Instructions (Signed)
[facility] Discharge Instructions for Patients Receiving Chemotherapy   Beginning January 23rd 2017 lab work for the [facility] will be done in the  Main lab at [facility] on 1st floor. If you have a lab appointment with the [facility] please come in thru the  Main Entrance and check in at the main information desk   Today you received the following chemotherapy agents: Oxaliplatin, fluorouracil, and Leucovorin.     If you develop nausea and vomiting, or diarrhea that is not controlled by your medication, call the clinic.  The clinic phone number is [phone]. Office hours are Monday-Friday 8:30am-5:00pm.  BELOW ARE SYMPTOMS THAT SHOULD BE REPORTED IMMEDIATELY:  *FEVER GREATER THAN 101.0 F  *CHILLS WITH OR WITHOUT FEVER  NAUSEA AND VOMITING THAT IS NOT CONTROLLED WITH YOUR NAUSEA MEDICATION  *UNUSUAL SHORTNESS OF BREATH  *UNUSUAL BRUISING OR BLEEDING  TENDERNESS IN MOUTH AND THROAT WITH OR WITHOUT PRESENCE OF ULCERS  *URINARY PROBLEMS  *BOWEL PROBLEMS  UNUSUAL RASH Items with * indicate a potential emergency and should be followed up as soon as possible. If you have an emergency after office hours please contact your primary care physician or go to the nearest emergency department.  Please call the clinic during office hours if you have any questions or concerns.   You may also contact the Patient Navigator at [phone] should you have any questions or need assistance in obtaining follow up care.      Resources For Cancer Patients and their Caregivers ? American Cancer Society: Can assist with transportation, wigs, general needs, runs Look Good Feel Better.        872-279-4444 ? Cancer Care: Provides financial assistance, online support groups, medication/co-pay assistance.  1-800-813-HOPE (254) 354-9109) ? [facility] Assists [place] cancer patients and their families through emotional , educational and  financial support.  612-236-9147 ? [place] DSS Where to apply for food stamps, Medicaid and utility assistance. 202-251-1619 ? [employer]: Transportation to medical appointments. (231)109-2566 ? Social Security Administration: May apply for disability if have a Stage IV cancer. 331-046-5454 843-577-5974 ? [employer], Disability and Transit Services: Assists with nutrition, care and transit needs. [phone]

## 2015-08-20 LAB — CA 125: CA 125: 40.6 U/mL — AB (ref 0.0–38.1)

## 2015-08-21 ENCOUNTER — Encounter (HOSPITAL_BASED_OUTPATIENT_CLINIC_OR_DEPARTMENT_OTHER): Payer: BLUE CROSS/BLUE SHIELD

## 2015-08-21 VITALS — BP 149/70 | HR 64 | Temp 98.1°F | Resp 18

## 2015-08-21 DIAGNOSIS — Z5189 Encounter for other specified aftercare: Secondary | ICD-10-CM

## 2015-08-21 DIAGNOSIS — C801 Malignant (primary) neoplasm, unspecified: Secondary | ICD-10-CM

## 2015-08-21 MED ORDER — PALONOSETRON HCL INJECTION 0.25 MG/5ML
0.2500 mg | Freq: Once | INTRAVENOUS | Status: AC
  Administered 2015-08-21: 0.25 mg via INTRAVENOUS

## 2015-08-21 MED ORDER — HEPARIN SOD (PORK) LOCK FLUSH 100 UNIT/ML IV SOLN
500.0000 [IU] | Freq: Once | INTRAVENOUS | Status: AC
  Administered 2015-08-21: 500 [IU] via INTRAVENOUS

## 2015-08-21 MED ORDER — METHYLPREDNISOLONE SODIUM SUCC 40 MG IJ SOLR
20.0000 mg | Freq: Once | INTRAMUSCULAR | Status: AC
  Administered 2015-08-21: 20 mg via INTRAVENOUS

## 2015-08-21 MED ORDER — SODIUM CHLORIDE 0.9% FLUSH
20.0000 mL | INTRAVENOUS | Status: DC | PRN
Start: 2015-08-21 — End: 2015-08-21
  Administered 2015-08-21: 20 mL via INTRAVENOUS
  Filled 2015-08-21: qty 20

## 2015-08-21 MED ORDER — HEPARIN SOD (PORK) LOCK FLUSH 100 UNIT/ML IV SOLN
INTRAVENOUS | Status: AC
  Filled 2015-08-21: qty 5

## 2015-08-21 MED ORDER — PALONOSETRON HCL INJECTION 0.25 MG/5ML
INTRAVENOUS | Status: AC
  Filled 2015-08-21: qty 5

## 2015-08-21 MED ORDER — METHYLPREDNISOLONE SODIUM SUCC 40 MG IJ SOLR
INTRAMUSCULAR | Status: AC
  Filled 2015-08-21: qty 1

## 2015-08-21 NOTE — Progress Notes (Signed)
Patient came to clinic today c/o feeling very weak and having nausea without any emesis.  Slight red rash to sternal area, no other area.  MD aware and Solu-Medrol and Aloxi ordered and given.    Patient d/c from home infusion pump when infusion was complete.  Patient stated that she felt much better and was able to ambulate out of the clinic without difficulty.

## 2015-08-21 NOTE — Patient Instructions (Signed)
[facility] Discharge Instructions for Patients Receiving Chemotherapy   Beginning January 23rd 2017 lab work for the [facility] will be done in the  Main lab at [facility] on 1st floor. If you have a lab appointment with the [place] please come in thru the  Main Entrance and check in at the main information desk   Today you received the following chemotherapy agent: Home infusion pump removed. Medications given for rash and nausea.     If you develop nausea and vomiting, or diarrhea that is not controlled by your medication, call the clinic.  The clinic phone number is [phone]. Office hours are Monday-Friday 8:30am-5:00pm.  BELOW ARE SYMPTOMS THAT SHOULD BE REPORTED IMMEDIATELY:  *FEVER GREATER THAN 101.0 F  *CHILLS WITH OR WITHOUT FEVER  NAUSEA AND VOMITING THAT IS NOT CONTROLLED WITH YOUR NAUSEA MEDICATION  *UNUSUAL SHORTNESS OF BREATH  *UNUSUAL BRUISING OR BLEEDING  TENDERNESS IN MOUTH AND THROAT WITH OR WITHOUT PRESENCE OF ULCERS  *URINARY PROBLEMS  *BOWEL PROBLEMS  UNUSUAL RASH Items with * indicate a potential emergency and should be followed up as soon as possible. If you have an emergency after office hours please contact your primary care physician or go to the nearest emergency department.  Please call the clinic during office hours if you have any questions or concerns.   You may also contact the Patient Navigator at [phone] should you have any questions or need assistance in obtaining follow up care.      Resources For Cancer Patients and their Caregivers ? American Cancer Society: Can assist with transportation, wigs, general needs, runs Look Good Feel Better.        (567)089-3677 ? Cancer Care: Provides financial assistance, online support groups, medication/co-pay assistance.  1-800-813-HOPE (856) 343-6205) ? [facility] Assists [place] cancer patients and their families through emotional ,  educational and financial support.  779-193-5880 ? [place] DSS Where to apply for food stamps, Medicaid and utility assistance. (504) 061-1442 ? [employer]: Transportation to medical appointments. 9077288826 ? Social Security Administration: May apply for disability if have a Stage IV cancer. 417-033-2002 (443) 026-9994 ? [employer], Disability and Transit Services: Assists with nutrition, care and transit needs. 727-255-3388

## 2015-08-28 ENCOUNTER — Telehealth (HOSPITAL_COMMUNITY): Payer: Self-pay | Admitting: *Deleted

## 2015-08-28 NOTE — Telephone Encounter (Signed)
Pt states that she is staying nauseated. I asked that patient begin to take nausea medication before eating since eating causes nausea. She said ok.

## 2015-08-31 NOTE — Assessment & Plan Note (Addendum)
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 [name] on 06/26/2015 after findings a large abdominopelvic mass by Gynecologist, Dr. Evie Lacks. It is dictated that the patient had a pre-operative elevation of CA 125 and CEA.  Began systemic chemotherapy in the adjuvant setting on 07/22/2015 consisting of FOLFOX, complicated by allergic reaction to Oxaliplatin on cycle #2, resulting in the need for oxaliplatin desensitization.  Oncology history is updated.  Labs today: CBC diff, CMET  She experiences some pruritus surrounding her Port-A-Cath and I suspect this is secondary to the Tegaderm we've been utilizing during treatment.  She can utilize hydrocortisone or other ointment/creams for symptom management.  Letter is provided today to allow her to return to work on a when necessary basis. This letter can be viewed in the letter tab of chart review.  Return in 2 weeks for follow-up, pretreatment labs, and follow-up appointment.  Today is her second cycle of the oxaliplatin desensitization protocol. I will discuss with Dr. Whitney Muse and we may revert back to the protocol for future FOLFOX.  ADDENDUM: I discussed the patient's future treatment plan with Dr. Whitney Muse. She agrees that we will transition the patient back to regular dosing for FOLFOX after completing 2 cycles of desensitization protocol for oxaliplatin. New treatment plan felt for appropriate cycles of chemotherapy that are left out of 12 cycles in total. Dosing is adjusted accordingly according to previous cycles.

## 2015-08-31 NOTE — Progress Notes (Signed)
[facility] MD [place] [address]  Mucinous adenocarcinoma [name]) - Plan: DISCONTINUED: oxaliplatin (ELOXATIN) 0.014 mg in dextrose 5 % 100 mL chemo infusion, DISCONTINUED: oxaliplatin (ELOXATIN) 0.14 mg in dextrose 5 % 100 mL chemo infusion, DISCONTINUED: oxaliplatin (ELOXATIN) 1.4 mg in dextrose 5 % 100 mL chemo infusion, DISCONTINUED: oxaliplatin (ELOXATIN) 126 mg in dextrose 5 % 500 mL chemo infusion, DISCONTINUED: oxaliplatin (ELOXATIN) 14 mg in dextrose 5 % 100 mL chemo infusion, DISCONTINUED: fluorouracil (ADRUCIL) 3,550 mg in sodium chloride 0.9 % 79 mL chemo infusion  CURRENT THERAPY: FOLFOX beginning on 07/22/2015  INTERVAL HISTORY: Natalie Ball 65 y.o. 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 [name] on 06/26/2015 after findings a large abdominopelvic mass by Gynecologist, Dr. Evie Lacks. It is dictated that the patient had a pre-operative elevation of CA 125 and CEA. Began systemic chemotherapy in the adjuvant setting on 07/22/2015.    Mucinous adenocarcinoma ([place])   06/24/2015 Imaging CT CAP- Complex cystic central pelvic mass remains suspicious for cystic ovarian carcinoma, most likely arising from the right ovary. Prominent right gonadal vasculature. Tiny hypodensity in the dome of the liver is too small to characterize, but stable.   06/26/2015 Pathology Results Adnexa - ovary +/- tube, neoplastic, right - MUCINOUS ADENOCARCINOMA, 13 CM. - OVARIAN SURFACE NOT INVOLVED BY TUMOR. - UNREMARKABLE FALLOPIAN TUBE WITH NO ENDOMETRIOSIS OR MALIGNANCY.   06/26/2015 Procedure Exploratory laparotomy with total hysterectomy bilateral salpingo-oophorectomy, pelvic and para-aortic lymph node dissection, omentectomy washings and biopsies for ovarian cancer by Dr. [name]   06/27/2015 Pathology Results PERITONEAL WASHING(SPECIMEN 1 OF 1  COLLECTED 06/27/15): ATYPICAL CELLS. Immunohistochemistry calretinin, cytokeratin 5/6, estrogen receptor, progesterone receptor and WT-1 is performed. The morphology and immunophenotype favor reactive mesothelial cells   07/10/2015 Initial Diagnosis Mucinous adenocarcinoma ([place])   07/22/2015 -  Chemotherapy FOLFOX   07/24/2015 Survivorship Genetic Counseling consultation with [name].   07/26/2015 Adverse Reaction Loose stools, 2-3 per 24 hours   07/29/2015 Treatment Plan Change 5FU bolus discontinued due to loose stools.   08/05/2015 Adverse Reaction Reaction to Oxaliplatin   09/01/2015 Treatment Plan Change Decrease 5FU CI by 10%    I personally reviewed and went over laboratory results with the patient.  The results are noted within this dictation.  Her platelet count meets treatment parameters today but is noted to be 107,000. Her liver enzymes have increased slightly but are not 3 times the upper limits of normal. She meets treatment parameters today. We will will reduce her 5-FU continuous infusion by 10%.  She has a number of questions and these were all addressed today: #1. She reports that her port site is itching circumferentially around the port itself. I wonder if this is secondary to Tegaderm. She's been using some sort of oil at home is been helpful. I've also recommended hydrocortisone for symptom management if necessary avoiding application to the Port-A-Cath itself. #2. When will be my next scans? She is educated that given her stage and disease type, imaging during treatment is not indicated without any signs or symptoms that are concerning. #3. She asks if she can take silica. This should not interfere with chemotherapy and she is free to do so. #4. She received a TB injection yesterday. Her blood counts from a white blood cell count standpoint is adequate. She's free for treatment today. #5. She is allowed to have  one alcoholic beverage per week. She notes that she enjoys Nurse, learning disability.  She is encouraged to limit her alcohol abuse to one per week, particularly in light of her AST/ALT elevation of late. #6. She wants to go back to work. 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 She will have the liberty of choosing her days and times to work and therefore she'll be able to avoid working on days where she is experiencing side effects of chemotherapy.  She notes minimal numbness and tingling of her fingertips and toetips the nearly resolved prior to subsequent cycle of chemotherapy. She also notes cold intolerance as expected secondary to oxaliplatin. We will monitor her for progressive peripheral neuropathy moving forward.  Past Medical History  Diagnosis Date  . Allergy   . GERD (gastroesophageal reflux disease)   . Complication of anesthesia   . PONV (postoperative nausea and vomiting)   . Wears glasses   . Tinnitus   . Vertigo   . Pneumonia   . History of bronchitis   . Urinary urgency   . Headache   . Mucinous adenocarcinoma ([place]) 07/10/2015  . Family history of ovarian cancer   . Family history of pancreatic cancer   . Family history of colon cancer     has Mucinous adenocarcinoma ([place]); Family history of ovarian cancer; Family history of pancreatic cancer; Family history of colon cancer; and Genetic testing on her problem list.     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  Current Outpatient Prescriptions on File Prior to Visit  Medication Sig Dispense Refill  . acetaminophen (TYLENOL) 500 MG tablet Take 500 mg by mouth every 6 (six) hours as needed.    . bisacodyl (DULCOLAX) 10 MG suppository Place 10 mg rectally as needed for moderate constipation. Reported on 07/29/2015    . dextrose 5 % SOLN 1,000 mL with fluorouracil 5 GM/100ML SOLN Inject into the vein. To infuse over 46 hours every 14 days    . docusate sodium (COLACE) 100 MG capsule Take 200 mg by mouth daily. Reported on 08/19/2015    . famotidine (PEPCID) 20 MG tablet Take 20 mg by mouth every morning.    . fluticasone (FLONASE)  50 MCG/ACT nasal spray Place 1 spray into both nostrils daily.    Marland Kitchen ibuprofen (ADVIL,MOTRIN) 200 MG tablet Take 400 mg by mouth every 6 (six) hours as needed for headache or moderate pain. Reported on 07/14/2015    . leucovorin in dextrose 5 % 250 mL Inject into the vein once. Every 14 days    . lidocaine-prilocaine (EMLA) cream Apply a quarter size amount to port site 1 hour prior to chemo. Do not rub in. Cover with plastic wrap. 30 g 3  . loperamide (IMODIUM) 2 MG capsule Take 2 mg by mouth as needed for diarrhea or loose stools. Reported on 08/19/2015    . loratadine (CLARITIN) 10 MG tablet Take 10 mg by mouth daily as needed for allergies.    Marland Kitchen nystatin (MYCOSTATIN) 100000 UNIT/ML suspension Reported on 08/19/2015  0  . ondansetron (ZOFRAN) 8 MG tablet Take 1 tablet (8 mg total) by mouth every 8 (eight) hours as needed for nausea or vomiting. 30 tablet 3  . OXALIPLATIN IV Inject into the vein. Every 14 days    . oxyCODONE-acetaminophen (PERCOCET/ROXICET) 5-325 MG tablet Take 1 tablet by mouth every 6 (six) hours as needed for severe pain. Reported on 08/19/2015    . Papaya Enzyme CHEW Chew 1 tablet by mouth daily. For digestive health    .  prochlorperazine (COMPAZINE) 10 MG tablet Take 1 tablet (10 mg total) by mouth every 6 (six) hours as needed for nausea or vomiting. 30 tablet 3  . HYDROcodone-acetaminophen (NORCO/VICODIN) 5-325 MG tablet Take 1 tablet by mouth every 4 (four) hours as needed for moderate pain. (Patient not taking: Reported on 08/19/2015) 30 tablet 0  . magic mouthwash w/lidocaine SOLN Swish and swallow 1 teaspoonful four times a day. (Patient not taking: Reported on 08/19/2015) 360 mL 1   Current Facility-Administered Medications on File Prior to Visit  Medication Dose Route Frequency Provider Last Rate Last Dose  . fluorouracil (ADRUCIL) 3,550 mg in sodium chloride 0.9 % 79 mL chemo infusion  2,160 mg/m2 (Treatment Plan Actual) Intravenous 1 day or 1 dose Patrici Ranks, MD      .  leucovorin 656 mg in dextrose 5 % 250 mL infusion  400 mg/m2 (Treatment Plan Actual) Intravenous Once Patrici Ranks, MD      . oxaliplatin (ELOXATIN) 0.014 mg in dextrose 5 % 100 mL chemo infusion  0.014 mg Intravenous Once Patrici Ranks, MD 100 mL/hr at 09/02/15 0916 0.014 mg at 09/02/15 0916  . oxaliplatin (ELOXATIN) 0.14 mg in dextrose 5 % 100 mL chemo infusion  0.14 mg Intravenous Once Patrici Ranks, MD      . oxaliplatin (ELOXATIN) 1.4 mg in dextrose 5 % 100 mL chemo infusion  1.4 mg Intravenous Once Patrici Ranks, MD      . oxaliplatin (ELOXATIN) 126 mg in dextrose 5 % 500 mL chemo infusion  126 mg Intravenous Once Patrici Ranks, MD      . oxaliplatin (ELOXATIN) 14 mg in dextrose 5 % 100 mL chemo infusion  14 mg Intravenous Once Patrici Ranks, MD      . sodium chloride flush (NS) 0.9 % injection 10 mL  10 mL Intracatheter PRN Patrici Ranks, MD   10 mL at 09/02/15 [id number]    Past Surgical History  Procedure Laterality Date  . Tubal ligation  1974  . Diagnostic laparoscopy  1977  . Rectal fusula  1980  . Appendectomy    . Laparotomy N/A 06/26/2015    Procedure: EXPLORATORY LAPAROTOMY;  Surgeon: [name], MD;  Location: [facility];  Service: Gynecology;  Laterality: N/A;  . Abdominal hysterectomy N/A 06/26/2015    Procedure: TOTAL HYSTERECTOMY ABDOMINAL;  Surgeon: [name], MD;  Location: [facility];  Service: Gynecology;  Laterality: N/A;  . Salpingoophorectomy Bilateral 06/26/2015    Procedure: SALPINGO OOPHORECTOMY;  Surgeon: [name], MD;  Location: [facility];  Service: Gynecology;  Laterality: Bilateral;  . Omentectomy N/A 06/26/2015    Procedure:  OMENTECTOMY;  Surgeon: [name], MD;  Location: [facility];  Service: Gynecology;  Laterality: N/A;  . Debulking N/A 06/26/2015    Procedure: bilateral periaortic lymph node dissection biopsies and peritoneal washings;  Surgeon: [name], MD;  Location: [facility];  Service: Gynecology;  Laterality: N/A;  .  Portacath placement Left 07/18/2015    Procedure: INSERTION PORT-A-CATH;  Surgeon: Aviva Signs, MD;  Location: [facility];  Service: General;  Laterality: Left;  pt knows to arrive at 6:15    Denies any headaches, dizziness, double vision, fevers, chills, night sweats, nausea, vomiting, diarrhea, constipation, chest pain, heart palpitations, shortness of breath, blood in stool, black tarry stool, urinary pain, urinary burning, urinary frequency, hematuria.   PHYSICAL EXAMINATION  ECOG PERFORMANCE STATUS: 0 - Asymptomatic  Filed Vitals:   09/02/15 0833  BP: 152/81  Pulse: 64  Temp: 98.3 F (36.8 C)  Resp: 18    GENERAL:alert, no distress, well nourished, well developed, comfortable, cooperative, smiling and accompanied by her boyfriend, in chemotherapy recliner SKIN: skin color, texture, turgor are normal, mild erythema surrounding Port-A-Cath mainly in the superior portion without any open areas or indication of infection. Has the appearance of contact dermatitis. HEAD: Normocephalic, No masses, lesions, tenderness or abnormalities EYES: normal, EOMI, Conjunctiva are pink and non-injected EARS: External ears normal OROPHARYNX:lips, buccal mucosa, and tongue normal and mucous membranes are moist  NECK: supple, trachea midline LYMPH:  no palpable lymphadenopathy BREAST:not examined LUNGS: clear to auscultation  HEART: regular rate & rhythm, no murmurs, no gallops, S1 normal and S2 normal ABDOMEN:abdomen soft, non-tender and normal bowel sounds BACK: Back symmetric, no curvature. EXTREMITIES:less then 2 second capillary refill, no joint deformities, effusion, or inflammation, no skin discoloration, no cyanosis  NEURO: alert & oriented x 3 with fluent speech, no focal motor/sensory deficits   LABORATORY DATA: CBC    Component Value Date/Time   WBC 6.4 09/01/2015 0947   RBC 4.15 09/01/2015 0947   HGB 13.1 09/01/2015 0947   HCT 39.3 09/01/2015 0947   PLT 117* 09/01/2015 0947   MCV  94.7 09/01/2015 0947   MCH 31.6 09/01/2015 0947   MCHC 33.3 09/01/2015 0947   RDW 14.3 09/01/2015 0947   LYMPHSABS 1.4 09/01/2015 0947   MONOABS 0.7 09/01/2015 0947   EOSABS 0.3 09/01/2015 0947   BASOSABS 0.0 09/01/2015 0947      Chemistry      Component Value Date/Time   NA 141 09/01/2015 0947   NA 141 06/20/2015 1121   K 3.9 09/01/2015 0947   K 4.1 06/20/2015 1121   CL 107 09/01/2015 0947   CO2 24 09/01/2015 0947   CO2 24 06/20/2015 1121   BUN 16 09/01/2015 0947   BUN 14.7 06/20/2015 1121   CREATININE 0.63 09/01/2015 0947   CREATININE 0.8 06/20/2015 1121      Component Value Date/Time   CALCIUM 9.5 09/01/2015 0947   CALCIUM 9.2 06/20/2015 1121   ALKPHOS 70 09/01/2015 0947   AST 68* 09/01/2015 0947   ALT 106* 09/01/2015 0947   BILITOT 0.5 09/01/2015 0947     Lab Results  Component Value Date   CEA 1.4 07/10/2015   Lab Results  Component Value Date   CA125 40.6* 08/19/2015     PENDING LABS:   RADIOGRAPHIC STUDIES:  No results found.   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 [name] on 06/26/2015 after findings a large abdominopelvic mass by Gynecologist, Dr. Evie Lacks. It is dictated that the patient had a pre-operative elevation of CA 125 and CEA.  Began systemic chemotherapy in the adjuvant setting on 07/22/2015 consisting of FOLFOX, complicated by allergic reaction to Oxaliplatin on cycle #2, resulting in the need for oxaliplatin desensitization.  Oncology history is updated.  Labs today: CBC diff, CMET  She experiences some pruritus surrounding her Port-A-Cath and I suspect this is secondary to the Tegaderm we've been utilizing during treatment.  She can utilize hydrocortisone or other ointment/creams for symptom management.  Letter is provided  today to allow her to return to work on a when necessary basis. This letter can be viewed in the letter tab of chart review.  Return in 2 weeks for follow-up, pretreatment labs, and follow-up appointment.  Today is her second cycle of the oxaliplatin desensitization protocol. I will discuss with Dr. Whitney Muse and we may revert back to the protocol for future FOLFOX.    THERAPY PLAN:  Complete adjuvant FOLFOX, followed by a colonoscopy by Dr. Britta Mccreedy (GI) in [place].  All questions were answered. The patient knows to call the clinic with any problems, questions or concerns. We can certainly see the patient much sooner if necessary.  Patient and plan discussed with Dr. Ancil Linsey and she is in agreement with the aforementioned.   This note is electronically signed by: Doy Mince 09/02/2015 9:23 AM

## 2015-09-01 ENCOUNTER — Other Ambulatory Visit (HOSPITAL_COMMUNITY): Payer: Self-pay | Admitting: Oncology

## 2015-09-01 ENCOUNTER — Encounter (HOSPITAL_COMMUNITY): Payer: BLUE CROSS/BLUE SHIELD

## 2015-09-01 DIAGNOSIS — C801 Malignant (primary) neoplasm, unspecified: Secondary | ICD-10-CM | POA: Diagnosis not present

## 2015-09-01 LAB — COMPREHENSIVE METABOLIC PANEL
ALK PHOS: 70 U/L (ref 38–126)
ALT: 106 U/L — AB (ref 14–54)
AST: 68 U/L — AB (ref 15–41)
Albumin: 4.2 g/dL (ref 3.5–5.0)
Anion gap: 10 (ref 5–15)
BUN: 16 mg/dL (ref 6–20)
CALCIUM: 9.5 mg/dL (ref 8.9–10.3)
CHLORIDE: 107 mmol/L (ref 101–111)
CO2: 24 mmol/L (ref 22–32)
CREATININE: 0.63 mg/dL (ref 0.44–1.00)
GFR calc non Af Amer: >60 mL/min (ref >60)
Glucose, Bld: 115 mg/dL — ABNORMAL HIGH (ref 65–99)
Potassium: 3.9 mmol/L (ref 3.5–5.1)
SODIUM: 141 mmol/L (ref 135–145)
Total Bilirubin: 0.5 mg/dL (ref 0.3–1.2)
Total Protein: 7.1 g/dL (ref 6.5–8.1)

## 2015-09-01 LAB — CBC WITH DIFFERENTIAL/PLATELET
BASOS PCT: 1 %
Basophils Absolute: 0 10*3/uL (ref 0.0–0.1)
EOS ABS: 0.3 10*3/uL (ref 0.0–0.7)
EOS PCT: 4 %
HCT: 39.3 % (ref 36.0–46.0)
HEMOGLOBIN: 13.1 g/dL (ref 12.0–15.0)
Lymphocytes Relative: 22 %
Lymphs Abs: 1.4 10*3/uL (ref 0.7–4.0)
MCH: 31.6 pg (ref 26.0–34.0)
MCHC: 33.3 g/dL (ref 30.0–36.0)
MCV: 94.7 fL (ref 78.0–100.0)
MONOS PCT: 12 %
Monocytes Absolute: 0.7 10*3/uL (ref 0.1–1.0)
NEUTROS PCT: 62 %
Neutro Abs: 3.9 10*3/uL (ref 1.7–7.7)
PLATELETS: 117 10*3/uL — AB (ref 150–400)
RBC: 4.15 MIL/uL (ref 3.87–5.11)
RDW: 14.3 % (ref 11.5–15.5)
WBC: 6.4 10*3/uL (ref 4.0–10.5)

## 2015-09-02 ENCOUNTER — Encounter (HOSPITAL_COMMUNITY): Payer: Self-pay | Admitting: Oncology

## 2015-09-02 ENCOUNTER — Encounter (HOSPITAL_BASED_OUTPATIENT_CLINIC_OR_DEPARTMENT_OTHER): Payer: BLUE CROSS/BLUE SHIELD

## 2015-09-02 ENCOUNTER — Ambulatory Visit (HOSPITAL_COMMUNITY): Payer: BLUE CROSS/BLUE SHIELD | Admitting: Hematology & Oncology

## 2015-09-02 ENCOUNTER — Encounter (HOSPITAL_BASED_OUTPATIENT_CLINIC_OR_DEPARTMENT_OTHER): Payer: BLUE CROSS/BLUE SHIELD | Admitting: Oncology

## 2015-09-02 VITALS — BP 160/89 | HR 68 | Temp 97.9°F | Resp 16

## 2015-09-02 VITALS — BP 152/81 | HR 64 | Temp 98.3°F | Resp 18 | Wt 127.0 lb

## 2015-09-02 DIAGNOSIS — C801 Malignant (primary) neoplasm, unspecified: Secondary | ICD-10-CM | POA: Diagnosis not present

## 2015-09-02 DIAGNOSIS — Z5111 Encounter for antineoplastic chemotherapy: Secondary | ICD-10-CM

## 2015-09-02 MED ORDER — OXALIPLATIN CHEMO INJECTION FOR DESENSITIZATION 100 MG/20ML
126.0000 mg | Freq: Once | INTRAVENOUS | Status: AC
  Administered 2015-09-02: 126 mg via INTRAVENOUS
  Filled 2015-09-02: qty 8

## 2015-09-02 MED ORDER — SODIUM CHLORIDE 0.9% FLUSH
10.0000 mL | INTRAVENOUS | Status: DC | PRN
  Administered 2015-09-02: 10 mL
  Filled 2015-09-02: qty 10

## 2015-09-02 MED ORDER — OXALIPLATIN CHEMO INJECTION FOR DESENSITIZATION 100 MG/20ML
1.4000 mg | Freq: Once | INTRAVENOUS | Status: AC
  Administered 2015-09-02: 1.4 mg via INTRAVENOUS
  Filled 2015-09-02: qty 0.28

## 2015-09-02 MED ORDER — DEXTROSE 5 % IV SOLN
14.0000 mg | Freq: Once | INTRAVENOUS | Status: AC
  Administered 2015-09-02: 14 mg via INTRAVENOUS
  Filled 2015-09-02: qty 2.8

## 2015-09-02 MED ORDER — OXALIPLATIN CHEMO INJECTION FOR DESENSITIZATION 100 MG/20ML
0.0140 mg | Freq: Once | INTRAVENOUS | Status: AC
  Administered 2015-09-02: 0.014 mg via INTRAVENOUS
  Filled 2015-09-02: qty 0

## 2015-09-02 MED ORDER — LEUCOVORIN CALCIUM INJECTION 350 MG
400.0000 mg/m2 | Freq: Once | INTRAVENOUS | Status: AC
  Administered 2015-09-02: 656 mg via INTRAVENOUS
  Filled 2015-09-02: qty 32.8

## 2015-09-02 MED ORDER — DEXTROSE 5 % IV SOLN
Freq: Once | INTRAVENOUS | Status: AC
  Administered 2015-09-02: 09:00:00 via INTRAVENOUS

## 2015-09-02 MED ORDER — LORAZEPAM 2 MG/ML IJ SOLN
INTRAMUSCULAR | Status: AC
  Filled 2015-09-02: qty 1

## 2015-09-02 MED ORDER — PALONOSETRON HCL INJECTION 0.25 MG/5ML
0.2500 mg | Freq: Once | INTRAVENOUS | Status: AC
Start: 2015-09-02 — End: 2015-09-02
  Administered 2015-09-02: 0.25 mg via INTRAVENOUS

## 2015-09-02 MED ORDER — SODIUM CHLORIDE 0.9 % IV SOLN
Freq: Once | INTRAVENOUS | Status: AC
  Administered 2015-09-02: 09:00:00 via INTRAVENOUS
  Filled 2015-09-02: qty 5

## 2015-09-02 MED ORDER — OXALIPLATIN CHEMO INJECTION FOR DESENSITIZATION 100 MG/20ML
0.1400 mg | Freq: Once | INTRAVENOUS | Status: AC
  Administered 2015-09-02: 0.14 mg via INTRAVENOUS
  Filled 2015-09-02: qty 0.03

## 2015-09-02 MED ORDER — PALONOSETRON HCL INJECTION 0.25 MG/5ML
INTRAVENOUS | Status: AC
  Filled 2015-09-02: qty 5

## 2015-09-02 MED ORDER — OXALIPLATIN CHEMO INJECTION FOR DESENSITIZATION 100 MG/20ML: 126.0000 mg | Freq: Once | INTRAVENOUS | Status: DC

## 2015-09-02 MED ORDER — OXALIPLATIN CHEMO INJECTION FOR DESENSITIZATION 100 MG/20ML: 1.4000 mg | Freq: Once | INTRAVENOUS | Status: DC

## 2015-09-02 MED ORDER — SODIUM CHLORIDE 0.9 % IV SOLN: 2160.0000 mg/m2 | INTRAVENOUS | Status: DC

## 2015-09-02 MED ORDER — OXALIPLATIN CHEMO INJECTION FOR DESENSITIZATION 100 MG/20ML: 0.1400 mg | Freq: Once | INTRAVENOUS | Status: DC

## 2015-09-02 MED ORDER — LORAZEPAM 2 MG/ML IJ SOLN
0.5000 mg | Freq: Once | INTRAMUSCULAR | Status: AC
  Administered 2015-09-02: 0.5 mg via INTRAVENOUS

## 2015-09-02 MED ORDER — OXALIPLATIN CHEMO INJECTION FOR DESENSITIZATION 100 MG/20ML: 14.0000 mg | Freq: Once | INTRAVENOUS | Status: DC

## 2015-09-02 MED ORDER — OXALIPLATIN CHEMO INJECTION FOR DESENSITIZATION 100 MG/20ML: 0.0140 mg | Freq: Once | INTRAVENOUS | Status: DC

## 2015-09-02 MED ORDER — SODIUM CHLORIDE 0.9 % IV SOLN
2160.0000 mg/m2 | INTRAVENOUS | Status: DC
  Administered 2015-09-02: 3550 mg via INTRAVENOUS
  Filled 2015-09-02: qty 71

## 2015-09-02 NOTE — Patient Instructions (Signed)
[facility] Discharge Instructions for Patients Receiving Chemotherapy   Beginning January 23rd 2017 lab work for the [facility] will be done in the  Main lab at [facility] on 1st floor. If you have a lab appointment with the [facility] please come in thru the  Main Entrance and check in at the main information desk   Today you received the following chemotherapy agents Oxaliplatin, leucovorin and 27fu continuous pump.  To help prevent nausea and vomiting after your treatment, we encourage you to take your nausea medication as instructed. If you develop nausea and vomiting, or diarrhea that is not controlled by your medication, call the clinic.  The clinic phone number is [phone]. Office hours are Monday-Friday 8:30am-5:00pm.  BELOW ARE SYMPTOMS THAT SHOULD BE REPORTED IMMEDIATELY:  *FEVER GREATER THAN 101.0 F  *CHILLS WITH OR WITHOUT FEVER  NAUSEA AND VOMITING THAT IS NOT CONTROLLED WITH YOUR NAUSEA MEDICATION  *UNUSUAL SHORTNESS OF BREATH  *UNUSUAL BRUISING OR BLEEDING  TENDERNESS IN MOUTH AND THROAT WITH OR WITHOUT PRESENCE OF ULCERS  *URINARY PROBLEMS  *BOWEL PROBLEMS  UNUSUAL RASH Items with * indicate a potential emergency and should be followed up as soon as possible. If you have an emergency after office hours please contact your primary care physician or go to the nearest emergency department.  Please call the clinic during office hours if you have any questions or concerns.   You may also contact the Patient Navigator at [phone] should you have any questions or need assistance in obtaining follow up care.  Resources For Cancer Patients and their Caregivers ? American Cancer Society: Can assist with transportation, wigs, general needs, runs Look Good Feel Better.        630-823-3201 ? Cancer Care: Provides financial assistance, online support groups, medication/co-pay assistance.  1-800-813-HOPE 318-853-1739) ? [place] Assists [place] cancer patients and their families through emotional , educational and financial support.  6198102601 ? [place] DSS Where to apply for food stamps, Medicaid and utility assistance. 380-017-2646 ? RCATS: Transportation to medical appointments. 310-854-1071 ? Social Security Administration: May apply for disability if have a Stage IV cancer. [phone] 240-753-7053 ? [employer], Disability and Transit Services: Assists with nutrition, care and transit needs. [phone]

## 2015-09-02 NOTE — Progress Notes (Signed)
Tolerated chemo well. Stable on discharge home with friend via wheelchair.

## 2015-09-02 NOTE — Patient Instructions (Addendum)
[place] at [facility] Discharge Instructions  RECOMMENDATIONS MADE BY THE CONSULTANT AND ANY TEST RESULTS WILL BE SENT TO YOUR REFERRING PHYSICIAN.  Exam done today and seen by Kirby Crigler Scans will be done at end of treatment. Alcohol is fine, your med that you want to take is fine. You may return to work. Will give you a letter. Try hydrocortisone cream around outside of port for itching. Continue your oil if you would like. Blood counts look good.Platelets are a little low but ok. We reduced dose of 90fu by 10%. Try Senakot for your constipation.  Return in 2 weeks for follow up and pretreatment labs.  Thank you for choosing [place] at [facility] to provide your oncology and hematology care.  To afford each patient quality time with our provider, please arrive at least 15 minutes before your scheduled appointment time.   Beginning January 23rd 2017 lab work for the [facility] will be done in the  Main lab at [facility] on 1st floor. If you have a lab appointment with the [facility] [place] please come in thru the  Main Entrance and check in at the main information desk  You need to re-schedule your appointment should you arrive 10 or more minutes late.  We strive to give you quality time with our providers, and arriving late affects you and other patients whose appointments are after yours.  Also, if you no show three or more times for appointments you may be dismissed from the clinic at the providers discretion.     Again, thank you for choosing [facility].  Our hope is that these requests will decrease the amount of time that you wait before being seen by our physicians.       _____________________________________________________________  Should you have questions after your visit to [facility], please contact our office at [phone] between the hours of 8:30 a.m. and 4:30 p.m.  Voicemails left after  4:30 p.m. will not be returned until the following business day.  For prescription refill requests, have your pharmacy contact our office.         Resources For Cancer Patients and their Caregivers ? American Cancer Society: Can assist with transportation, wigs, general needs, runs Look Good Feel Better.        518-323-0460 ? Cancer Care: Provides financial assistance, online support groups, medication/co-pay assistance.  1-800-813-HOPE 912-343-1755) ? [place] Assists [place] cancer patients and their families through emotional , educational and financial support.  8027132213 ? [place] DSS Where to apply for food stamps, Medicaid and utility assistance. 845-404-3007 ? RCATS: Transportation to medical appointments. 330-051-9908 ? Social Security Administration: May apply for disability if have a Stage IV cancer. 916-602-9822 705-690-8258 ? [employer], Disability and Transit Services: Assists with nutrition, care and transit needs. [phone]

## 2015-09-04 ENCOUNTER — Other Ambulatory Visit (HOSPITAL_COMMUNITY): Payer: Self-pay | Admitting: Oncology

## 2015-09-04 ENCOUNTER — Encounter (HOSPITAL_BASED_OUTPATIENT_CLINIC_OR_DEPARTMENT_OTHER): Payer: BLUE CROSS/BLUE SHIELD

## 2015-09-04 VITALS — BP 129/66 | HR 63 | Temp 98.3°F | Resp 16

## 2015-09-04 DIAGNOSIS — Z95828 Presence of other vascular implants and grafts: Secondary | ICD-10-CM

## 2015-09-04 DIAGNOSIS — Z5189 Encounter for other specified aftercare: Secondary | ICD-10-CM

## 2015-09-04 DIAGNOSIS — R110 Nausea: Secondary | ICD-10-CM

## 2015-09-04 DIAGNOSIS — C801 Malignant (primary) neoplasm, unspecified: Secondary | ICD-10-CM

## 2015-09-04 MED ORDER — PROCHLORPERAZINE EDISYLATE 5 MG/ML IJ SOLN
10.0000 mg | Freq: Once | INTRAMUSCULAR | Status: AC
  Filled 2015-09-04: qty 2

## 2015-09-04 MED ORDER — OMEPRAZOLE 20 MG PO CPDR: 20.0000 mg | DELAYED_RELEASE_CAPSULE | Freq: Every day | ORAL | Status: DC

## 2015-09-04 MED ORDER — SODIUM CHLORIDE 0.9 % IV SOLN
8.0000 mg | Freq: Once | INTRAVENOUS | Status: AC
  Filled 2015-09-04: qty 0.8

## 2015-09-04 MED ORDER — SODIUM CHLORIDE 0.9% FLUSH
10.0000 mL | Freq: Once | INTRAVENOUS | Status: AC
  Administered 2015-09-04: 10 mL via INTRAVENOUS

## 2015-09-04 MED ORDER — HEPARIN SOD (PORK) LOCK FLUSH 100 UNIT/ML IV SOLN
500.0000 [IU] | Freq: Once | INTRAVENOUS | Status: AC
  Administered 2015-09-04: 500 [IU] via INTRAVENOUS

## 2015-09-04 MED ORDER — SODIUM CHLORIDE 0.9 % IV SOLN
8.0000 mg | Freq: Once | INTRAVENOUS | Status: AC
  Administered 2015-09-04: 8 mg via INTRAVENOUS

## 2015-09-04 MED ORDER — LORAZEPAM 0.5 MG PO TABS: 0.5000 mg | ORAL_TABLET | Freq: Three times a day (TID) | ORAL | Status: DC

## 2015-09-04 MED ORDER — HEPARIN SOD (PORK) LOCK FLUSH 100 UNIT/ML IV SOLN
INTRAVENOUS | Status: AC
  Filled 2015-09-04: qty 5

## 2015-09-04 MED ORDER — PROCHLORPERAZINE EDISYLATE 5 MG/ML IJ SOLN
10.0000 mg | Freq: Four times a day (QID) | INTRAMUSCULAR | Status: DC | PRN
  Administered 2015-09-04: 10 mg via INTRAVENOUS
  Filled 2015-09-04: qty 2

## 2015-09-04 NOTE — Patient Instructions (Signed)
[place] at [facility] Discharge Instructions  RECOMMENDATIONS MADE BY THE CONSULTANT AND ANY TEST RESULTS WILL BE SENT TO YOUR REFERRING PHYSICIAN.  Disconnected infusion pump. You received compazine and dexamethasone IV for nausea. You were given a Caryle Helgeson RX for omeprazole. Take as directed. STOP famotidine that you are currently taking (omeprazole replaces this medication). Return as scheduled.   Thank you for choosing [place] at [facility] to provide your oncology and hematology care.  To afford each patient quality time with our provider, please arrive at least 15 minutes before your scheduled appointment time.   Beginning January 23rd 2017 lab work for the [facility] will be done in the  Main lab at [facility] on 1st floor. If you have a lab appointment with the [facility] please come in thru the  Main Entrance and check in at the main information desk  You need to re-schedule your appointment should you arrive 10 or more minutes late.  We strive to give you quality time with our providers, and arriving late affects you and other patients whose appointments are after yours.  Also, if you no show three or more times for appointments you may be dismissed from the clinic at the providers discretion.     Again, thank you for choosing [facility].  Our hope is that these requests will decrease the amount of time that you wait before being seen by our physicians.       _____________________________________________________________  Should you have questions after your visit to [facility], please contact our office at [phone] between the hours of 8:30 a.m. and 4:30 p.m.  Voicemails left after 4:30 p.m. will not be returned until the following business day.  For prescription refill requests, have your pharmacy contact our office.         Resources For Cancer Patients and their Caregivers ? American  Cancer Society: Can assist with transportation, wigs, general needs, runs Look Good Feel Better.        256-830-0902 ? Cancer Care: Provides financial assistance, online support groups, medication/co-pay assistance.  1-800-813-HOPE (651)081-2596) ? [place] Assists [place] cancer patients and their families through emotional , educational and financial support.  (534)377-8251 ? [place] DSS Where to apply for food stamps, Medicaid and utility assistance. (986)610-6541 ? RCATS: Transportation to medical appointments. [phone] ? Social Security Administration: May apply for disability if have a Stage IV cancer. ([phone] 5203290480 ? [employer], Disability and Transit Services: Assists with nutrition, care and transit needs. [phone]

## 2015-09-04 NOTE — Progress Notes (Signed)
Patient reports she is so nauseated today that she wished she would die. Patient reports relief from nausea after ativan on Tuesday when here during treatment. Patient reports she drove herself to clinic today. Reports ativan "knocked" her out on Tuesday. Above reported to MD. Continuous infusion pump complete. Disconnected infusion pump, flushed port with saline.Jamen Loiseau orders received and administered. Flushed port per protocol and needle removed.

## 2015-09-16 ENCOUNTER — Encounter (HOSPITAL_COMMUNITY): Payer: BLUE CROSS/BLUE SHIELD | Attending: Hematology & Oncology

## 2015-09-16 ENCOUNTER — Encounter (HOSPITAL_BASED_OUTPATIENT_CLINIC_OR_DEPARTMENT_OTHER): Payer: BLUE CROSS/BLUE SHIELD | Admitting: Hematology & Oncology

## 2015-09-16 ENCOUNTER — Encounter (HOSPITAL_COMMUNITY): Payer: Self-pay | Admitting: Hematology & Oncology

## 2015-09-16 VITALS — BP 148/90 | HR 72 | Temp 97.6°F | Resp 18 | Wt 127.4 lb

## 2015-09-16 VITALS — BP 119/61 | HR 66 | Temp 97.9°F | Resp 18

## 2015-09-16 DIAGNOSIS — R110 Nausea: Secondary | ICD-10-CM

## 2015-09-16 DIAGNOSIS — Z5111 Encounter for antineoplastic chemotherapy: Secondary | ICD-10-CM | POA: Diagnosis not present

## 2015-09-16 DIAGNOSIS — C801 Malignant (primary) neoplasm, unspecified: Secondary | ICD-10-CM | POA: Diagnosis not present

## 2015-09-16 LAB — COMPREHENSIVE METABOLIC PANEL
ALK PHOS: 65 U/L (ref 38–126)
ALT: 62 U/L — AB (ref 14–54)
ANION GAP: 8 (ref 5–15)
AST: 46 U/L — ABNORMAL HIGH (ref 15–41)
Albumin: 4.1 g/dL (ref 3.5–5.0)
BILIRUBIN TOTAL: 0.6 mg/dL (ref 0.3–1.2)
BUN: 19 mg/dL (ref 6–20)
CALCIUM: 9.1 mg/dL (ref 8.9–10.3)
CO2: 24 mmol/L (ref 22–32)
CREATININE: 0.7 mg/dL (ref 0.44–1.00)
Chloride: 106 mmol/L (ref 101–111)
GFR calc non Af Amer: >60 mL/min (ref >60)
GLUCOSE: 103 mg/dL — AB (ref 65–99)
Potassium: 4.1 mmol/L (ref 3.5–5.1)
SODIUM: 138 mmol/L (ref 135–145)
TOTAL PROTEIN: 7 g/dL (ref 6.5–8.1)

## 2015-09-16 LAB — CBC WITH DIFFERENTIAL/PLATELET
Basophils Absolute: 0 10*3/uL (ref 0.0–0.1)
Basophils Relative: 1 %
Eosinophils Absolute: 0.1 10*3/uL (ref 0.0–0.7)
Eosinophils Relative: 2 %
HEMATOCRIT: 36.3 % (ref 36.0–46.0)
HEMOGLOBIN: 12.4 g/dL (ref 12.0–15.0)
LYMPHS ABS: 1.2 10*3/uL (ref 0.7–4.0)
LYMPHS PCT: 22 %
MCH: 32.2 pg (ref 26.0–34.0)
MCHC: 34.2 g/dL (ref 30.0–36.0)
MCV: 94.3 fL (ref 78.0–100.0)
MONOS PCT: 13 %
Monocytes Absolute: 0.7 10*3/uL (ref 0.1–1.0)
NEUTROS ABS: 3.6 10*3/uL (ref 1.7–7.7)
NEUTROS PCT: 62 %
Platelets: 137 10*3/uL — ABNORMAL LOW (ref 150–400)
RBC: 3.85 MIL/uL — AB (ref 3.87–5.11)
RDW: 15.5 % (ref 11.5–15.5)
WBC: 5.6 10*3/uL (ref 4.0–10.5)

## 2015-09-16 MED ORDER — SODIUM CHLORIDE 0.9 % IV SOLN
Freq: Once | INTRAVENOUS | Status: AC
  Administered 2015-09-16: 10:00:00 via INTRAVENOUS
  Filled 2015-09-16: qty 5

## 2015-09-16 MED ORDER — PALONOSETRON HCL INJECTION 0.25 MG/5ML
0.2500 mg | Freq: Once | INTRAVENOUS | Status: AC
  Administered 2015-09-16: 0.25 mg via INTRAVENOUS

## 2015-09-16 MED ORDER — FLUOROURACIL CHEMO INJECTION 5 GM/100ML
2160.0000 mg/m2 | INTRAVENOUS | Status: DC
  Administered 2015-09-16: 3500 mg via INTRAVENOUS
  Filled 2015-09-16: qty 50

## 2015-09-16 MED ORDER — LORAZEPAM 2 MG/ML IJ SOLN
0.5000 mg | Freq: Once | INTRAMUSCULAR | Status: AC
  Administered 2015-09-16: 0.5 mg via INTRAVENOUS

## 2015-09-16 MED ORDER — DEXTROSE 5 % IV SOLN
Freq: Once | INTRAVENOUS | Status: AC
  Administered 2015-09-16: 09:00:00 via INTRAVENOUS

## 2015-09-16 MED ORDER — OXALIPLATIN CHEMO INJECTION 100 MG/20ML
85.0000 mg/m2 | Freq: Once | INTRAVENOUS | Status: AC
  Administered 2015-09-16: 140 mg via INTRAVENOUS
  Filled 2015-09-16: qty 28

## 2015-09-16 MED ORDER — SODIUM CHLORIDE 0.9% FLUSH
10.0000 mL | INTRAVENOUS | Status: DC | PRN
  Administered 2015-09-16: 10 mL
  Filled 2015-09-16: qty 10

## 2015-09-16 MED ORDER — LORAZEPAM 2 MG/ML IJ SOLN
INTRAMUSCULAR | Status: AC
  Filled 2015-09-16: qty 1

## 2015-09-16 MED ORDER — PALONOSETRON HCL INJECTION 0.25 MG/5ML
INTRAVENOUS | Status: AC
  Filled 2015-09-16: qty 5

## 2015-09-16 MED ORDER — LEUCOVORIN CALCIUM INJECTION 350 MG
400.0000 mg/m2 | Freq: Once | INTRAVENOUS | Status: AC
  Administered 2015-09-16: 652 mg via INTRAVENOUS
  Filled 2015-09-16: qty 32.6

## 2015-09-16 NOTE — Assessment & Plan Note (Addendum)
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 [name] on 06/26/2015 after findings a large abdominopelvic mass by Gynecologist, Dr. Evie Lacks. It is dictated that the patient had a pre-operative elevation of CA 125 and CEA.  Began systemic chemotherapy in the adjuvant setting on 07/22/2015 consisting of FOLFOX, complicated by allergic reaction to Oxaliplatin on cycle #2, resulting in the need for oxaliplatin desensitization.  Oncology history is updated.  Return in 2 weeks for follow-up, pretreatment labs, and follow-up appointment.  She will proceed today with regular FOLFOX.

## 2015-09-16 NOTE — Progress Notes (Signed)
   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 Persistent post operative elevation in CA-125 at 304.9 U/ml, normal post-op CEA  SUMMARY OF ONCOLOGIC HISTORY:   Mucinous adenocarcinoma (HCC)   06/24/2015 Imaging CT CAP- Complex cystic central pelvic mass remains suspicious for cystic ovarian carcinoma, most likely arising from the right ovary. Prominent right gonadal vasculature. Tiny hypodensity in the dome of the liver is too small to characterize, but stable.   06/26/2015 Pathology Results Adnexa - ovary +/- tube, neoplastic, right - MUCINOUS ADENOCARCINOMA, 13 CM. - OVARIAN SURFACE NOT INVOLVED BY TUMOR. - UNREMARKABLE FALLOPIAN TUBE WITH NO ENDOMETRIOSIS OR MALIGNANCY.   06/26/2015 Procedure Exploratory laparotomy with total hysterectomy bilateral salpingo-oophorectomy, pelvic and para-aortic lymph node dissection, omentectomy washings and biopsies for ovarian cancer by Dr. Wendy Brewster   06/27/2015 Pathology Results PERITONEAL WASHING(SPECIMEN 1 OF 1 COLLECTED 06/27/15): ATYPICAL CELLS. Immunohistochemistry calretinin, cytokeratin 5/6, estrogen receptor, progesterone receptor and WT-1 is performed. The morphology and immunophenotype favor reactive mesothelial cells   07/10/2015 Initial Diagnosis Mucinous adenocarcinoma (HCC)   07/22/2015 -  Chemotherapy FOLFOX   07/24/2015 Survivorship Genetic Counseling consultation with Karen Powell.   07/26/2015 Adverse Reaction Loose stools, 2-3 per 24 hours   07/29/2015 Treatment Plan Change 5FU bolus discontinued due to loose stools.   08/05/2015 Adverse Reaction Reaction to Oxaliplatin   09/01/2015 Treatment Plan Change Decrease 5FU CI by 10%    CURRENT THERAPY: FOLFOX  INTERVAL HISTORY: Natalie Ball 65 y.o. female returns for follow-up of mucinous adenocarcinoma.    She was here today with her niece and was in a treatment chair receiving cycle #5 of FOLFOX.  She has nausea all day every day. She said that her nausea medications do not help her. She drank some Fresca and some Cantaloupe water which helped her stomach. "it smoothed it right down". But she said that then nausea always comes back. She has not been vomiting from this however. She said that she has gotten close a couple of times but has not actually thrown up. She also has no belly pain. Her bowels are okay and has had no diarrhea.   She can taste food but everything is "bleh" She read an article in the [employer] newspaper about a new "super fruit" and asked if she could eat this.   Her fingers and toes are "wonderful". It still hurts to touch something cold the first few days after therapy. When this resolves, she is excited because she can have ice in her drinks and it doesn't bother her anymore.   She is able to sleep. Her mood is good. She denies any diarrhea or abdominal pain.   MEDICAL HISTORY: Past Medical History  Diagnosis Date  . Allergy   . GERD (gastroesophageal reflux disease)   . Complication of anesthesia   . PONV (postoperative nausea and vomiting)   . Wears glasses   . Tinnitus   . Vertigo   . Pneumonia   . History of bronchitis   . Urinary urgency   . Headache   . Mucinous adenocarcinoma (HCC) 07/10/2015  . Family history of ovarian cancer   . Family history of pancreatic cancer   . Family history of colon cancer     has Mucinous adenocarcinoma (HCC); Family history of ovarian cancer; Family history of pancreatic cancer; Family history of colon cancer;   Genetic testing; and Nausea without vomiting on her problem list.     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  _0 @  SURGICAL HISTORY: Past Surgical History  Procedure Laterality Date  . Tubal ligation  1974  . Diagnostic laparoscopy  1977  . Rectal fusula  1980  . Appendectomy    . Laparotomy N/A 06/26/2015     Procedure: EXPLORATORY LAPAROTOMY;  Surgeon: [name], MD;  Location: [facility];  Service: Gynecology;  Laterality: N/A;  . Abdominal hysterectomy N/A 06/26/2015    Procedure: TOTAL HYSTERECTOMY ABDOMINAL;  Surgeon: [name], MD;  Location: [facility];  Service: Gynecology;  Laterality: N/A;  . Salpingoophorectomy Bilateral 06/26/2015    Procedure: SALPINGO OOPHORECTOMY;  Surgeon: [name], MD;  Location: [facility];  Service: Gynecology;  Laterality: Bilateral;  . Omentectomy N/A 06/26/2015    Procedure:  OMENTECTOMY;  Surgeon: [name], MD;  Location: [facility];  Service: Gynecology;  Laterality: N/A;  . Debulking N/A 06/26/2015    Procedure: bilateral periaortic lymph node dissection biopsies and peritoneal washings;  Surgeon: [name], MD;  Location: [facility];  Service: Gynecology;  Laterality: N/A;  . Portacath placement Left 07/18/2015    Procedure: INSERTION PORT-A-CATH;  Surgeon: Aviva Signs, MD;  Location: [facility];  Service: General;  Laterality: Left;  pt knows to arrive at 6:15    SOCIAL HISTORY: Social History   Social History  . Marital Status: Widowed    Spouse Name: N/A  . Number of Children: 1  . Years of Education: N/A   Occupational History  . Not on file.   Social History Main Topics  . Smoking status: Former Smoker -- 1.00 packs/day for 19 years    Types: Cigarettes    Quit date: 06/14/1988  . Smokeless tobacco: Never Used  . Alcohol Use: Yes     Comment: occasionally   . Drug Use: No  . Sexual Activity: Not on file   Other Topics Concern  . Not on file   Social History Narrative  reports that she quit smoking about 27 years ago. Her smoking use included Cigarettes. She has a 19 pack-year smoking history. She has never used smokeless tobacco. She reports that she drinks alcohol. She reports that she does not use illicit drugs. She reports that prior to her illness, she enjoyed an intermittent beer or marguirtia. She denies being a heavy drinker of  EtOH. She notes that she is religious and she associates herselft with [place]. 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 She is widowed  FAMILY HISTORY: Family History  Problem Relation Age of Onset  . Cancer Father   . Colon polyps Father   . Ovarian cancer Maternal Aunt   . Colon cancer Maternal Aunt   . Pancreatic cancer Maternal Uncle   . Colon polyps Mother   . Colon polyps Sister   . Cancer Paternal Aunt     NOS  . Heart defect Maternal Grandmother     hole in the heart  . COPD Maternal Grandfather   . Colon cancer Paternal Grandmother     dx possibly under 60  . Mental illness Sister   . Uterine cancer Maternal Aunt     dx possibly in her 46s  . Pancreatic cancer Maternal Aunt   . Cancer Paternal Aunt     NOS  . Cancer Cousin     adrenal cancer dx in his 21s; maternal cousin  . Ovarian cancer Cousin     dx  in her 1s; maternal cousin  . Diabetes Daughter   Mother is alive at the age of 36 with heart disease, hypothyroidism, and valgus malformation of her ankles bilaterally inhibiting her ability to ambulate well. Father is 39 years old and alive with HTN and a mild form of dementia.  Sister is 22 yo with GERD Sister is 12 yo who was estranged from the patient until most recently. Sister 53 yo who has some sort of mental disorder requiring medications. She has 1 daughter who is 25 years old with DM and hypothyroidism. She has 1 granddaughter who is 46 yo who is overweight, but otherwise healthy. She is in college in [place], [place].  Review of Systems  Constitutional: Negative for fever, chills, weight loss and malaise/fatigue.  HENT: Negative for congestion, hearing loss, nosebleeds, sore throat and tinnitus.   Eyes: Negative for blurred vision, double vision, pain and discharge.  Respiratory: Negative for cough, hemoptysis, sputum production, shortness of breath and wheezing.   Cardiovascular: Negative for chest pain, palpitations,  claudication, leg swelling and PND.  Gastrointestinal: Positive for nausea. Negative for heartburn, vomiting, abdominal pain, diarrhea, constipation, blood in stool and melena.  Feels nauseous all day every day. Nausea medications do not help. Fresca and cantaloupe water helped.  Genitourinary: Negative for dysuria, urgency, frequency and hematuria.  Musculoskeletal: Negative for myalgias, joint pain and falls.  Skin: Negative for itching and rash.  Neurological: Negative for dizziness, tingling, tremors, sensory change, speech change, focal weakness, seizures, loss of consciousness, weakness and headaches.  Endo/Heme/Allergies: Does not bruise/bleed easily.  Psychiatric/Behavioral: Negative for depression, suicidal ideas, memory loss and substance abuse. The patient is not nervous/anxious and does not have insomnia.    14 point review of systems was performed and is negative except as detailed under history of present illness and above   PHYSICAL EXAMINATION  ECOG PERFORMANCE STATUS: 0 - Asymptomatic  Filed Vitals:   09/16/15 0842  BP: 148/90  Pulse: 72  Temp: 97.6 F (36.4 C)  Resp: 18    Physical Exam  Constitutional: She is oriented to person, place, and time and well-developed, well-nourished, and in no distress. hair thinning noted HENT:  Head: Normocephalic and atraumatic.  Nose: Nose normal.  Mouth/Throat: Oropharynx is clear and moist. No oropharyngeal exudate.  Eyes: Conjunctivae and EOM are normal. Pupils are equal, round, and reactive to light. Right eye exhibits no discharge. Left eye exhibits no discharge. No scleral icterus.  Neck: Normal range of motion. Neck supple. No tracheal deviation present. No thyromegaly present.  Cardiovascular: Normal rate, regular rhythm and normal heart sounds.  Exam reveals no gallop and no friction rub.   No murmur heard. Pulmonary/Chest: Effort normal and breath sounds normal. She has no wheezes. She has no rales.  Abdominal: Soft.  Bowel sounds are normal. She exhibits no distension and no mass. There is no tenderness. There is no rebound and no guarding.  Musculoskeletal: Normal range of motion. She exhibits no edema.  Lymphadenopathy:    She has no cervical adenopathy.  Neurological: She is alert and oriented to person, place, and time. She has normal reflexes. No cranial nerve deficit. Gait normal. Coordination normal.  Skin: Skin is warm and dry. No rash noted.  Psychiatric: Mood, memory, affect and judgment normal.  Nursing note and vitals reviewed.   LABORATORY DATA: I have reviewed the data as listed.  Results for Natalie, Ball (MRN [id number]) as of 09/16/2015 09:55  Ref. Range 09/16/2015 08:56  Sodium Latest Ref Range:  135-145 mmol/L 138  Potassium Latest Ref Range: 3.5-5.1 mmol/L 4.1  Chloride Latest Ref Range: 101-111 mmol/L 106  CO2 Latest Ref Range: 22-32 mmol/L 24  BUN Latest Ref Range: 6-20 mg/dL 19  Creatinine Latest Ref Range: 0.44-1.00 mg/dL 0.70  Calcium Latest Ref Range: 8.9-10.3 mg/dL 9.1  EGFR (Non-African Amer.) Latest Ref Range: >60 mL/min >60  EGFR (African American) Latest Ref Range: >60 mL/min >60  Glucose Latest Ref Range: 65-99 mg/dL 103 (H)  Anion gap Latest Ref Range: 5-15  8  Alkaline Phosphatase Latest Ref Range: 38-126 U/L 65  Albumin Latest Ref Range: 3.5-5.0 g/dL 4.1  AST Latest Ref Range: 15-41 U/L 46 (H)  ALT Latest Ref Range: 14-54 U/L 62 (H)  Total Protein Latest Ref Range: 6.5-8.1 g/dL 7.0  Total Bilirubin Latest Ref Range: 0.3-1.2 mg/dL 0.6  WBC Latest Ref Range: 4.0-10.5 K/uL 5.6  RBC Latest Ref Range: 3.87-5.11 MIL/uL 3.85 (L)  Hemoglobin Latest Ref Range: 12.0-15.0 g/dL 12.4  HCT Latest Ref Range: 36.0-46.0 % 36.3  MCV Latest Ref Range: 78.0-100.0 fL 94.3  MCH Latest Ref Range: 26.0-34.0 pg 32.2  MCHC Latest Ref Range: 30.0-36.0 g/dL 34.2  RDW Latest Ref Range: 11.5-15.5 % 15.5  Platelets Latest Ref Range: 150-400 K/uL 137 (L)  Neutrophils Latest Units: % 62    Lymphocytes Latest Units: % 22  Monocytes Relative Latest Units: % 13  Eosinophil Latest Units: % 2  Basophil Latest Units: % 1  NEUT# Latest Ref Range: 1.7-7.7 K/uL 3.6  Lymphocyte # Latest Ref Range: 0.7-4.0 K/uL 1.2  Monocyte # Latest Ref Range: 0.1-1.0 K/uL 0.7  Eosinophils Absolute Latest Ref Range: 0.0-0.7 K/uL 0.1  Basophils Absolute Latest Ref Range: 0.0-0.1 K/uL 0.0   Results for Natalie, Ball (MRN [id number]) as of 09/16/2015 16:13  Ref. Range 07/10/2015 16:05 08/19/2015 09:19  CA 125 Latest Ref Range: 0.0-38.1 U/mL 304.9 (H) 40.6 (H)   Results for Natalie, Ball (MRN [id number]) as of 09/16/2015 16:13  Ref. Range 07/10/2015 16:05  CEA Latest Ref Range: 0.0-4.7 ng/mL 1.4     RADIOGRAPHIC STUDIES: I have personally reviewed the radiological images as listed and agreed with the findings in the report.  Study Result     CLINICAL DATA: Cystic ovarian mass.  EXAM: CT CHEST, ABDOMEN, AND PELVIS WITH CONTRAST  TECHNIQUE: Multidetector CT imaging of the chest, abdomen and pelvis was performed following the standard protocol during bolus administration of intravenous contrast.  CONTRAST: 178m OMNIPAQUE IOHEXOL 300 MG/ML SOLN  COMPARISON: Pelvic MRI from 06/12/2015. CT chest from 04/07/2015.  FINDINGS: CT CHEST FINDINGS  Mediastinum/Lymph Nodes: There is no axillary lymphadenopathy. No mediastinal lymphadenopathy. There is no hilar lymphadenopathy. The heart size is normal. No pericardial effusion. The esophagus has normal imaging features. Small hiatal hernia noted.  Lungs/Pleura: No focal airspace consolidation. No pulmonary edema or pleural effusion. 4 mm left lower lobe pulmonary nodule (image 37 series 4) is stable since 04/07/2015. 3 mm right middle lobe nodule on the same image is also stable.  Musculoskeletal: Bone windows reveal no worrisome lytic or sclerotic osseous lesions.  CT ABDOMEN PELVIS FINDINGS  Hepatobiliary: 5 mm hypodensity in  the dome of the liver is stable since 04/07/2015. Geographic low attenuation of the liver parenchyma is compatible with fatty deposition. There is no evidence for gallstones, gallbladder wall thickening, or pericholecystic fluid. No intrahepatic or extrahepatic biliary dilation.  Pancreas: No focal mass lesion. No dilatation of the main duct. No intraparenchymal cyst. No peripancreatic edema.  Spleen: No splenomegaly.  No focal mass lesion.  Adrenals/Urinary Tract: No adrenal nodule or mass. 7 mm low-density lesion extreme upper pole right kidney too small to characterize but likely a tiny cyst. Left kidney unremarkable. No evidence for hydroureter. Bladder is decompressed.  Stomach/Bowel: Small hiatal hernia. Stomach otherwise unremarkable. Duodenum is normally positioned as is the ligament of Treitz. No small bowel wall thickening. No small bowel dilatation. The terminal ileum is normal. The appendix is not visualized, but there is no edema or inflammation in the region of the cecum. Diverticuli are seen scattered along the entire length of the colon without CT findings of diverticulitis.  Vascular/Lymphatic: No abdominal aortic aneurysm. No abdominal atherosclerotic calcification. There is no gastrohepatic or hepatoduodenal ligament lymphadenopathy. No intraperitoneal or retroperitoneal lymphadenopy. No pelvic sidewall lymphadenopathy.  Reproductive: Uterus is unremarkable aside from displacement by the large pelvic mass. 23.4 x 11.6 x 25.8 cm multiloculated complex cystic lesion is identified in the anterior aspect of the central pelvis. This mass appears to be distinct from both the uterus and left ovary in the right gonadal vasculature tracks into the region of this mass. Imaging features are highly suspicious for cystic ovarian neoplasm.  Other: Trace intraperitoneal free fluid is seen in the cul-de-sac and adjacent to the liver tip.  Musculoskeletal: Bone  windows reveal no worrisome lytic or sclerotic osseous lesions.  IMPRESSION: 1. Complex cystic central pelvic mass as seen previously. 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 2. Prominent right gonadal vasculature. 3. Small hiatal hernia. 4. Tiny hypodensity in the dome of the liver is too small to characterize, but is unchanged since 04/07/2015. 5. Trace intraperitoneal free fluid including adjacent to the right liver and in the central pelvis.   Electronically Signed  By: Eric Mansell M.D.  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 Persistent post operative elevation in CA-125 at 304.9 U/ml, normal post-op CEA Oxaliplatin reaction  Mucinous adenocarcinoma (HCC) Stage IC Mucinous adenocarcinoma (based upon intra-surgical findings) measuring 13 cm without right ovarian surface involvement, S/P exploratory laparotomy with TAH-BSO by Dr. Wendy Brewster on 06/26/2015 after findings a large abdominopelvic mass by Gynecologist, Dr. Buist. It is dictated that the patient had a pre-operative elevation of CA 125 and CEA.  Began systemic chemotherapy in the adjuvant setting on 07/22/2015 consisting of FOLFOX, complicated by allergic reaction to Oxaliplatin on cycle #2, resulting in the need for oxaliplatin desensitization.  Oncology history is updated.  Return in 2 weeks for follow-up, pretreatment labs, and follow-up appointment.  She will proceed today with regular FOLFOX.      Nausea without vomiting She has had persistent nausea, this has become very problematic for her. She is doing a good job maintaining her weight. I have added emend and ativan to her anti-emetics today.  She will receive additional anti-emetics on D3 of pump removal.  She notes her oral anti-emetics do little to alleviate  her nausea and she has days she really struggles with this.  She is on a PPI.   Orders Placed This Encounter  Procedures  . CEA    Standing Status: Future     Number of Occurrences:      Standing Expiration Date: 09/15/2016  . CA 125    Standing Status: Future     Number of Occurrences:      Standing Expiration Date: 09/15/2016     All questions were answered. The patient knows to call the clinic with any problems, questions or concerns. We can certainly see the patient much sooner if necessary. . This document serves as a record of services personally performed by Shannon Penland, MD. It was created on her behalf by Margaret M Deigan, a trained medical scribe. The creation of this record is based on the scribe's personal observations and the provider's statements to them. This document has been checked and approved by the attending provider. I have reviewed the above documentation for accuracy and completeness, and I agree with the above.  This note was electronically signed. Penland,Shannon Kristen, MD  09/16/2015    

## 2015-09-16 NOTE — Assessment & Plan Note (Signed)
She has had persistent nausea, this has become very problematic for her. She is doing a good job maintaining her weight. I have added emend and ativan to her anti-emetics today.  She will receive additional anti-emetics on D3 of pump removal.  She notes her oral anti-emetics do little to alleviate her nausea and she has days she really struggles with this.  She is on a PPI.

## 2015-09-16 NOTE — Patient Instructions (Addendum)
[facility] Discharge Instructions  RECOMMENDATIONS MADE BY THE CONSULTANT AND ANY TEST RESULTS WILL BE SENT TO YOUR REFERRING PHYSICIAN.    Exam and discussion by Dr Whitney Muse today You can go visit the person at the nursing home.  We can get you to sit down with Angie, she is our Development worker, community up here. Return Thursday to have your pump removed. Return to see the doctor in 2 weeks  Please call the clinic if you have any questions or concerns    Thank you for choosing [facility] at [facility] to provide your oncology and hematology care.  To afford each patient quality time with our provider, please arrive at least 15 minutes before your scheduled appointment time.   Beginning January 23rd 2017 lab work for the [facility] will be done in the  Main lab at [facility] on 1st floor. If you have a lab appointment with the [facility] please come in thru the  Main Entrance and check in at the main information desk  You need to re-schedule your appointment should you arrive 10 or more minutes late.  We strive to give you quality time with our providers, and arriving late affects you and other patients whose appointments are after yours.  Also, if you no show three or more times for appointments you may be dismissed from the clinic at the providers discretion.     Again, thank you for choosing [facility].  Our hope is that these requests will decrease the amount of time that you wait before being seen by our physicians.       _____________________________________________________________  Should you have questions after your visit to [facility], please contact our office at [phone] between the hours of 8:30 a.m. and 4:30 p.m.  Voicemails left after 4:30 p.m. will not be returned until the following business day.  For prescription refill requests, have your pharmacy contact our office.          Resources For Cancer Patients and their Caregivers ? American Cancer Society: Can assist with transportation, wigs, general needs, runs Look Good Feel Better.        781-883-9162 ? Cancer Care: Provides financial assistance, online support groups, medication/co-pay assistance.  1-800-813-HOPE (518) 886-5533) ? [facility] Assists [place] cancer patients and their families through emotional , educational and financial support.  818-798-4750 ? [place] DSS Where to apply for food stamps, Medicaid and utility assistance. 254-821-4173 ? [facility]: Transportation to medical appointments. 838-847-6636 ? Social Security Administration: May apply for disability if have a Stage IV cancer. (647) 601-8377 734-850-0412 ? [employer], Disability and Transit Services: Assists with nutrition, care and transit needs. 947-583-6219

## 2015-09-16 NOTE — Progress Notes (Signed)
Tolerated FOLFOX without problems.

## 2015-09-16 NOTE — Patient Instructions (Signed)
[facility] Discharge Instructions for Patients Receiving Chemotherapy   Beginning January 23rd 2017 lab work for the [facility] will be done in the  Main lab at [facility] on 1st floor. If you have a lab appointment with the [facility] please come in thru the  Main Entrance and check in at the main information desk   Today you received the following chemotherapy agents oxaliplatin, 5 fu, leucovorin  To help prevent nausea and vomiting after your treatment, we encourage you to take your nausea medication     If you develop nausea and vomiting, or diarrhea that is not controlled by your medication, call the clinic.  The clinic phone number is [phone]. Office hours are Monday-Friday 8:30am-5:00pm.  BELOW ARE SYMPTOMS THAT SHOULD BE REPORTED IMMEDIATELY:  *FEVER GREATER THAN 101.0 F  *CHILLS WITH OR WITHOUT FEVER  NAUSEA AND VOMITING THAT IS NOT CONTROLLED WITH YOUR NAUSEA MEDICATION  *UNUSUAL SHORTNESS OF BREATH  *UNUSUAL BRUISING OR BLEEDING  TENDERNESS IN MOUTH AND THROAT WITH OR WITHOUT PRESENCE OF ULCERS  *URINARY PROBLEMS  *BOWEL PROBLEMS  UNUSUAL RASH Items with * indicate a potential emergency and should be followed up as soon as possible. If you have an emergency after office hours please contact your primary care physician or go to the nearest emergency department.  Please call the clinic during office hours if you have any questions or concerns.   You may also contact the Patient Navigator at [phone] should you have any questions or need assistance in obtaining follow up care.      Resources For Cancer Patients and their Caregivers ? American Cancer Society: Can assist with transportation, wigs, general needs, runs Look Good Feel Better.        825-825-9647 ? Cancer Care: Provides financial assistance, online support groups, medication/co-pay assistance.  1-800-813-HOPE (617) 777-2331) ? [place] Assists [place] cancer patients and their families through emotional , educational and financial support.  279-723-3940 ? [place] DSS Where to apply for food stamps, Medicaid and utility assistance. 506-880-4481 ? [facility]: Transportation to medical appointments. (681)734-8663 ? Social Security Administration: May apply for disability if have a Stage IV cancer. ([phone] 807-777-9842 ? [employer], Disability and Transit Services: Assists with nutrition, care and transit needs. (303)564-7586

## 2015-09-18 ENCOUNTER — Encounter (HOSPITAL_COMMUNITY): Payer: Self-pay

## 2015-09-18 ENCOUNTER — Encounter (HOSPITAL_BASED_OUTPATIENT_CLINIC_OR_DEPARTMENT_OTHER): Payer: BLUE CROSS/BLUE SHIELD

## 2015-09-18 VITALS — BP 116/57 | HR 68 | Temp 98.5°F | Resp 16

## 2015-09-18 DIAGNOSIS — C801 Malignant (primary) neoplasm, unspecified: Secondary | ICD-10-CM | POA: Diagnosis not present

## 2015-09-18 DIAGNOSIS — R110 Nausea: Secondary | ICD-10-CM | POA: Diagnosis not present

## 2015-09-18 MED ORDER — HEPARIN SOD (PORK) LOCK FLUSH 100 UNIT/ML IV SOLN: 500.0000 [IU] | Freq: Once | INTRAVENOUS | Status: DC | PRN

## 2015-09-18 MED ORDER — LORAZEPAM 2 MG/ML IJ SOLN
0.5000 mg | Freq: Once | INTRAMUSCULAR | Status: AC
  Administered 2015-09-18: 0.5 mg via INTRAVENOUS

## 2015-09-18 MED ORDER — SODIUM CHLORIDE 0.9% FLUSH
10.0000 mL | INTRAVENOUS | Status: DC | PRN
  Administered 2015-09-18: 10 mL
  Filled 2015-09-18: qty 10

## 2015-09-18 MED ORDER — HEPARIN SOD (PORK) LOCK FLUSH 100 UNIT/ML IV SOLN
INTRAVENOUS | Status: AC
  Filled 2015-09-18: qty 5

## 2015-09-18 MED ORDER — LORAZEPAM 2 MG/ML IJ SOLN
INTRAMUSCULAR | Status: AC
  Filled 2015-09-18: qty 1

## 2015-09-18 NOTE — Progress Notes (Signed)
Natalie Ball returns today for port de access and flush after 46 hr continous infusion of 4fu. Tolerated infusion without problems. Very minimal nausea with this cycle. Lorazepam given as ordered. Portacath located left chest wall was  deaccessed and flushed with 51ml NS and 500U/72ml Heparin and needle removed intact.  Procedure without incident. Patient tolerated procedure well.

## 2015-09-30 ENCOUNTER — Encounter (HOSPITAL_COMMUNITY): Payer: Self-pay | Admitting: Hematology & Oncology

## 2015-09-30 ENCOUNTER — Encounter (HOSPITAL_BASED_OUTPATIENT_CLINIC_OR_DEPARTMENT_OTHER): Payer: BLUE CROSS/BLUE SHIELD | Admitting: Hematology & Oncology

## 2015-09-30 ENCOUNTER — Encounter (HOSPITAL_BASED_OUTPATIENT_CLINIC_OR_DEPARTMENT_OTHER): Payer: BLUE CROSS/BLUE SHIELD

## 2015-09-30 VITALS — BP 135/66 | HR 65 | Temp 98.1°F | Resp 16 | Wt 129.0 lb

## 2015-09-30 DIAGNOSIS — D6959 Other secondary thrombocytopenia: Secondary | ICD-10-CM | POA: Diagnosis not present

## 2015-09-30 DIAGNOSIS — Z5111 Encounter for antineoplastic chemotherapy: Secondary | ICD-10-CM

## 2015-09-30 DIAGNOSIS — R110 Nausea: Secondary | ICD-10-CM

## 2015-09-30 DIAGNOSIS — D696 Thrombocytopenia, unspecified: Secondary | ICD-10-CM

## 2015-09-30 DIAGNOSIS — C801 Malignant (primary) neoplasm, unspecified: Secondary | ICD-10-CM | POA: Diagnosis not present

## 2015-09-30 LAB — CBC WITH DIFFERENTIAL/PLATELET
BASOS ABS: 0 10*3/uL (ref 0.0–0.1)
Basophils Relative: 0 %
Eosinophils Absolute: 0.1 10*3/uL (ref 0.0–0.7)
Eosinophils Relative: 2 %
HEMATOCRIT: 32.7 % — AB (ref 36.0–46.0)
Hemoglobin: 11.2 g/dL — ABNORMAL LOW (ref 12.0–15.0)
LYMPHS PCT: 22 %
Lymphs Abs: 1 10*3/uL (ref 0.7–4.0)
MCH: 32.6 pg (ref 26.0–34.0)
MCHC: 34.3 g/dL (ref 30.0–36.0)
MCV: 95.1 fL (ref 78.0–100.0)
MONO ABS: 0.6 10*3/uL (ref 0.1–1.0)
MONOS PCT: 13 %
NEUTROS ABS: 3 10*3/uL (ref 1.7–7.7)
Neutrophils Relative %: 64 %
Platelets: 93 10*3/uL — ABNORMAL LOW (ref 150–400)
RBC: 3.44 MIL/uL — ABNORMAL LOW (ref 3.87–5.11)
RDW: 16.1 % — AB (ref 11.5–15.5)
WBC: 4.7 10*3/uL (ref 4.0–10.5)

## 2015-09-30 LAB — COMPREHENSIVE METABOLIC PANEL
ALT: 83 U/L — AB (ref 14–54)
AST: 47 U/L — AB (ref 15–41)
Albumin: 3.7 g/dL (ref 3.5–5.0)
Alkaline Phosphatase: 101 U/L (ref 38–126)
Anion gap: 9 (ref 5–15)
BILIRUBIN TOTAL: 0.4 mg/dL (ref 0.3–1.2)
BUN: 16 mg/dL (ref 6–20)
CALCIUM: 9 mg/dL (ref 8.9–10.3)
CO2: 25 mmol/L (ref 22–32)
CREATININE: 0.54 mg/dL (ref 0.44–1.00)
Chloride: 107 mmol/L (ref 101–111)
GFR calc Af Amer: >60 mL/min (ref >60)
Glucose, Bld: 93 mg/dL (ref 65–99)
POTASSIUM: 3.9 mmol/L (ref 3.5–5.1)
Sodium: 141 mmol/L (ref 135–145)
TOTAL PROTEIN: 6.7 g/dL (ref 6.5–8.1)

## 2015-09-30 MED ORDER — OXALIPLATIN CHEMO INJECTION 100 MG/20ML
75.0000 mg/m2 | Freq: Once | INTRAVENOUS | Status: AC
  Administered 2015-09-30: 120 mg via INTRAVENOUS
  Filled 2015-09-30: qty 24

## 2015-09-30 MED ORDER — LORAZEPAM 2 MG/ML IJ SOLN
0.5000 mg | Freq: Once | INTRAMUSCULAR | Status: AC
  Administered 2015-09-30: 0.5 mg via INTRAVENOUS
  Filled 2015-09-30: qty 1

## 2015-09-30 MED ORDER — PALONOSETRON HCL INJECTION 0.25 MG/5ML
0.2500 mg | Freq: Once | INTRAVENOUS | Status: AC
  Administered 2015-09-30: 0.25 mg via INTRAVENOUS
  Filled 2015-09-30: qty 5

## 2015-09-30 MED ORDER — DEXTROSE 5 % IV SOLN
400.0000 mg/m2 | Freq: Once | INTRAVENOUS | Status: AC
  Administered 2015-09-30: 652 mg via INTRAVENOUS
  Filled 2015-09-30: qty 32.6

## 2015-09-30 MED ORDER — SODIUM CHLORIDE 0.9% FLUSH
10.0000 mL | INTRAVENOUS | Status: DC | PRN
  Administered 2015-09-30: 10 mL
  Filled 2015-09-30: qty 10

## 2015-09-30 MED ORDER — SODIUM CHLORIDE 0.9 % IV SOLN
Freq: Once | INTRAVENOUS | Status: AC
  Administered 2015-09-30: 10:00:00 via INTRAVENOUS
  Filled 2015-09-30: qty 5

## 2015-09-30 MED ORDER — DEXTROSE 5 % IV SOLN
Freq: Once | INTRAVENOUS | Status: AC
  Administered 2015-09-30: 09:00:00 via INTRAVENOUS

## 2015-09-30 MED ORDER — SODIUM CHLORIDE 0.9 % IV SOLN
2160.0000 mg/m2 | INTRAVENOUS | Status: DC
  Administered 2015-09-30: 3500 mg via INTRAVENOUS
  Filled 2015-09-30: qty 70

## 2015-09-30 NOTE — Progress Notes (Signed)
[place] [facility] [place] 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 Prominent right gonadal vasculature. Tiny hypodensity in the dome of the liver is too small to characterize, but stable.   06/26/2015 Pathology Results Adnexa - ovary +/- tube, neoplastic, right - MUCINOUS ADENOCARCINOMA, 13 CM. - OVARIAN SURFACE NOT INVOLVED BY TUMOR. - UNREMARKABLE FALLOPIAN TUBE WITH NO ENDOMETRIOSIS OR MALIGNANCY.   06/26/2015 Procedure Exploratory laparotomy with total hysterectomy bilateral salpingo-oophorectomy, pelvic and para-aortic lymph node dissection, omentectomy washings and biopsies for ovarian cancer by Dr. [name]   06/27/2015 Pathology Results PERITONEAL WASHING(SPECIMEN 1 OF 1 COLLECTED 06/27/15): ATYPICAL CELLS. Immunohistochemistry calretinin, cytokeratin 5/6, estrogen receptor, progesterone receptor and WT-1 is performed. The morphology and immunophenotype favor reactive mesothelial cells   07/10/2015 Initial Diagnosis Mucinous adenocarcinoma ([place])   07/22/2015 -  Chemotherapy FOLFOX   07/24/2015 Survivorship Genetic Counseling consultation with [name].   07/26/2015 Adverse Reaction Loose stools, 2-3 per 24 hours   07/29/2015 Treatment Plan Change 5FU bolus discontinued due to loose stools.   08/05/2015 Adverse Reaction Reaction to Oxaliplatin   09/01/2015 Treatment Plan Change Decrease 5FU CI by 10%    CURRENT THERAPY: FOLFOX  INTERVAL HISTORY: Natalie Ball 65 y.o. female returns for follow-up of mucinous adenocarcinoma. She is  here for Cycle #6 FOLFOX.   Ms. Eddington returns to the [facility] today accompanied by her boyfriend, [name]. She is in her typical pleasant mood, noting that she "feels mischievous today."  Her main complaint is constant nausea, but she declares that she's found some things that help her. She notes that if she takes lemons and slices them really thin, or puts a lot of salt on her food, it seems to take the edge off. She says that, specifically, mandarin oranges also help her. She says she's used ginger, but never the crystallized form, which she is open to trying.   She notes that her nausea starts as soon as she wakes up in the morning. She says yesterday, as the day went along, it went away. She remarks that, though she typically tries to avoid citrus because of her acid reflux, she craved orange juice yesterday and "drank a whole carton of it," and feels that this is what ultimately made her nausea go away.  She is taking famotidine (?) for her acid reflux. She notes that the PPI we prescribed did not seem to help.   She notes that her bowels are moving well. She denies any vomiting, and that she eats anything that she wants; but the nausea is only typically taken care of using lemons and salt as noted.  In terms of treating her nausea with medication, she says she takes the "teeny weeny pills," the Ativan. She says they seem to help and she prefers them since she doesn't get as sleepy as she used to. She says the "other things made her go to sleep," and she couldn't drive, which is inconvenient for her as an active person.  She says "once we take off the black witch" (her pump) her nausea gets worse; that "next  week when I'm free" her nausea will be better.  In terms of her treatment trajectory, she says she's been trying to take her life back; going to choir; trying to get back to life. She says she feels good because she's halfway there.  She is currently unhappy with her insurance company,  since her genetic testing was considered "not of medical necessity" and she will owe over $600 out of pocket.  During the physical exam, she denies any rash anywhere, and confirms that her breathing is ok. She notes that her fingers and toes are fine right now but she "dare not touch anything cold." She says she can button her shirts okay and walk okay, and that she knows the sensitivity to cold will go away one day. She also looks forward to "getting her tongue back."  She was on a trip to [facility] recently with her daughter to see her granddaughter. She says it was breathtakingly beautiful and she hopes to go back soon.   MEDICAL HISTORY: Past Medical History  Diagnosis Date  . Allergy   . GERD (gastroesophageal reflux disease)   . Complication of anesthesia   . PONV (postoperative nausea and vomiting)   . Wears glasses   . Tinnitus   . Vertigo   . Pneumonia   . History of bronchitis   . Urinary urgency   . Headache   . Mucinous adenocarcinoma ([place]) 07/10/2015  . Family history of ovarian cancer   . Family history of pancreatic cancer   . Family history of colon cancer     has Mucinous adenocarcinoma ([place]); Family history of ovarian cancer; Family history of pancreatic cancer; Family history of colon cancer; Genetic testing; and Nausea without vomiting on her problem list.     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  '@MEDADMINPROSE'$ @  SURGICAL HISTORY: Past Surgical History  Procedure Laterality Date  . Tubal ligation  1974  . Diagnostic laparoscopy  1977  . Rectal fusula  1980  . Appendectomy    . Laparotomy N/A 06/26/2015    Procedure: EXPLORATORY LAPAROTOMY;  Surgeon: [name], MD;  Location: [facility];  Service: Gynecology;  Laterality: N/A;  . Abdominal hysterectomy N/A 06/26/2015    Procedure: TOTAL HYSTERECTOMY ABDOMINAL;  Surgeon: [name], MD;  Location: [facility];  Service: Gynecology;  Laterality: N/A;  . Salpingoophorectomy Bilateral 06/26/2015    Procedure: SALPINGO  OOPHORECTOMY;  Surgeon: [name], MD;  Location: [facility];  Service: Gynecology;  Laterality: Bilateral;  . Omentectomy N/A 06/26/2015    Procedure:  OMENTECTOMY;  Surgeon: [name], MD;  Location: [facility];  Service: Gynecology;  Laterality: N/A;  . Debulking N/A 06/26/2015    Procedure: bilateral periaortic lymph node dissection biopsies and peritoneal washings;  Surgeon: [name], MD;  Location: [facility];  Service: Gynecology;  Laterality: N/A;  . Portacath placement Left 07/18/2015    Procedure: INSERTION PORT-A-CATH;  Surgeon: Aviva Signs, MD;  Location: [facility];  Service: General;  Laterality: Left;  pt knows to arrive at 6:15    SOCIAL HISTORY: Social History   Social History  . Marital Status: Widowed    Spouse Name: N/A  . Number of Children: 1  . Years of Education: N/A   Occupational History  . Not on file.   Social History Main Topics  . Smoking status: Former Smoker -- 1.00 packs/day for 19 years    Types: Cigarettes    Quit date: 06/14/1988  . Smokeless tobacco: Never Used  . Alcohol Use: Yes  Comment: occasionally   . Drug Use: No  . Sexual Activity: Not on file   Other Topics Concern  . Not on file   Social History Narrative  reports that she quit smoking about 27 years ago. Her smoking use included Cigarettes. She has a 19 pack-year smoking history. She has never used smokeless tobacco. She reports that she drinks alcohol. She reports that she does not use illicit drugs. She reports that prior to her illness, she enjoyed an intermittent beer or marguirtia. She denies being a heavy drinker of EtOH. She notes that she is religious and she associates herselft with [place]. 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 She is widowed  FAMILY HISTORY: Family History  Problem Relation Age of Onset  . Cancer Father   . Colon polyps Father   . Ovarian cancer Maternal Aunt   . Colon cancer Maternal Aunt   . Pancreatic cancer  Maternal Uncle   . Colon polyps Mother   . Colon polyps Sister   . Cancer Paternal Aunt     NOS  . Heart defect Maternal Grandmother     hole in the heart  . COPD Maternal Grandfather   . Colon cancer Paternal Grandmother     dx possibly under 36  . Mental illness Sister   . Uterine cancer Maternal Aunt     dx possibly in her 5s  . Pancreatic cancer Maternal Aunt   . Cancer Paternal Aunt     NOS  . Cancer Cousin     adrenal cancer dx in his 48s; maternal cousin  . Ovarian cancer Cousin     dx in her 65s; maternal cousin  . Diabetes Daughter   Mother is alive at the age of 70 with heart disease, hypothyroidism, and valgus malformation of her ankles bilaterally inhibiting her ability to ambulate well. Father is 12 years old and alive with HTN and a mild form of dementia.  Sister is 57 yo with GERD Sister is 54 yo who was estranged from the patient until most recently. Sister 76 yo who has some sort of mental disorder requiring medications. She has 1 daughter who is 4 years old with DM and hypothyroidism. She has 1 granddaughter who is 40 yo who is overweight, but otherwise healthy. She is in college in [place], [place].  Review of Systems  Constitutional: Negative for fever, chills, weight loss and malaise/fatigue.  HENT: Negative for congestion, hearing loss, nosebleeds, sore throat and tinnitus.   Eyes: Negative for blurred vision, double vision, pain and discharge.  Respiratory: Negative for cough, hemoptysis, sputum production, shortness of breath and wheezing.   Cardiovascular: Negative for chest pain, palpitations, claudication, leg swelling and PND.  Gastrointestinal: Positive for nausea. Negative for heartburn, vomiting, abdominal pain, diarrhea, constipation, blood in stool and melena.  Feels nauseous all day every day. Nausea medications do not help. Fresca and cantaloupe water helped.  Genitourinary: Negative for dysuria, urgency, frequency and hematuria.    Musculoskeletal: Negative for myalgias, joint pain and falls.  Skin: Negative for itching and rash.  Neurological: Negative for dizziness, tingling, tremors, sensory change, speech change, focal weakness, seizures, loss of consciousness, weakness and headaches.  Endo/Heme/Allergies: Does not bruise/bleed easily.  Psychiatric/Behavioral: Negative for depression, suicidal ideas, memory loss and substance abuse. The patient is not nervous/anxious and does not have insomnia.    14 point review of systems was performed and is negative except as detailed under history of present illness and above  PHYSICAL EXAMINATION  ECOG PERFORMANCE STATUS: 0 - Asymptomatic   Vitals - 1 value per visit 0/99/8338  SYSTOLIC 250  DIASTOLIC 68  Pulse 67  Temperature 97.7  Respirations 16  Weight (lb) 129  Height   BMI 21.47  VISIT REPORT     Physical Exam  Constitutional: She is oriented to person, place, and time and well-developed, well-nourished, and in no distress. hair thinning noted Well Groomed HENT:  Head: Normocephalic and atraumatic.  Nose: Nose normal.  Mouth/Throat: Oropharynx is clear and moist. No oropharyngeal exudate.  Eyes: Conjunctivae and EOM are normal. Pupils are equal, round, and reactive to light. Right eye exhibits no discharge. Left eye exhibits no discharge. No scleral icterus.  Neck: Normal range of motion. Neck supple. No tracheal deviation present. No thyromegaly present.  Cardiovascular: Normal rate, regular rhythm and normal heart sounds.  Exam reveals no gallop and no friction rub.   No murmur heard. Pulmonary/Chest: Effort normal and breath sounds normal. She has no wheezes. She has no rales.  Abdominal: Soft. Bowel sounds are normal. She exhibits no distension and no mass. There is no tenderness. There is no rebound and no guarding.  Musculoskeletal: Normal range of motion. She exhibits no edema.  Lymphadenopathy:    She has no cervical adenopathy.  Neurological:  She is alert and oriented to person, place, and time. She has normal reflexes. No cranial nerve deficit. Gait normal. Coordination normal.  Skin: Skin is warm and dry. No rash noted.  Psychiatric: Mood, memory, affect and judgment normal.  Nursing note and vitals reviewed.   LABORATORY DATA: I have reviewed the data as listed. Results for GWENDOLEN, HEWLETT (MRN [id number]) as of 09/30/2015 09:32  Ref. Range 09/30/2015 08:58  Sodium Latest Ref Range: 135-145 mmol/L 141  Potassium Latest Ref Range: 3.5-5.1 mmol/L 3.9  Chloride Latest Ref Range: 101-111 mmol/L 107  CO2 Latest Ref Range: 22-32 mmol/L 25  BUN Latest Ref Range: 6-20 mg/dL 16  Creatinine Latest Ref Range: 0.44-1.00 mg/dL 0.54  Calcium Latest Ref Range: 8.9-10.3 mg/dL 9.0  EGFR (Non-African Amer.) Latest Ref Range: >60 mL/min >60  EGFR (African American) Latest Ref Range: >60 mL/min >60  Glucose Latest Ref Range: 65-99 mg/dL 93  Anion gap Latest Ref Range: 5-15  9  Alkaline Phosphatase Latest Ref Range: 38-126 U/L 101  Albumin Latest Ref Range: 3.5-5.0 g/dL 3.7  AST Latest Ref Range: 15-41 U/L 47 (H)  ALT Latest Ref Range: 14-54 U/L 83 (H)  Total Protein Latest Ref Range: 6.5-8.1 g/dL 6.7  Total Bilirubin Latest Ref Range: 0.3-1.2 mg/dL 0.4  WBC Latest Ref Range: 4.0-10.5 K/uL 4.7  RBC Latest Ref Range: 3.87-5.11 MIL/uL 3.44 (L)  Hemoglobin Latest Ref Range: 12.0-15.0 g/dL 11.2 (L)  HCT Latest Ref Range: 36.0-46.0 % 32.7 (L)  MCV Latest Ref Range: 78.0-100.0 fL 95.1  MCH Latest Ref Range: 26.0-34.0 pg 32.6  MCHC Latest Ref Range: 30.0-36.0 g/dL 34.3  RDW Latest Ref Range: 11.5-15.5 % 16.1 (H)  Platelets Latest Ref Range: 150-400 K/uL 93 (L)  Neutrophils Latest Units: % 64  Lymphocytes Latest Units: % 22  Monocytes Relative Latest Units: % 13  Eosinophil Latest Units: % 2  Basophil Latest Units: % 0  NEUT# Latest Ref Range: 1.7-7.7 K/uL 3.0  Lymphocyte # Latest Ref Range: 0.7-4.0 K/uL 1.0  Monocyte # Latest Ref Range:  0.1-1.0 K/uL 0.6  Eosinophils Absolute Latest Ref Range: 0.0-0.7 K/uL 0.1  Basophils Absolute Latest Ref Range: 0.0-0.1 K/uL 0.0    Results  for JAEDAH, LORDS (MRN [id number]) as of 09/16/2015 16:13  Ref. Range 07/10/2015 16:05 08/19/2015 09:19  CA 125 Latest Ref Range: 0.0-38.1 U/mL 304.9 (H) 40.6 (H)   Results for MYKIA, HOLTON (MRN [id number]) as of 09/16/2015 16:13  Ref. Range 07/10/2015 16:05  CEA Latest Ref Range: 0.0-4.7 ng/mL 1.4     RADIOGRAPHIC STUDIES: I have personally reviewed the radiological images as listed and agreed with the findings in the report.  Study Result     CLINICAL DATA: Cystic ovarian mass.  EXAM: CT CHEST, ABDOMEN, AND PELVIS WITH CONTRAST  TECHNIQUE: Multidetector CT imaging of the chest, abdomen and pelvis was performed following the standard protocol during bolus administration of intravenous contrast.  CONTRAST: 117m OMNIPAQUE IOHEXOL 300 MG/ML SOLN  COMPARISON: Pelvic MRI from 06/12/2015. CT chest from 04/07/2015.  FINDINGS: CT CHEST FINDINGS  Mediastinum/Lymph Nodes: There is no axillary lymphadenopathy. No mediastinal lymphadenopathy. There is no hilar lymphadenopathy. The heart size is normal. No pericardial effusion. The esophagus has normal imaging features. Small hiatal hernia noted.  Lungs/Pleura: No focal airspace consolidation. No pulmonary edema or pleural effusion. 4 mm left lower lobe pulmonary nodule (image 37 series 4) is stable since 04/07/2015. 3 mm right middle lobe nodule on the same image is also stable.  Musculoskeletal: Bone windows reveal no worrisome lytic or sclerotic osseous lesions.  CT ABDOMEN PELVIS FINDINGS  Hepatobiliary: 5 mm hypodensity in the dome of the liver is stable since 04/07/2015. Geographic low attenuation of the liver parenchyma is compatible with fatty deposition. There is no evidence for gallstones, gallbladder wall thickening, or pericholecystic fluid. No intrahepatic or  extrahepatic biliary dilation.  Pancreas: No focal mass lesion. No dilatation of the main duct. No intraparenchymal cyst. No peripancreatic edema.  Spleen: No splenomegaly. No focal mass lesion.  Adrenals/Urinary Tract: No adrenal nodule or mass. 7 mm low-density lesion extreme upper pole right kidney too small to characterize but likely a tiny cyst. Left kidney unremarkable. No evidence for hydroureter. Bladder is decompressed.  Stomach/Bowel: Small hiatal hernia. Stomach otherwise unremarkable. Duodenum is normally positioned as is the ligament of Treitz. No small bowel wall thickening. No small bowel dilatation. The terminal ileum is normal. The appendix is not visualized, but there is no edema or inflammation in the region of the cecum. Diverticuli are seen scattered along the entire length of the colon without CT findings of diverticulitis.  Vascular/Lymphatic: No abdominal aortic aneurysm. No abdominal atherosclerotic calcification. There is no gastrohepatic or hepatoduodenal ligament lymphadenopathy. No intraperitoneal or retroperitoneal lymphadenopy. No pelvic sidewall lymphadenopathy.  Reproductive: Uterus is unremarkable aside from displacement by the large pelvic mass. 23.4 x 11.6 x 25.8 cm multiloculated complex cystic lesion is identified in the anterior aspect of the central pelvis. This mass appears to be distinct from both the uterus and left ovary in the right gonadal vasculature tracks into the region of this mass. Imaging features are highly suspicious for cystic ovarian neoplasm.  Other: Trace intraperitoneal free fluid is seen in the cul-de-sac and adjacent to the liver tip.  Musculoskeletal: Bone windows reveal no worrisome lytic or sclerotic osseous lesions.  IMPRESSION: 1. Complex cystic central pelvic mass as seen previously. 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 2.  Prominent right gonadal vasculature. 3. Small hiatal hernia. 4. Tiny hypodensity in the dome of the liver is too small to characterize, but is unchanged since 04/07/2015. 5. Trace intraperitoneal free fluid including adjacent to the right liver and  in the central pelvis.   Electronically Signed  By: Misty Stanley M.D.  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 Persistent post operative elevation in CA-125 at 304.9 U/ml, normal post-op CEA Oxaliplatin reaction Thrombocytopenia, chemotherapy induced Persistent nausea, chemotherapy induced  Overall she is doing well with therapy. We discussed her nausea and other options for therapy, she notes that she feels she is managing it well. We will continue to monitor this moving forward.  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 In regards to platelets, her 5-FU has already been reduced. I have dose reduced her oxaliplatin by 10%. I would proceed with treatment today and will make further recommendations based upon labs and symptoms at her next 2 weeks follow-up.   CA-125 will continue to be monitored. Colonoscopy is planned for after completion of her chemotherapy.   No problem-specific assessment & plan notes found for this encounter.  Orders Placed This Encounter  Procedures  . CA 125    Standing Status: Standing     Number of Occurrences: 3     Standing Expiration Date: 09/29/2016  . CEA    Standing Status: Standing     Number of Occurrences: 3     Standing Expiration Date: 09/29/2016   All questions were answered. The patient knows to call the clinic with any problems, questions or concerns. We can certainly see the patient much sooner if necessary. . This document serves as a record of services personally performed by Ancil Linsey, MD. It was created on her  behalf by Toni Amend, a trained medical scribe. The creation of this record is based on the scribe's personal observations and the provider's statements to them. This document has been checked and approved by the attending provider.  I have reviewed the above documentation for accuracy and completeness, and I agree with the above.  This note was electronically signed. Molli Hazard, MD  09/30/2015

## 2015-09-30 NOTE — Patient Instructions (Addendum)
[facility] Discharge Instructions for Patients Receiving Chemotherapy   Beginning January 23rd 2017 lab work for the [facility] will be done in the  Main lab at [facility] on 1st floor. If you have a lab appointment with the [facility] please come in thru the  Main Entrance and check in at the main information desk   Today you received the following chemotherapy agents leucovorin, 5 fu, and oxaliplatin  To help prevent nausea and vomiting after your treatment, we encourage you to take your nausea medication    If you develop nausea and vomiting, or diarrhea that is not controlled by your medication, call the clinic.  The clinic phone number is [phone]. Office hours are Monday-Friday 8:30am-5:00pm.  BELOW ARE SYMPTOMS THAT SHOULD BE REPORTED IMMEDIATELY:  *FEVER GREATER THAN 101.0 F  *CHILLS WITH OR WITHOUT FEVER  NAUSEA AND VOMITING THAT IS NOT CONTROLLED WITH YOUR NAUSEA MEDICATION  *UNUSUAL SHORTNESS OF BREATH  *UNUSUAL BRUISING OR BLEEDING  TENDERNESS IN MOUTH AND THROAT WITH OR WITHOUT PRESENCE OF ULCERS  *URINARY PROBLEMS  *BOWEL PROBLEMS  UNUSUAL RASH Items with * indicate a potential emergency and should be followed up as soon as possible. If you have an emergency after office hours please contact your primary care physician or go to the nearest emergency department.  Please call the clinic during office hours if you have any questions or concerns.   You may also contact the Patient Navigator at [phone] should you have any questions or need assistance in obtaining follow up care.      Resources For Cancer Patients and their Caregivers ? American Cancer Society: Can assist with transportation, wigs, general needs, runs Look Good Feel Better.        650-801-6330 ? Cancer Care: Provides financial assistance, online support groups, medication/co-pay assistance.  1-800-813-HOPE 314-792-9586) ? [place] Assists [place] cancer patients and their families through emotional , educational and financial support.  ([phone] ? [place] DSS Where to apply for food stamps, Medicaid and utility assistance. ([phone] ? [employer]: Transportation to medical appointments. 418-542-6732 ? Social Security Administration: May apply for disability if have a Stage IV cancer. [phone] ? [employer], Disability and Transit Services: Assists with nutrition, care and transit needs. [phone]

## 2015-09-30 NOTE — Patient Instructions (Addendum)
[place] at [facility] Discharge Instructions  RECOMMENDATIONS MADE BY THE CONSULTANT AND ANY TEST RESULTS WILL BE SENT TO YOUR REFERRING PHYSICIAN.    Exam and discussion by Dr Whitney Muse today Try crystallized ginger for your nausea, you can get this at the health food store Return Thursday to have your pump removed  Return to see the doctor in 2 weeks and for chemotherapy Please call the clinic if you have any questions or concerns    Thank you for choosing [place] at [facility] to provide your oncology and hematology care.  To afford each patient quality time with our provider, please arrive at least 15 minutes before your scheduled appointment time.   Beginning January 23rd 2017 lab work for the [facility] will be done in the  Main lab at [facility] on 1st floor. If you have a lab appointment with the [facility] please come in thru the  Main Entrance and check in at the main information desk  You need to re-schedule your appointment should you arrive 10 or more minutes late.  We strive to give you quality time with our providers, and arriving late affects you and other patients whose appointments are after yours.  Also, if you no show three or more times for appointments you may be dismissed from the clinic at the providers discretion.     Again, thank you for choosing [facility].  Our hope is that these requests will decrease the amount of time that you wait before being seen by our physicians.       _____________________________________________________________  Should you have questions after your visit to [facility], please contact our office at [phone] between the hours of 8:30 a.m. and 4:30 p.m.  Voicemails left after 4:30 p.m. will not be returned until the following business day.  For prescription refill requests, have your pharmacy contact our office.         Resources For Cancer  Patients and their Caregivers ? American Cancer Society: Can assist with transportation, wigs, general needs, runs Look Good Feel Better.        [phone] [phone] ? Cancer Care: Provides financial assistance, online support groups, medication/co-pay assistance.  1-800-813-HOPE 612-577-1603) ? [place] Assists [place] cancer patients and their families through emotional , educational and financial support.  ([phone] ? [place] DSS Where to apply for food stamps, Medicaid and utility assistance. 412-356-8151 ? [employer]ATS: Transportation to medical appointments. ([phone] ? Social Security Administration: May apply for disability if have a Stage IV cancer. [phone] [phone] ? [employer], Disability and Transit Services: Assists with nutrition, care and transit needs. [phone]

## 2015-10-01 LAB — CEA: CEA: 4 ng/mL (ref 0.0–4.7)

## 2015-10-01 LAB — CA 125: CA 125: 17.3 U/mL (ref 0.0–38.1)

## 2015-10-02 ENCOUNTER — Encounter (HOSPITAL_COMMUNITY): Payer: Self-pay

## 2015-10-02 ENCOUNTER — Encounter (HOSPITAL_BASED_OUTPATIENT_CLINIC_OR_DEPARTMENT_OTHER): Payer: BLUE CROSS/BLUE SHIELD

## 2015-10-02 VITALS — BP 131/52 | HR 69 | Temp 98.0°F | Resp 16

## 2015-10-02 DIAGNOSIS — C801 Malignant (primary) neoplasm, unspecified: Secondary | ICD-10-CM | POA: Diagnosis not present

## 2015-10-02 MED ORDER — LORAZEPAM 2 MG/ML IJ SOLN
INTRAMUSCULAR | Status: AC
Start: 2015-10-02 — End: 2015-10-02
  Filled 2015-10-02: qty 1

## 2015-10-02 MED ORDER — HEPARIN SOD (PORK) LOCK FLUSH 100 UNIT/ML IV SOLN
INTRAVENOUS | Status: AC
  Filled 2015-10-02: qty 5

## 2015-10-02 MED ORDER — LORAZEPAM 2 MG/ML IJ SOLN: 0.5000 mg | Freq: Once | INTRAMUSCULAR | Status: DC

## 2015-10-02 MED ORDER — SODIUM CHLORIDE 0.9% FLUSH
10.0000 mL | INTRAVENOUS | Status: DC | PRN
  Administered 2015-10-02: 10 mL
  Filled 2015-10-02: qty 10

## 2015-10-02 MED ORDER — HEPARIN SOD (PORK) LOCK FLUSH 100 UNIT/ML IV SOLN
500.0000 [IU] | Freq: Once | INTRAVENOUS | Status: AC | PRN
  Administered 2015-10-02: 500 [IU]

## 2015-10-02 NOTE — Progress Notes (Signed)
Natalie Ball returns today for port de access and flush after 46 hr continous infusion of 32fu. Tolerated infusion without problems. She chooses to forego ativan today. She has taken zofran, nausea is under control and she states (don't blitz me, I want to go shopping) Portacath located lt chest wall was  deaccessed and flushed with 67ml NS and 500U/78ml Heparin and needle removed intact.  Procedure without incident. Patient tolerated procedure well.

## 2015-10-08 ENCOUNTER — Ambulatory Visit (HOSPITAL_COMMUNITY): Payer: BLUE CROSS/BLUE SHIELD | Admitting: Hematology & Oncology

## 2015-10-13 NOTE — Progress Notes (Signed)
[facility], MD [place] [address]  Mucinous adenocarcinoma [facility])  CURRENT THERAPY: FOLFOX beginning on 07/22/2015  INTERVAL HISTORY: Natalie Ball 65 y.o. 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 [name] on 06/26/2015 after findings a large abdominopelvic mass by Gynecologist, Dr. Evie Lacks. It is dictated that the patient had a pre-operative elevation of CA 125 and CEA. Began systemic chemotherapy in the adjuvant setting on 07/22/2015.    Mucinous adenocarcinoma ([facility])   06/24/2015 Imaging CT CAP- Complex cystic central pelvic mass remains suspicious for cystic ovarian carcinoma, most likely arising from the right ovary. Prominent right gonadal vasculature. Tiny hypodensity in the dome of the liver is too small to characterize, but stable.   06/26/2015 Pathology Results Adnexa - ovary +/- tube, neoplastic, right - MUCINOUS ADENOCARCINOMA, 13 CM. - OVARIAN SURFACE NOT INVOLVED BY TUMOR. - UNREMARKABLE FALLOPIAN TUBE WITH NO ENDOMETRIOSIS OR MALIGNANCY.   06/26/2015 Procedure Exploratory laparotomy with total hysterectomy bilateral salpingo-oophorectomy, pelvic and para-aortic lymph node dissection, omentectomy washings and biopsies for ovarian cancer by Dr. [name]   06/27/2015 Pathology Results PERITONEAL WASHING(SPECIMEN 1 OF 1 COLLECTED 06/27/15): ATYPICAL CELLS. Immunohistochemistry calretinin, cytokeratin 5/6, estrogen receptor, progesterone receptor and WT-1 is performed. The morphology and immunophenotype favor reactive mesothelial cells   07/10/2015 Initial Diagnosis Mucinous adenocarcinoma ([facility])   07/22/2015 -  Chemotherapy FOLFOX   07/24/2015 Survivorship Genetic Counseling consultation with [name].   07/26/2015 Adverse Reaction Loose stools, 2-3 per 24 hours   07/29/2015 Treatment Plan Change 5FU bolus discontinued due to loose  stools.   08/05/2015 Adverse Reaction Reaction to Oxaliplatin   09/01/2015 Treatment Plan Change Decrease 5FU CI by 10%   09/30/2015 Treatment Plan Change Oxaliplatin reduced by 10%    I personally reviewed and went over laboratory results with the patient.  The results are noted within this dictation.  She notes and epigastric discomfort. She describes it as an "empty feeling".  She reports that causes her the sensation of nausea. She denies any vomiting. She denies any heaving, or retching. She reports that it does not change with food. She denies any changes with foods. She does note that it improves when consuming lemon/salt. She avoids anti-emetics as they make her sleepy. She will try Maalox/Mylanta at home to see if this is effective for her. If this continues, she may need a referral to GI for consideration of EGD.   Past Medical History  Diagnosis Date  . Allergy   . GERD (gastroesophageal reflux disease)   . Complication of anesthesia   . PONV (postoperative nausea and vomiting)   . Wears glasses   . Tinnitus   . Vertigo   . Pneumonia   . History of bronchitis   . Urinary urgency   . Headache   . Mucinous adenocarcinoma ([place]) 07/10/2015  . Family history of ovarian cancer   . Family history of pancreatic cancer   . Family history of colon cancer     has Mucinous adenocarcinoma ([place]); Family history of ovarian cancer; Family history of pancreatic cancer; Family history of colon cancer; Genetic testing; and Nausea without vomiting on her problem list.     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  Current Outpatient Prescriptions on File Prior to Visit  Medication Sig Dispense Refill  . acetaminophen (TYLENOL) 500 MG tablet Take 500 mg by mouth every 6 (six) hours as needed.    Marland Kitchen  bisacodyl (DULCOLAX) 10 MG suppository Place 10 mg rectally as needed for moderate constipation. Reported on 07/29/2015    . dextrose 5 % SOLN 1,000 mL with fluorouracil 5 GM/100ML SOLN Inject into the vein. To infuse  over 46 hours every 14 days    . docusate sodium (COLACE) 100 MG capsule Take 200 mg by mouth daily. Reported on 08/19/2015    . fluticasone (FLONASE) 50 MCG/ACT nasal spray Place 1 spray into both nostrils daily.    Marland Kitchen HYDROcodone-acetaminophen (NORCO/VICODIN) 5-325 MG tablet Take 1 tablet by mouth every 4 (four) hours as needed for moderate pain. (Patient not taking: Reported on 08/19/2015) 30 tablet 0  . ibuprofen (ADVIL,MOTRIN) 200 MG tablet Take 400 mg by mouth every 6 (six) hours as needed for headache or moderate pain. Reported on 07/14/2015    . leucovorin in dextrose 5 % 250 mL Inject into the vein once. Every 14 days    . lidocaine-prilocaine (EMLA) cream Apply a quarter size amount to port site 1 hour prior to chemo. Do not rub in. Cover with plastic wrap. 30 g 3  . loperamide (IMODIUM) 2 MG capsule Take 2 mg by mouth as needed for diarrhea or loose stools. Reported on 08/19/2015    . loratadine (CLARITIN) 10 MG tablet Take 10 mg by mouth daily as needed for allergies.    Marland Kitchen LORazepam (ATIVAN) 0.5 MG tablet Take 1 tablet (0.5 mg total) by mouth every 8 (eight) hours. 30 tablet 2  . magic mouthwash w/lidocaine SOLN Swish and swallow 1 teaspoonful four times a day. (Patient not taking: Reported on 08/19/2015) 360 mL 1  . nystatin (MYCOSTATIN) 100000 UNIT/ML suspension Reported on 08/19/2015  0  . omeprazole (PRILOSEC) 20 MG capsule Take 1 capsule (20 mg total) by mouth daily. 30 capsule 2  . ondansetron (ZOFRAN) 8 MG tablet Take 1 tablet (8 mg total) by mouth every 8 (eight) hours as needed for nausea or vomiting. 30 tablet 3  . OXALIPLATIN IV Inject into the vein. Every 14 days    . oxyCODONE-acetaminophen (PERCOCET/ROXICET) 5-325 MG tablet Take 1 tablet by mouth every 6 (six) hours as needed for severe pain. Reported on 08/19/2015    . Papaya Enzyme CHEW Chew 1 tablet by mouth daily. For digestive health    . prochlorperazine (COMPAZINE) 10 MG tablet Take 1 tablet (10 mg total) by mouth every 6 (six)  hours as needed for nausea or vomiting. 30 tablet 3   Current Facility-Administered Medications on File Prior to Visit  Medication Dose Route Frequency Provider Last Rate Last Dose  . dexamethasone (DECADRON) 8 mg in sodium chloride 0.9 % 50 mL IVPB  8 mg Intravenous Once Manha Amato S Murat Rideout, PA-C      . dexamethasone (DECADRON) 8 mg in sodium chloride 0.9 % 50 mL IVPB  8 mg Intravenous Once Khale Nigh S Taaliyah Delpriore, PA-C      . dextrose 5 % solution   Intravenous Once Patrici Ranks, MD      . fosaprepitant (EMEND) 150 mg, dexamethasone (DECADRON) 12 mg in sodium chloride 0.9 % 145 mL IVPB   Intravenous Once Patrici Ranks, MD      . LORazepam (ATIVAN) injection 0.5 mg  0.5 mg Intravenous Once Patrici Ranks, MD      . palonosetron (ALOXI) injection 0.25 mg  0.25 mg Intravenous Once Patrici Ranks, MD      . prochlorperazine (COMPAZINE) injection 10 mg  10 mg Intravenous Once [employer], PA-C      .  prochlorperazine (COMPAZINE) injection 10 mg  10 mg Intravenous Once [employer], PA-C      . sodium chloride flush (NS) 0.9 % injection 10 mL  10 mL Intracatheter PRN Patrici Ranks, MD        Past Surgical History  Procedure Laterality Date  . Tubal ligation  1974  . Diagnostic laparoscopy  1977  . Rectal fusula  1980  . Appendectomy    . Laparotomy N/A 06/26/2015    Procedure: EXPLORATORY LAPAROTOMY;  Surgeon: [name], MD;  Location: [facility];  Service: Gynecology;  Laterality: N/A;  . Abdominal hysterectomy N/A 06/26/2015    Procedure: TOTAL HYSTERECTOMY ABDOMINAL;  Surgeon: [name], MD;  Location: [facility];  Service: Gynecology;  Laterality: N/A;  . Salpingoophorectomy Bilateral 06/26/2015    Procedure: SALPINGO OOPHORECTOMY;  Surgeon: [name], MD;  Location: [facility];  Service: Gynecology;  Laterality: Bilateral;  . Omentectomy N/A 06/26/2015    Procedure:  OMENTECTOMY;  Surgeon: [name], MD;  Location: [facility];  Service: Gynecology;  Laterality: N/A;  .  [place] N/A 06/26/2015    Procedure: bilateral periaortic lymph node dissection biopsies and peritoneal washings;  Surgeon: [name], MD;  Location: [facility];  Service: Gynecology;  Laterality: N/A;  . Portacath placement Left 07/18/2015    Procedure: INSERTION PORT-A-CATH;  Surgeon: Aviva Signs, MD;  Location: [facility];  Service: General;  Laterality: Left;  pt knows to arrive at 6:15    Denies any headaches, dizziness, double vision, fevers, chills, night sweats, nausea, vomiting, diarrhea, constipation, chest pain, heart palpitations, shortness of breath, blood in stool, black tarry stool, urinary pain, urinary burning, urinary frequency, hematuria.   PHYSICAL EXAMINATION  ECOG PERFORMANCE STATUS: 0 - Asymptomatic  There were no vitals filed for this visit.  GENERAL:alert, no distress, well nourished, well developed, comfortable, cooperative, smiling and accompanied by her daughter, in chemotherapy recliner SKIN: skin color, texture, turgor are normal HEAD: Normocephalic, No masses, lesions, tenderness or abnormalities EYES: normal, EOMI, Conjunctiva are pink and non-injected EARS: External ears normal OROPHARYNX:lips, buccal mucosa, and tongue normal and mucous membranes are moist  NECK: supple, trachea midline LYMPH:  no palpable lymphadenopathy BREAST:not examined LUNGS: clear to auscultation without wheezes, rales, or rhonchi. HEART: regular rate & rhythm, no murmurs, no gallops, S1 normal and S2 normal ABDOMEN:abdomen soft, non-tender and normal bowel sounds. Minimal tenderness of palpation in epigastric area. BACK: Back symmetric, no curvature. EXTREMITIES:less then 2 second capillary refill, no joint deformities, effusion, or inflammation, no skin discoloration, no cyanosis  NEURO: alert & oriented x 3 with fluent speech, no focal motor/sensory deficits   LABORATORY DATA: CBC    Component Value Date/Time   WBC 3.9* 10/14/2015 0858   RBC 3.62* 10/14/2015 0858   HGB  11.8* 10/14/2015 0858   HCT 34.7* 10/14/2015 0858   PLT 106* 10/14/2015 0858   MCV 95.9 10/14/2015 0858   MCH 32.6 10/14/2015 0858   MCHC 34.0 10/14/2015 0858   RDW 16.3* 10/14/2015 0858   LYMPHSABS 1.0 10/14/2015 0858   MONOABS 0.6 10/14/2015 0858   EOSABS 0.1 10/14/2015 0858   BASOSABS 0.0 10/14/2015 0858      Chemistry      Component Value Date/Time   NA 140 10/14/2015 0858   NA 141 06/20/2015 1121   K 3.8 10/14/2015 0858   K 4.1 06/20/2015 1121   CL 108 10/14/2015 0858   CO2 24 10/14/2015 0858   CO2 24 06/20/2015 1121   BUN 14 10/14/2015 0858  BUN 14.7 06/20/2015 1121   CREATININE 0.58 10/14/2015 0858   CREATININE 0.8 06/20/2015 1121      Component Value Date/Time   CALCIUM 9.4 10/14/2015 0858   CALCIUM 9.2 06/20/2015 1121   ALKPHOS 127* 10/14/2015 0858   AST 44* 10/14/2015 0858   ALT 66* 10/14/2015 0858   BILITOT 0.8 10/14/2015 0858     Lab Results  Component Value Date   CEA 4.0 09/30/2015   Lab Results  Component Value Date   CA125 17.3 09/30/2015     PENDING LABS:   RADIOGRAPHIC STUDIES:  No results found.   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 [name] on 06/26/2015 after findings a large abdominopelvic mass by Gynecologist, Dr. Evie Lacks. It is dictated that the patient had a pre-operative elevation of CA 125 and CEA.  Began systemic chemotherapy in the adjuvant setting on 07/22/2015 consisting of FOLFOX, complicated by allergic reaction to Oxaliplatin on cycle #2, resulting in the need for oxaliplatin desensitization.  Oncology history is updated.  Labs today: CBC diff, CMET.  CEA and CA-125 every 6 weeks.  She complains of epigastric discomfort that improves/resolves with lemon and salt.  She denies any vomiting. Weight is very stable  overall.  She thinks it is from nausea.  She takes an intermittent Ativan at HS when she has nausea.  Otherwise, she avoids antiemetics because they make her fatigued and tired.  She is going to try OTC Maalox/Mylanta that she has at home to see if this helps.  She is back to working part-time.  Return in 2 weeks with next treatment.    THERAPY PLAN:  Complete adjuvant FOLFOX, followed by a colonoscopy by Dr. Britta Mccreedy (GI) in [employer].  All questions were answered. The patient knows to call the clinic with any problems, questions or concerns. We can certainly see the patient much sooner if necessary.  Patient and plan discussed with Dr. Ancil Linsey and she is in agreement with the aforementioned.   This note is electronically signed by: Doy Mince 10/14/2015 9:44 AM

## 2015-10-13 NOTE — Assessment & Plan Note (Addendum)
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 [name] on 06/26/2015 after findings a large abdominopelvic mass by Gynecologist, Dr. Evie Lacks. It is dictated that the patient had a pre-operative elevation of CA 125 and CEA.  Began systemic chemotherapy in the adjuvant setting on 07/22/2015 consisting of FOLFOX, complicated by allergic reaction to Oxaliplatin on cycle #2, resulting in the need for oxaliplatin desensitization.  Oncology history is updated.  Labs today: CBC diff, CMET.  CEA and CA-125 every 6 weeks.  She complains of epigastric discomfort that improves/resolves with lemon and salt.  She denies any vomiting. Weight is very stable overall.  She thinks it is from nausea.  She takes an intermittent Ativan at HS when she has nausea.  Otherwise, she avoids antiemetics because they make her fatigued and tired.  She is going to try OTC Maalox/Mylanta that she has at home to see if this helps.  She is back to working part-time.  Return in 2 weeks with next treatment.

## 2015-10-14 ENCOUNTER — Inpatient Hospital Stay (HOSPITAL_COMMUNITY): Payer: BLUE CROSS/BLUE SHIELD

## 2015-10-14 ENCOUNTER — Encounter (HOSPITAL_COMMUNITY): Payer: Self-pay

## 2015-10-14 ENCOUNTER — Encounter (HOSPITAL_COMMUNITY): Payer: BLUE CROSS/BLUE SHIELD | Attending: Hematology & Oncology

## 2015-10-14 ENCOUNTER — Encounter (HOSPITAL_BASED_OUTPATIENT_CLINIC_OR_DEPARTMENT_OTHER): Payer: BLUE CROSS/BLUE SHIELD | Admitting: Oncology

## 2015-10-14 ENCOUNTER — Ambulatory Visit (HOSPITAL_COMMUNITY): Payer: BLUE CROSS/BLUE SHIELD | Admitting: Hematology & Oncology

## 2015-10-14 VITALS — BP 112/55 | HR 70 | Temp 98.0°F | Resp 18 | Wt 127.7 lb

## 2015-10-14 DIAGNOSIS — C801 Malignant (primary) neoplasm, unspecified: Secondary | ICD-10-CM

## 2015-10-14 DIAGNOSIS — Z5111 Encounter for antineoplastic chemotherapy: Secondary | ICD-10-CM

## 2015-10-14 DIAGNOSIS — R110 Nausea: Secondary | ICD-10-CM

## 2015-10-14 LAB — CBC WITH DIFFERENTIAL/PLATELET
BASOS ABS: 0 10*3/uL (ref 0.0–0.1)
BASOS PCT: 1 %
Eosinophils Absolute: 0.1 10*3/uL (ref 0.0–0.7)
Eosinophils Relative: 1 %
HEMATOCRIT: 34.7 % — AB (ref 36.0–46.0)
HEMOGLOBIN: 11.8 g/dL — AB (ref 12.0–15.0)
Lymphocytes Relative: 25 %
Lymphs Abs: 1 10*3/uL (ref 0.7–4.0)
MCH: 32.6 pg (ref 26.0–34.0)
MCHC: 34 g/dL (ref 30.0–36.0)
MCV: 95.9 fL (ref 78.0–100.0)
Monocytes Absolute: 0.6 10*3/uL (ref 0.1–1.0)
Monocytes Relative: 16 %
NEUTROS PCT: 58 %
Neutro Abs: 2.3 10*3/uL (ref 1.7–7.7)
Platelets: 106 10*3/uL — ABNORMAL LOW (ref 150–400)
RBC: 3.62 MIL/uL — AB (ref 3.87–5.11)
RDW: 16.3 % — ABNORMAL HIGH (ref 11.5–15.5)
WBC: 3.9 10*3/uL — AB (ref 4.0–10.5)

## 2015-10-14 LAB — COMPREHENSIVE METABOLIC PANEL
ALBUMIN: 3.9 g/dL (ref 3.5–5.0)
ALK PHOS: 127 U/L — AB (ref 38–126)
ALT: 66 U/L — ABNORMAL HIGH (ref 14–54)
AST: 44 U/L — AB (ref 15–41)
Anion gap: 8 (ref 5–15)
BILIRUBIN TOTAL: 0.8 mg/dL (ref 0.3–1.2)
BUN: 14 mg/dL (ref 6–20)
CALCIUM: 9.4 mg/dL (ref 8.9–10.3)
CO2: 24 mmol/L (ref 22–32)
Chloride: 108 mmol/L (ref 101–111)
Creatinine, Ser: 0.58 mg/dL (ref 0.44–1.00)
GFR calc Af Amer: >60 mL/min (ref >60)
GFR calc non Af Amer: >60 mL/min (ref >60)
GLUCOSE: 98 mg/dL (ref 65–99)
Potassium: 3.8 mmol/L (ref 3.5–5.1)
Sodium: 140 mmol/L (ref 135–145)
Total Protein: 6.9 g/dL (ref 6.5–8.1)

## 2015-10-14 MED ORDER — DEXTROSE 5 % IV SOLN
Freq: Once | INTRAVENOUS | Status: AC
  Administered 2015-10-14: 11:00:00 via INTRAVENOUS

## 2015-10-14 MED ORDER — LEUCOVORIN CALCIUM INJECTION 350 MG
400.0000 mg/m2 | Freq: Once | INTRAVENOUS | Status: AC
  Administered 2015-10-14: 652 mg via INTRAVENOUS
  Filled 2015-10-14: qty 32.6

## 2015-10-14 MED ORDER — LORAZEPAM 2 MG/ML IJ SOLN
0.5000 mg | Freq: Once | INTRAMUSCULAR | Status: AC
  Administered 2015-10-14: 0.5 mg via INTRAVENOUS
  Filled 2015-10-14: qty 1

## 2015-10-14 MED ORDER — SODIUM CHLORIDE 0.9 % IV SOLN
Freq: Once | INTRAVENOUS | Status: AC
  Administered 2015-10-14: 11:00:00 via INTRAVENOUS
  Filled 2015-10-14: qty 5

## 2015-10-14 MED ORDER — SODIUM CHLORIDE 0.9 % IV SOLN
2160.0000 mg/m2 | INTRAVENOUS | Status: DC
  Administered 2015-10-14: 3500 mg via INTRAVENOUS
  Filled 2015-10-14: qty 70

## 2015-10-14 MED ORDER — PALONOSETRON HCL INJECTION 0.25 MG/5ML
0.2500 mg | Freq: Once | INTRAVENOUS | Status: AC
  Administered 2015-10-14: 0.25 mg via INTRAVENOUS
  Filled 2015-10-14: qty 5

## 2015-10-14 MED ORDER — DEXTROSE 5 % IV SOLN
75.0000 mg/m2 | Freq: Once | INTRAVENOUS | Status: AC
  Administered 2015-10-14: 120 mg via INTRAVENOUS
  Filled 2015-10-14: qty 24

## 2015-10-14 MED ORDER — SODIUM CHLORIDE 0.9% FLUSH
10.0000 mL | INTRAVENOUS | Status: DC | PRN
  Administered 2015-10-14: 10 mL
  Filled 2015-10-14: qty 10

## 2015-10-14 NOTE — Progress Notes (Signed)
1445:  Tolerated tx w/o adverse reaction.  VSS.  Discharged ambulatory in c/o family for transport home.

## 2015-10-14 NOTE — Patient Instructions (Signed)
[facility] Discharge Instructions for Patients Receiving Chemotherapy   Beginning January 23rd 2017 lab work for the [facility] will be done in the  Main lab at [facility] on 1st floor. If you have a lab appointment with the [facility] please come in thru the  Main Entrance and check in at the main information desk   Today you received the following chemotherapy agents:  Oxaliplatin, Leucovorin, and 5FU.  To help prevent nausea and vomiting after your treatment, we encourage you to take your nausea medication as prescribed.   If you develop nausea and vomiting, or diarrhea that is not controlled by your medication, call the clinic.  The clinic phone number is [phone]. Office hours are Monday-Friday 8:30am-5:00pm.  BELOW ARE SYMPTOMS THAT SHOULD BE REPORTED IMMEDIATELY:  *FEVER GREATER THAN 101.0 F  *CHILLS WITH OR WITHOUT FEVER  NAUSEA AND VOMITING THAT IS NOT CONTROLLED WITH YOUR NAUSEA MEDICATION  *UNUSUAL SHORTNESS OF BREATH  *UNUSUAL BRUISING OR BLEEDING  TENDERNESS IN MOUTH AND THROAT WITH OR WITHOUT PRESENCE OF ULCERS  *URINARY PROBLEMS  *BOWEL PROBLEMS  UNUSUAL RASH Items with * indicate a potential emergency and should be followed up as soon as possible. If you have an emergency after office hours please contact your primary care physician or go to the nearest emergency department.  Please call the clinic during office hours if you have any questions or concerns.   You may also contact the Patient Navigator at [phone] should you have any questions or need assistance in obtaining follow up care.   Resources For Cancer Patients and their Caregivers ? American Cancer Society: Can assist with transportation, wigs, general needs, runs Look Good Feel Better.        ([phone] ? Cancer Care: Provides financial assistance, online support groups, medication/co-pay assistance.  1-800-813-HOPE (260)580-5615) ? [place] Assists [place] cancer patients and their families through emotional , educational and financial support.  586-852-0640 ? [place] DSS Where to apply for food stamps, Medicaid and utility assistance. 781-740-1022 ? [employer]: Transportation to medical appointments. ([phone] ? Social Security Administration: May apply for disability if have a Stage IV cancer. [phone] ? [employer], Disability and Transit Services: Assists with nutrition, care and transit needs. [phone]

## 2015-10-14 NOTE — Patient Instructions (Signed)
[place] at [facility] Discharge Instructions  RECOMMENDATIONS MADE BY THE CONSULTANT AND ANY TEST RESULTS WILL BE SENT TO YOUR REFERRING PHYSICIAN.  Try maalox/mylanta @ home Treatment today pending labs Return in 2 weeks for followup  Thank you for choosing [place] at [facility] to provide your oncology and hematology care.  To afford each patient quality time with our provider, please arrive at least 15 minutes before your scheduled appointment time.   Beginning January 23rd 2017 lab work for the [facility] will be done in the  Main lab at [facility] on 1st floor. If you have a lab appointment with the [facility] please come in thru the  Main Entrance and check in at the main information desk  You need to re-schedule your appointment should you arrive 10 or more minutes late.  We strive to give you quality time with our providers, and arriving late affects you and other patients whose appointments are after yours.  Also, if you no show three or more times for appointments you may be dismissed from the clinic at the providers discretion.     Again, thank you for choosing [facility].  Our hope is that these requests will decrease the amount of time that you wait before being seen by our physicians.       _____________________________________________________________  Should you have questions after your visit to [facility], please contact our office at [phone] between the hours of 8:30 a.m. and 4:30 p.m.  Voicemails left after 4:30 p.m. will not be returned until the following business day.  For prescription refill requests, have your pharmacy contact our office.         Resources For Cancer Patients and their Caregivers ? American Cancer Society: Can assist with transportation, wigs, general needs, runs Look Good Feel Better.        802-051-3875 ? Cancer Care: Provides financial  assistance, online support groups, medication/co-pay assistance.  1-800-813-HOPE 262-060-0916) ? [facility] Assists [place] cancer patients and their families through emotional , educational and financial support.  ([phone] 001-0698 ? [place] DSS Where to apply for food stamps, Medicaid and utility assistance. 760-723-9162 ? RCATS: Transportation to medical appointments. (402)421-6701 ? Social Security Administration: May apply for disability if have a Stage IV cancer. [phone] 2898884311 ? [employer], Disability and Transit Services: Assists with nutrition, care and transit needs. [phone]

## 2015-10-16 ENCOUNTER — Encounter (HOSPITAL_BASED_OUTPATIENT_CLINIC_OR_DEPARTMENT_OTHER): Payer: BLUE CROSS/BLUE SHIELD

## 2015-10-16 ENCOUNTER — Encounter (HOSPITAL_COMMUNITY): Payer: Self-pay

## 2015-10-16 VITALS — BP 112/43 | HR 62 | Temp 98.3°F | Resp 18

## 2015-10-16 DIAGNOSIS — C801 Malignant (primary) neoplasm, unspecified: Secondary | ICD-10-CM

## 2015-10-16 MED ORDER — SODIUM CHLORIDE 0.9% FLUSH
10.0000 mL | INTRAVENOUS | Status: DC | PRN
  Administered 2015-10-16: 10 mL
  Filled 2015-10-16: qty 10

## 2015-10-16 MED ORDER — LORAZEPAM 2 MG/ML IJ SOLN
0.5000 mg | Freq: Once | INTRAMUSCULAR | Status: AC
  Administered 2015-10-16: 0.5 mg via INTRAVENOUS

## 2015-10-16 MED ORDER — HEPARIN SOD (PORK) LOCK FLUSH 100 UNIT/ML IV SOLN
500.0000 [IU] | Freq: Once | INTRAVENOUS | Status: AC | PRN
  Administered 2015-10-16: 500 [IU]
  Filled 2015-10-16: qty 5

## 2015-10-16 MED ORDER — LORAZEPAM 2 MG/ML IJ SOLN
INTRAMUSCULAR | Status: AC
  Filled 2015-10-16: qty 1

## 2015-10-16 NOTE — Progress Notes (Signed)
Natalie Ball returns today for port de access and flush after 46 hr continous infusion of 47fu. Tolerated infusion without problems, however c/o nausea today. Lorazepam given as ordered. Portacath located lt chest wall was  deaccessed and flushed with 75ml NS and 500U/34ml Heparin and needle removed intact.  Procedure without incident. Patient tolerated procedure well and states that she feels better after the lorazepam. Home with boyfriend

## 2015-10-28 ENCOUNTER — Encounter (HOSPITAL_BASED_OUTPATIENT_CLINIC_OR_DEPARTMENT_OTHER): Payer: BLUE CROSS/BLUE SHIELD

## 2015-10-28 ENCOUNTER — Encounter (HOSPITAL_BASED_OUTPATIENT_CLINIC_OR_DEPARTMENT_OTHER): Payer: BLUE CROSS/BLUE SHIELD | Admitting: Hematology & Oncology

## 2015-10-28 ENCOUNTER — Telehealth (HOSPITAL_COMMUNITY): Payer: Self-pay | Admitting: Hematology & Oncology

## 2015-10-28 ENCOUNTER — Encounter (HOSPITAL_COMMUNITY): Payer: Self-pay

## 2015-10-28 ENCOUNTER — Encounter (HOSPITAL_COMMUNITY): Payer: Self-pay | Admitting: Hematology & Oncology

## 2015-10-28 VITALS — BP 116/49 | HR 70 | Temp 97.9°F | Resp 18 | Wt 127.4 lb

## 2015-10-28 DIAGNOSIS — C801 Malignant (primary) neoplasm, unspecified: Secondary | ICD-10-CM

## 2015-10-28 DIAGNOSIS — R110 Nausea: Secondary | ICD-10-CM

## 2015-10-28 DIAGNOSIS — Z5111 Encounter for antineoplastic chemotherapy: Secondary | ICD-10-CM

## 2015-10-28 LAB — COMPREHENSIVE METABOLIC PANEL
ALBUMIN: 3.9 g/dL (ref 3.5–5.0)
ALK PHOS: 160 U/L — AB (ref 38–126)
ALT: 77 U/L — ABNORMAL HIGH (ref 14–54)
ANION GAP: 8 (ref 5–15)
AST: 56 U/L — ABNORMAL HIGH (ref 15–41)
BUN: 18 mg/dL (ref 6–20)
CALCIUM: 9.5 mg/dL (ref 8.9–10.3)
CHLORIDE: 108 mmol/L (ref 101–111)
CO2: 25 mmol/L (ref 22–32)
Creatinine, Ser: 0.62 mg/dL (ref 0.44–1.00)
GFR calc non Af Amer: >60 mL/min (ref >60)
GLUCOSE: 134 mg/dL — AB (ref 65–99)
POTASSIUM: 3.6 mmol/L (ref 3.5–5.1)
SODIUM: 141 mmol/L (ref 135–145)
Total Bilirubin: 0.8 mg/dL (ref 0.3–1.2)
Total Protein: 6.9 g/dL (ref 6.5–8.1)

## 2015-10-28 LAB — CBC WITH DIFFERENTIAL/PLATELET
Basophils Absolute: 0 10*3/uL (ref 0.0–0.1)
Basophils Relative: 1 %
EOS ABS: 0.1 10*3/uL (ref 0.0–0.7)
Eosinophils Relative: 2 %
HCT: 34.6 % — ABNORMAL LOW (ref 36.0–46.0)
HEMOGLOBIN: 11.7 g/dL — AB (ref 12.0–15.0)
LYMPHS ABS: 0.8 10*3/uL (ref 0.7–4.0)
LYMPHS PCT: 25 %
MCH: 33.1 pg (ref 26.0–34.0)
MCHC: 33.8 g/dL (ref 30.0–36.0)
MCV: 97.7 fL (ref 78.0–100.0)
Monocytes Absolute: 0.4 10*3/uL (ref 0.1–1.0)
Monocytes Relative: 12 %
NEUTROS ABS: 2 10*3/uL (ref 1.7–7.7)
NEUTROS PCT: 61 %
Platelets: 117 10*3/uL — ABNORMAL LOW (ref 150–400)
RBC: 3.54 MIL/uL — AB (ref 3.87–5.11)
RDW: 16.2 % — ABNORMAL HIGH (ref 11.5–15.5)
WBC: 3.3 10*3/uL — AB (ref 4.0–10.5)

## 2015-10-28 MED ORDER — OXALIPLATIN CHEMO INJECTION 100 MG/20ML
75.0000 mg/m2 | Freq: Once | INTRAVENOUS | Status: AC
  Administered 2015-10-28: 120 mg via INTRAVENOUS
  Filled 2015-10-28: qty 24

## 2015-10-28 MED ORDER — SODIUM CHLORIDE 0.9% FLUSH
10.0000 mL | INTRAVENOUS | Status: DC | PRN
  Administered 2015-10-28: 10 mL
  Filled 2015-10-28: qty 10

## 2015-10-28 MED ORDER — SODIUM CHLORIDE 0.9 % IV SOLN
2160.0000 mg/m2 | INTRAVENOUS | Status: DC
  Administered 2015-10-28: 3500 mg via INTRAVENOUS
  Filled 2015-10-28: qty 70

## 2015-10-28 MED ORDER — DIPHENHYDRAMINE HCL 50 MG/ML IJ SOLN
INTRAMUSCULAR | Status: AC
  Filled 2015-10-28: qty 1

## 2015-10-28 MED ORDER — SODIUM CHLORIDE 0.9 % IV SOLN
Freq: Once | INTRAVENOUS | Status: AC
  Administered 2015-10-28: 10:00:00 via INTRAVENOUS
  Filled 2015-10-28: qty 5

## 2015-10-28 MED ORDER — HEPARIN SOD (PORK) LOCK FLUSH 100 UNIT/ML IV SOLN: 500.0000 [IU] | Freq: Once | INTRAVENOUS | Status: DC | PRN

## 2015-10-28 MED ORDER — LEUCOVORIN CALCIUM INJECTION 350 MG
400.0000 mg/m2 | Freq: Once | INTRAVENOUS | Status: AC
  Administered 2015-10-28: 652 mg via INTRAVENOUS
  Filled 2015-10-28: qty 32.6

## 2015-10-28 MED ORDER — DIPHENHYDRAMINE HCL 50 MG/ML IJ SOLN
12.5000 mg | Freq: Once | INTRAMUSCULAR | Status: AC
Start: 2015-10-28 — End: 2015-10-28
  Administered 2015-10-28: 12.5 mg via INTRAVENOUS

## 2015-10-28 MED ORDER — METHYLPREDNISOLONE SODIUM SUCC 40 MG IJ SOLR
40.0000 mg | Freq: Once | INTRAMUSCULAR | Status: AC
  Administered 2015-10-28: 40 mg via INTRAVENOUS

## 2015-10-28 MED ORDER — METHYLPREDNISOLONE SODIUM SUCC 40 MG IJ SOLR
INTRAMUSCULAR | Status: AC
  Filled 2015-10-28: qty 1

## 2015-10-28 MED ORDER — LORAZEPAM 2 MG/ML IJ SOLN
0.5000 mg | Freq: Once | INTRAMUSCULAR | Status: AC
  Administered 2015-10-28: 0.5 mg via INTRAVENOUS
  Filled 2015-10-28: qty 1

## 2015-10-28 MED ORDER — DEXTROSE 5 % IV SOLN
Freq: Once | INTRAVENOUS | Status: AC
  Administered 2015-10-28: 10:00:00 via INTRAVENOUS

## 2015-10-28 MED ORDER — PALONOSETRON HCL INJECTION 0.25 MG/5ML
0.2500 mg | Freq: Once | INTRAVENOUS | Status: AC
  Administered 2015-10-28: 0.25 mg via INTRAVENOUS
  Filled 2015-10-28: qty 5

## 2015-10-28 NOTE — Patient Instructions (Signed)
[facility] Discharge Instructions for Patients Receiving Chemotherapy   Beginning January 23rd 2017 lab work for the [facility] will be done in the  Main lab at [facility] on 1st floor. If you have a lab appointment with the [facility] please come in thru the  Main Entrance and check in at the main information desk   Today you received the following chemotherapy agents FOLFOX  To help prevent nausea and vomiting after your treatment, we encourage you to take your nausea medication  The clinic phone number is [phone]. Office hours are Monday-Friday 8:30am-5:00pm.  BELOW ARE SYMPTOMS THAT SHOULD BE REPORTED IMMEDIATELY:  *FEVER GREATER THAN 101.0 F  *CHILLS WITH OR WITHOUT FEVER  NAUSEA AND VOMITING THAT IS NOT CONTROLLED WITH YOUR NAUSEA MEDICATION  *UNUSUAL SHORTNESS OF BREATH  *UNUSUAL BRUISING OR BLEEDING  TENDERNESS IN MOUTH AND THROAT WITH OR WITHOUT PRESENCE OF ULCERS  *URINARY PROBLEMS  *BOWEL PROBLEMS  UNUSUAL RASH Items with * indicate a potential emergency and should be followed up as soon as possible. If you have an emergency after office hours please contact your primary care physician or go to the nearest emergency department.  Please call the clinic during office hours if you have any questions or concerns.   You may also contact the Patient Navigator at [phone] should you have any questions or need assistance in obtaining follow up care.      Resources For Cancer Patients and their Caregivers ? American Cancer Society: Can assist with transportation, wigs, general needs, runs Look Good Feel Better.        (405)572-5377 ? Cancer Care: Provides financial assistance, online support groups, medication/co-pay assistance.  1-800-813-HOPE 910-810-5010) ? [facility] Assists [place] cancer patients and their families through emotional , educational and financial support.  507-749-1264 ? [place]  DSS Where to apply for food stamps, Medicaid and utility assistance. 725-678-5292 ? RCATS: Transportation to medical appointments. 660-565-6051 ? Social Security Administration: May apply for disability if have a Stage IV cancer. [phone] 404-521-4413 ? [employer], Disability and Transit Services: Assists with nutrition, care and transit needs. 916-714-6966

## 2015-10-28 NOTE — Progress Notes (Signed)
Natalie Ball at [place], MD [address] 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 Persistent post operative elevation in CA-125 at 304.9 U/ml, normal post-op CEA  SUMMARY OF ONCOLOGIC HISTORY:   Mucinous adenocarcinoma ([place])   06/24/2015 Imaging CT CAP- Complex cystic central pelvic mass remains suspicious for cystic ovarian carcinoma, most likely arising from the right ovary. Prominent right gonadal vasculature. Tiny hypodensity in the dome of the liver is too small to characterize, but stable.   06/26/2015 Pathology Results Adnexa - ovary +/- tube, neoplastic, right - MUCINOUS ADENOCARCINOMA, 13 CM. - OVARIAN SURFACE NOT INVOLVED BY TUMOR. - UNREMARKABLE FALLOPIAN TUBE WITH NO ENDOMETRIOSIS OR MALIGNANCY.   06/26/2015 Procedure Exploratory laparotomy with total hysterectomy bilateral salpingo-oophorectomy, pelvic and para-aortic lymph node dissection, omentectomy washings and biopsies for ovarian cancer by Dr. [name]   06/27/2015 Pathology Results PERITONEAL WASHING(SPECIMEN 1 OF 1 COLLECTED 06/27/15): ATYPICAL CELLS. Immunohistochemistry calretinin, cytokeratin 5/6, estrogen receptor, progesterone receptor and WT-1 is performed. The morphology and immunophenotype favor reactive mesothelial cells   07/10/2015 Initial Diagnosis Mucinous adenocarcinoma ([place])   07/22/2015 -  Chemotherapy FOLFOX   07/24/2015 Survivorship Genetic Counseling consultation with [name].   07/26/2015 Adverse Reaction Loose stools, 2-3 per 24 hours   07/29/2015 Treatment Plan Change 5FU bolus discontinued due to loose stools.   08/05/2015 Adverse Reaction Reaction to Oxaliplatin   09/01/2015 Treatment Plan Change Decrease 5FU CI by 10%   09/30/2015 Treatment Plan Change Oxaliplatin reduced by 10%    CURRENT THERAPY: FOLFOX  INTERVAL HISTORY: Natalie Ball 65 y.o.  female returns for follow-up of mucinous adenocarcinoma. She is here for Cycle #8 FOLFOX.   Natalie Ball returns to the [facility] today unaccompanied. She is in a therapy room receiving treatment.   She notes that yesterday was one of the best days she's had in a while where she didn't have hardly any nausea at all, but that today is "making up for yesterday."  She will often take zofran or lorazepam, interchangeably. When asked if the zofran works, she remarks that nothing really really helps her nausea.  Her fingers and toes are fine. She notes "it gradually starts to feel better where I can touch cold stuff, but it never completely goes away. But it lessens more and more." She denies that her feet or fingers are numb, and says she can button a shirt okay and feel the floor okay. She says she had a little trouble buttoning one of the cuffs on her sleeves this morning but adds that this button is tricky.  She notes that she wants to get her tongue back, to where she can taste.  When asked what she's up to, she comments that she's gone back to work a couple of days, trying to get back into her activities at church. She mentions Natalie Ball again, and that she's planning on going to the beach in September. She says she's had to go come up and take her life back instead of just sitting back and waiting for it to come. She also notes "people like me, I'm not trying to brag but people like me, so I have to learn how to say no."   She confirms that she has pain medicine, in case she needs it after the Neulasta shot goes off.  When asked how her bowels are doing, she indicates that  as long as she takes her pills she is ok.  She notes that nobody sends her any information about what all of this is costing, and she's wondering about this.   MEDICAL HISTORY: Past Medical History  Diagnosis Date  . Allergy   . GERD (gastroesophageal reflux disease)   . Complication of anesthesia   . PONV  (postoperative nausea and vomiting)   . Wears glasses   . Tinnitus   . Vertigo   . Pneumonia   . History of bronchitis   . Urinary urgency   . Headache   . Mucinous adenocarcinoma ([place]) 07/10/2015  . Family history of ovarian cancer   . Family history of pancreatic cancer   . Family history of colon cancer     has Mucinous adenocarcinoma ([place]); Family history of ovarian cancer; Family history of pancreatic cancer; Family history of colon cancer; Genetic testing; and Nausea without vomiting on her problem list.     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  @MEDADMINPROSE @  SURGICAL HISTORY: Past Surgical History  Procedure Laterality Date  . Tubal ligation  1974  . Diagnostic laparoscopy  1977  . Rectal fusula  1980  . Appendectomy    . Laparotomy N/A 06/26/2015    Procedure: EXPLORATORY LAPAROTOMY;  Surgeon: [name], MD;  Location: [facility];  Service: Gynecology;  Laterality: N/A;  . Abdominal hysterectomy N/A 06/26/2015    Procedure: TOTAL HYSTERECTOMY ABDOMINAL;  Surgeon: [name], MD;  Location: [facility];  Service: Gynecology;  Laterality: N/A;  . Salpingoophorectomy Bilateral 06/26/2015    Procedure: SALPINGO OOPHORECTOMY;  Surgeon: [name], MD;  Location: [facility];  Service: Gynecology;  Laterality: Bilateral;  . Omentectomy N/A 06/26/2015    Procedure:  OMENTECTOMY;  Surgeon: [name], MD;  Location: [facility];  Service: Gynecology;  Laterality: N/A;  . Debulking N/A 06/26/2015    Procedure: bilateral periaortic lymph node dissection biopsies and peritoneal washings;  Surgeon: [name], MD;  Location: [facility];  Service: Gynecology;  Laterality: N/A;  . Portacath placement Left 07/18/2015    Procedure: INSERTION PORT-A-CATH;  Surgeon: Aviva Signs, MD;  Location: [facility];  Service: General;  Laterality: Left;  pt knows to arrive at 6:15    SOCIAL HISTORY: Social History   Social History  . Marital Status: Widowed    Spouse Name: N/A  . Number of Children: 1  . Years of  Education: N/A   Occupational History  . Not on file.   Social History Main Topics  . Smoking status: Former Smoker -- 1.00 packs/day for 19 years    Types: Cigarettes    Quit date: 06/14/1988  . Smokeless tobacco: Never Used  . Alcohol Use: Yes     Comment: occasionally   . Drug Use: No  . Sexual Activity: Not on file   Other Topics Concern  . Not on file   Social History Narrative  reports that she quit smoking about 27 years ago. Her smoking use included Cigarettes. She has a 19 pack-year smoking history. She has never used smokeless tobacco. She reports that she drinks alcohol. She reports that she does not use illicit drugs. She reports that prior to her illness, she enjoyed an intermittent beer or marguirtia. She denies being a heavy drinker of EtOH. She notes that she is religious and she associates herselft with [place]. 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 She is widowed  FAMILY HISTORY: Family History  Problem Relation Age of Onset  .  Cancer Father   . Colon polyps Father   . Ovarian cancer Maternal Aunt   . Colon cancer Maternal Aunt   . Pancreatic cancer Maternal Uncle   . Colon polyps Mother   . Colon polyps Sister   . Cancer Paternal Aunt     NOS  . Heart defect Maternal Grandmother     hole in the heart  . COPD Maternal Grandfather   . Colon cancer Paternal Grandmother     dx possibly under 2  . Mental illness Sister   . Uterine cancer Maternal Aunt     dx possibly in her 78s  . Pancreatic cancer Maternal Aunt   . Cancer Paternal Aunt     NOS  . Cancer Cousin     adrenal cancer dx in his 59s; maternal cousin  . Ovarian cancer Cousin     dx in her 54s; maternal cousin  . Diabetes Daughter   Mother is alive at the age of 10 with heart disease, hypothyroidism, and valgus malformation of her ankles bilaterally inhibiting her ability to ambulate well. Father is 64 years old and alive with HTN and a mild form of dementia.   Sister is 30 yo with GERD Sister is 44 yo who was estranged from the patient until most recently. Sister 76 yo who has some sort of mental disorder requiring medications. She has 1 daughter who is 21 years old with DM and hypothyroidism. She has 1 granddaughter who is 59 yo who is overweight, but otherwise healthy. She is in college in [place], [place].  Review of Systems  Constitutional: Negative for fever, chills, weight loss and malaise/fatigue.  HENT: Negative for congestion, hearing loss, nosebleeds, sore throat and tinnitus.   Eyes: Negative for blurred vision, double vision, pain and discharge.  Respiratory: Negative for cough, hemoptysis, sputum production, shortness of breath and wheezing.   Cardiovascular: Negative for chest pain, palpitations, claudication, leg swelling and PND.  Gastrointestinal: Positive for nausea. Negative for heartburn, vomiting, abdominal pain, diarrhea, constipation, blood in stool and melena.  Feels nauseous all day every day. Nausea medications do not help. Fresca and cantaloupe water helped.  Genitourinary: Negative for dysuria, urgency, frequency and hematuria.  Musculoskeletal: Negative for myalgias, joint pain and falls.  Skin: Negative for itching and rash.  Neurological: Negative for dizziness, tingling, tremors, sensory change, speech change, focal weakness, seizures, loss of consciousness, weakness and headaches.  Endo/Heme/Allergies: Does not bruise/bleed easily.  Psychiatric/Behavioral: Negative for depression, suicidal ideas, memory loss and substance abuse. The patient is not nervous/anxious and does not have insomnia.    14 point review of systems was performed and is negative except as detailed under history of present illness and above   PHYSICAL EXAMINATION  ECOG PERFORMANCE STATUS: 0 - Asymptomatic   Vitals - 1 value per visit 7/34/1936  SYSTOLIC 902  DIASTOLIC 49  Pulse 70  Temperature 97.9  Respirations 18  Weight (lb) 127.4    Height   BMI 21.2  VISIT REPORT     Physical Exam  Constitutional: She is oriented to person, place, and time and well-developed, well-nourished, and in no distress. hair thinning noted Well Groomed HENT:  Head: Normocephalic and atraumatic.  Nose: Nose normal.  Mouth/Throat: Oropharynx is clear and moist. No oropharyngeal exudate.  Eyes: Conjunctivae and EOM are normal. Pupils are equal, round, and reactive to light. Right eye exhibits no discharge. Left eye exhibits no discharge. No scleral icterus.  Neck: Normal range of motion. Neck supple. No tracheal deviation  present. No thyromegaly present.  Cardiovascular: Normal rate, regular rhythm and normal heart sounds.  Exam reveals no gallop and no friction rub.   No murmur heard. Pulmonary/Chest: Effort normal and breath sounds normal. She has no wheezes. She has no rales.  Abdominal: Soft. Bowel sounds are normal. She exhibits no distension and no mass. There is no tenderness. There is no rebound and no guarding.  Musculoskeletal: Normal range of motion. She exhibits no edema.  Lymphadenopathy:    She has no cervical adenopathy.  Neurological: She is alert and oriented to person, place, and time. She has normal reflexes. No cranial nerve deficit. Gait normal. Coordination normal.  Skin: Skin is warm and dry. No rash noted.  Psychiatric: Mood, memory, affect and judgment normal.  Nursing note and vitals reviewed.   LABORATORY DATA: I have reviewed the data as listed.Results for Natalie Ball, Natalie Ball (MRN [id number]) as of 10/28/2015 08:34   Ref. Range 09/30/2015 08:58  CA 125 Latest Ref Range: 0.0-38.1 U/mL 17.3  CEA Latest Ref Range: 0.0-4.7 ng/mL 4.0   Results for Natalie Ball, Natalie Ball (MRN [id number]) as of 10/28/2015 16:17  Ref. Range 10/28/2015 08:30  Sodium Latest Ref Range: 135-145 mmol/L 141  Potassium Latest Ref Range: 3.5-5.1 mmol/L 3.6  Chloride Latest Ref Range: 101-111 mmol/L 108  CO2 Latest Ref Range: 22-32 mmol/L 25  BUN Latest  Ref Range: 6-20 mg/dL 18  Creatinine Latest Ref Range: 0.44-1.00 mg/dL 0.62  Calcium Latest Ref Range: 8.9-10.3 mg/dL 9.5  EGFR (Non-African Amer.) Latest Ref Range: >60 mL/min >60  EGFR (African American) Latest Ref Range: >60 mL/min >60  Glucose Latest Ref Range: 65-99 mg/dL 134 (H)  Anion gap Latest Ref Range: 5-15  8  Alkaline Phosphatase Latest Ref Range: 38-126 U/L 160 (H)  Albumin Latest Ref Range: 3.5-5.0 g/dL 3.9  AST Latest Ref Range: 15-41 U/L 56 (H)  ALT Latest Ref Range: 14-54 U/L 77 (H)  Total Protein Latest Ref Range: 6.5-8.1 g/dL 6.9  Total Bilirubin Latest Ref Range: 0.3-1.2 mg/dL 0.8  WBC Latest Ref Range: 4.0-10.5 K/uL 3.3 (L)  RBC Latest Ref Range: 3.87-5.11 MIL/uL 3.54 (L)  Hemoglobin Latest Ref Range: 12.0-15.0 g/dL 11.7 (L)  HCT Latest Ref Range: 36.0-46.0 % 34.6 (L)  MCV Latest Ref Range: 78.0-100.0 fL 97.7  MCH Latest Ref Range: 26.0-34.0 pg 33.1  MCHC Latest Ref Range: 30.0-36.0 g/dL 33.8  RDW Latest Ref Range: 11.5-15.5 % 16.2 (H)  Platelets Latest Ref Range: 150-400 K/uL 117 (L)  Neutrophils Latest Units: % 61  Lymphocytes Latest Units: % 25  Monocytes Relative Latest Units: % 12  Eosinophil Latest Units: % 2  Basophil Latest Units: % 1  NEUT# Latest Ref Range: 1.7-7.7 K/uL 2.0  Lymphocyte # Latest Ref Range: 0.7-4.0 K/uL 0.8  Monocyte # Latest Ref Range: 0.1-1.0 K/uL 0.4  Eosinophils Absolute Latest Ref Range: 0.0-0.7 K/uL 0.1  Basophils Absolute Latest Ref Range: 0.0-0.1 K/uL 0.0     RADIOGRAPHIC STUDIES: I have personally reviewed the radiological images as listed and agreed with the findings in the report.  Study Result     CLINICAL DATA: Cystic ovarian mass.  EXAM: CT CHEST, ABDOMEN, AND PELVIS WITH CONTRAST  TECHNIQUE: Multidetector CT imaging of the chest, abdomen and pelvis was performed following the standard protocol during bolus administration of intravenous contrast.  CONTRAST: 136m OMNIPAQUE IOHEXOL 300 MG/ML  SOLN  COMPARISON: Pelvic MRI from 06/12/2015. CT chest from 04/07/2015.  FINDINGS: CT CHEST FINDINGS  Mediastinum/Lymph Nodes: There is no axillary lymphadenopathy. No  mediastinal lymphadenopathy. There is no hilar lymphadenopathy. The heart size is normal. No pericardial effusion. The esophagus has normal imaging features. Small hiatal hernia noted.  Lungs/Pleura: No focal airspace consolidation. No pulmonary edema or pleural effusion. 4 mm left lower lobe pulmonary nodule (image 37 series 4) is stable since 04/07/2015. 3 mm right middle lobe nodule on the same image is also stable.  Musculoskeletal: Bone windows reveal no worrisome lytic or sclerotic osseous lesions.  CT ABDOMEN PELVIS FINDINGS  Hepatobiliary: 5 mm hypodensity in the dome of the liver is stable since 04/07/2015. Geographic low attenuation of the liver parenchyma is compatible with fatty deposition. There is no evidence for gallstones, gallbladder wall thickening, or pericholecystic fluid. No intrahepatic or extrahepatic biliary dilation.  Pancreas: No focal mass lesion. No dilatation of the main duct. No intraparenchymal cyst. No peripancreatic edema.  Spleen: No splenomegaly. No focal mass lesion.  Adrenals/Urinary Tract: No adrenal nodule or mass. 7 mm low-density lesion extreme upper pole right kidney too small to characterize but likely a tiny cyst. Left kidney unremarkable. No evidence for hydroureter. Bladder is decompressed.  Stomach/Bowel: Small hiatal hernia. Stomach otherwise unremarkable. Duodenum is normally positioned as is the ligament of Treitz. No small bowel wall thickening. No small bowel dilatation. The terminal ileum is normal. The appendix is not visualized, but there is no edema or inflammation in the region of the cecum. Diverticuli are seen scattered along the entire length of the colon without CT findings of diverticulitis.  Vascular/Lymphatic: No abdominal aortic  aneurysm. No abdominal atherosclerotic calcification. There is no gastrohepatic or hepatoduodenal ligament lymphadenopathy. No intraperitoneal or retroperitoneal lymphadenopy. No pelvic sidewall lymphadenopathy.  Reproductive: Uterus is unremarkable aside from displacement by the large pelvic mass. 23.4 x 11.6 x 25.8 cm multiloculated complex cystic lesion is identified in the anterior aspect of the central pelvis. This mass appears to be distinct from both the uterus and left ovary in the right gonadal vasculature tracks into the region of this mass. Imaging features are highly suspicious for cystic ovarian neoplasm.  Other: Trace intraperitoneal free fluid is seen in the cul-de-sac and adjacent to the liver tip.  Musculoskeletal: Bone windows reveal no worrisome lytic or sclerotic osseous lesions.  IMPRESSION: 1. Complex cystic central pelvic mass as seen previously. 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 2. Prominent right gonadal vasculature. 3. Small hiatal hernia. 4. Tiny hypodensity in the dome of the liver is too small to characterize, but is unchanged since 04/07/2015. 5. Trace intraperitoneal free fluid including adjacent to the right liver and in the central pelvis.   Electronically Signed  By: Misty Stanley M.D.  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 Persistent post operative elevation in CA-125 at 304.9 U/ml, normal post-op CEA Oxaliplatin reaction Thrombocytopenia, chemotherapy induced Persistent nausea, chemotherapy induced  Overall she is doing well with therapy. We discussed her nausea and other options for therapy, she notes that she feels she is managing it well. We will continue to monitor this moving forward.  CA-125 will continue to be monitored.  Colonoscopy is planned for after completion of her chemotherapy.   She will receive neulasta after this cycle given her Nelson today. I am worried she will have dose delay with her next cycle if she does not receive growth factor support.   She does not need any refills at  this time.  She will return in 2 weeks for cycle #9, physical exam and labs.   All questions were answered. The patient knows to call the clinic with any problems, questions or concerns. We can certainly see the patient much sooner if necessary. . This document serves as a record of services personally performed by Natalie Linsey, MD. It was created on her behalf by Toni Amend, a trained medical scribe. The creation of this record is based on the scribe's personal observations and the provider's statements to them. This document has been checked and approved by the attending provider.  I have reviewed the above documentation for accuracy and completeness, and I agree with the above.  This note was electronically signed. Natalie Hazard, MD  10/28/2015

## 2015-10-28 NOTE — Patient Instructions (Signed)
[place] at [facility] Discharge Instructions  RECOMMENDATIONS MADE BY THE CONSULTANT AND ANY TEST RESULTS WILL BE SENT TO YOUR REFERRING PHYSICIAN.   Angie to talk to you about cost of Neulasta.  Return in 2 weeks for chemo/labs (CBC diff/CMP/CEA/CA 125)    Thank you for choosing [place] at [facility] to provide your oncology and hematology care.  To afford each patient quality time with our provider, please arrive at least 15 minutes before your scheduled appointment time.   Beginning January 23rd 2017 lab work for the [facility] will be done in the  Main lab at [facility] on 1st floor. If you have a lab appointment with the [place] please come in thru the  Main Entrance and check in at the main information desk  You need to re-schedule your appointment should you arrive 10 or more minutes late.  We strive to give you quality time with our providers, and arriving late affects you and other patients whose appointments are after yours.  Also, if you no show three or more times for appointments you may be dismissed from the clinic at the providers discretion.     Again, thank you for choosing [facility].  Our hope is that these requests will decrease the amount of time that you wait before being seen by our physicians.       _____________________________________________________________  Should you have questions after your visit to [facility], please contact our office at [phone] between the hours of 8:30 a.m. and 4:30 p.m.  Voicemails left after 4:30 p.m. will not be returned until the following business day.  For prescription refill requests, have your pharmacy contact our office.         Resources For Cancer Patients and their Caregivers ? American Cancer Society: Can assist with transportation, wigs, general needs, runs Look Good Feel Better.        215-228-7421 ? Cancer  Care: Provides financial assistance, online support groups, medication/co-pay assistance.  1-800-813-HOPE (475) 223-4353) ? [place] Assists [place] cancer patients and their families through emotional , educational and financial support.  (939)648-0601 ? [place] DSS Where to apply for food stamps, Medicaid and utility assistance. 702 074 6075 ? RCATS: Transportation to medical appointments. 219-427-4069 ? Social Security Administration: May apply for disability if have a Stage IV cancer. 724 196 3744 2255448420 ? [employer], Disability and Transit Services: Assists with nutrition, care and transit needs. [place] Support Programs: @10RELATIVEDAYS @ > Cancer Support Group  2nd Tuesday of the month 1pm-2pm, Journey Room  > Creative Journey  3rd Tuesday of the month 1130am-1pm, Journey Room  > Look Good Feel Better  1st Wednesday of the month 10am-12 noon, Journey Room (Call [place] to register [phone])

## 2015-10-28 NOTE — Progress Notes (Signed)
1055 pt was complaining of red itching palms.  No other complaints.  Dr Whitney Muse at bedside.  12.5 mg of benadryl given, 40 mg solumedrol given.  Stopped oxaliplatin and leucovorin. Normal saline infusing at this time.  Restarted chemotherapy at 1130.  Pt feeling much better.   1330 58fu pump connected discharged ambulatory

## 2015-10-28 NOTE — Telephone Encounter (Signed)
PC TO [employer] [phone] ?'D IF CPT J2505 REQUIRE ANY AUTH PER CSR PLAN DOES NOT REQUIRE IT.  CALL REF# [id number]/16/201712:11PM

## 2015-10-29 LAB — CEA: CEA: 2.9 ng/mL (ref 0.0–4.7)

## 2015-10-29 LAB — CA 125: CA 125: 18.5 U/mL (ref 0.0–38.1)

## 2015-10-30 ENCOUNTER — Encounter (HOSPITAL_COMMUNITY): Payer: Self-pay

## 2015-10-30 ENCOUNTER — Encounter (HOSPITAL_BASED_OUTPATIENT_CLINIC_OR_DEPARTMENT_OTHER): Payer: BLUE CROSS/BLUE SHIELD

## 2015-10-30 VITALS — BP 116/67 | HR 70 | Temp 98.4°F | Resp 18

## 2015-10-30 DIAGNOSIS — C801 Malignant (primary) neoplasm, unspecified: Secondary | ICD-10-CM | POA: Diagnosis not present

## 2015-10-30 DIAGNOSIS — Z5189 Encounter for other specified aftercare: Secondary | ICD-10-CM

## 2015-10-30 MED ORDER — PEGFILGRASTIM 6 MG/0.6ML ~~LOC~~ PSKT
6.0000 mg | PREFILLED_SYRINGE | Freq: Once | SUBCUTANEOUS | Status: AC
  Administered 2015-10-30: 6 mg via SUBCUTANEOUS

## 2015-10-30 MED ORDER — LORAZEPAM 2 MG/ML IJ SOLN
INTRAMUSCULAR | Status: AC
  Filled 2015-10-30: qty 1

## 2015-10-30 MED ORDER — PEGFILGRASTIM 6 MG/0.6ML ~~LOC~~ PSKT
PREFILLED_SYRINGE | SUBCUTANEOUS | Status: AC
  Filled 2015-10-30: qty 0.6

## 2015-10-30 MED ORDER — HEPARIN SOD (PORK) LOCK FLUSH 100 UNIT/ML IV SOLN
INTRAVENOUS | Status: AC
  Filled 2015-10-30: qty 5

## 2015-10-30 MED ORDER — SODIUM CHLORIDE 0.9% FLUSH
10.0000 mL | INTRAVENOUS | Status: DC | PRN
  Administered 2015-10-30: 10 mL
  Filled 2015-10-30: qty 10

## 2015-10-30 MED ORDER — HEPARIN SOD (PORK) LOCK FLUSH 100 UNIT/ML IV SOLN
500.0000 [IU] | Freq: Once | INTRAVENOUS | Status: AC | PRN
  Administered 2015-10-30: 500 [IU]

## 2015-10-30 MED ORDER — LORAZEPAM 2 MG/ML IJ SOLN: 0.5000 mg | Freq: Once | INTRAMUSCULAR | Status: DC

## 2015-10-30 NOTE — Progress Notes (Signed)
Horton Marshall presented for Portacath de-access and flush.  Proper placement of portacath confirmed by CXR.  Portacath located left  chest wall accessed with  H 20 needle.  Good blood return present. Portacath flushed with 62ml NS and 500U/70ml Heparin and needle removed intact.  Procedure tolerated well and without incident.   Pump removed  .Marland KitchenHulan Amato Manleypresents today for neulasta OBI placement per MD orders. OBI device filled per protocol and placed on left Upper Arm. Needle/catheter placement noted prior to patient leaving. Tolerated without incident and aware of injection to be delivered in  27 hours.

## 2015-10-30 NOTE — Patient Instructions (Signed)
[place] at [facility] Discharge Instructions  RECOMMENDATIONS MADE BY THE CONSULTANT AND ANY TEST RESULTS WILL BE SENT TO YOUR REFERRING PHYSICIAN.  Pump removed neulasta placed on arm.   Follow up as scheduled  Please call the clinic if you have any questions or concerns   Thank you for choosing [place] at [facility] to provide your oncology and hematology care.  To afford each patient quality time with our provider, please arrive at least 15 minutes before your scheduled appointment time.   Beginning January 23rd 2017 lab work for the [facility] will be done in the  Main lab at [facility] on 1st floor. If you have a lab appointment with the [place] please come in thru the  Main Entrance and check in at the main information desk  You need to re-schedule your appointment should you arrive 10 or more minutes late.  We strive to give you quality time with our providers, and arriving late affects you and other patients whose appointments are after yours.  Also, if you no show three or more times for appointments you may be dismissed from the clinic at the providers discretion.     Again, thank you for choosing [facility].  Our hope is that these requests will decrease the amount of time that you wait before being seen by our physicians.       _____________________________________________________________  Should you have questions after your visit to [facility], please contact our office at [phone] between the hours of 8:30 a.m. and 4:30 p.m.  Voicemails left after 4:30 p.m. will not be returned until the following business day.  For prescription refill requests, have your pharmacy contact our office.         Resources For Cancer Patients and their Caregivers ? American Cancer Society: Can assist with transportation, wigs, general needs, runs Look Good Feel Better.         (323) 043-7482 ? Cancer Care: Provides financial assistance, online support groups, medication/co-pay assistance.  1-800-813-HOPE (402)349-1547) ? [place] Assists [place] cancer patients and their families through emotional , educational and financial support.  (989)619-0559 ? [place] DSS Where to apply for food stamps, Medicaid and utility assistance. 570 391 6849 ? RCATS: Transportation to medical appointments. 8318627055 ? Social Security Administration: May apply for disability if have a Stage IV cancer. 3321706461 (307)460-5767 ? [employer], Disability and Transit Services: Assists with nutrition, care and transit needs. [place] Support Programs: @10RELATIVEDAYS @ > Cancer Support Group  2nd Tuesday of the month 1pm-2pm, Journey Room  > Creative Journey  3rd Tuesday of the month 1130am-1pm, Journey Room  > Look Good Feel Better  1st Wednesday of the month 10am-12 noon, Journey Room (Call [place] to register [phone])

## 2015-11-11 ENCOUNTER — Encounter (HOSPITAL_BASED_OUTPATIENT_CLINIC_OR_DEPARTMENT_OTHER): Payer: BLUE CROSS/BLUE SHIELD | Admitting: Hematology & Oncology

## 2015-11-11 ENCOUNTER — Encounter (HOSPITAL_BASED_OUTPATIENT_CLINIC_OR_DEPARTMENT_OTHER): Payer: BLUE CROSS/BLUE SHIELD

## 2015-11-11 ENCOUNTER — Encounter (HOSPITAL_COMMUNITY): Payer: Self-pay | Admitting: Hematology & Oncology

## 2015-11-11 ENCOUNTER — Encounter (HOSPITAL_COMMUNITY): Payer: Self-pay

## 2015-11-11 VITALS — BP 152/63 | HR 74 | Temp 97.8°F | Resp 16 | Wt 127.2 lb

## 2015-11-11 DIAGNOSIS — R0602 Shortness of breath: Secondary | ICD-10-CM | POA: Diagnosis not present

## 2015-11-11 DIAGNOSIS — D696 Thrombocytopenia, unspecified: Secondary | ICD-10-CM

## 2015-11-11 DIAGNOSIS — C801 Malignant (primary) neoplasm, unspecified: Secondary | ICD-10-CM

## 2015-11-11 DIAGNOSIS — D6959 Other secondary thrombocytopenia: Secondary | ICD-10-CM | POA: Diagnosis not present

## 2015-11-11 DIAGNOSIS — R110 Nausea: Secondary | ICD-10-CM

## 2015-11-11 LAB — CBC WITH DIFFERENTIAL/PLATELET
BASOS ABS: 0 10*3/uL (ref 0.0–0.1)
BASOS PCT: 0 %
EOS ABS: 0.1 10*3/uL (ref 0.0–0.7)
EOS PCT: 1 %
HEMATOCRIT: 33.3 % — AB (ref 36.0–46.0)
Hemoglobin: 11 g/dL — ABNORMAL LOW (ref 12.0–15.0)
Lymphocytes Relative: 10 %
Lymphs Abs: 1.3 10*3/uL (ref 0.7–4.0)
MCH: 32.9 pg (ref 26.0–34.0)
MCHC: 33 g/dL (ref 30.0–36.0)
MCV: 99.7 fL (ref 78.0–100.0)
MONO ABS: 0.9 10*3/uL (ref 0.1–1.0)
Monocytes Relative: 7 %
NEUTROS ABS: 10.3 10*3/uL — AB (ref 1.7–7.7)
Neutrophils Relative %: 82 %
PLATELETS: 67 10*3/uL — AB (ref 150–400)
RBC: 3.34 MIL/uL — ABNORMAL LOW (ref 3.87–5.11)
RDW: 16.2 % — AB (ref 11.5–15.5)
WBC: 12.6 10*3/uL — ABNORMAL HIGH (ref 4.0–10.5)

## 2015-11-11 LAB — COMPREHENSIVE METABOLIC PANEL
ALBUMIN: 3.8 g/dL (ref 3.5–5.0)
ALT: 65 U/L — ABNORMAL HIGH (ref 14–54)
AST: 47 U/L — AB (ref 15–41)
Alkaline Phosphatase: 221 U/L — ABNORMAL HIGH (ref 38–126)
Anion gap: 8 (ref 5–15)
BUN: 14 mg/dL (ref 6–20)
CHLORIDE: 106 mmol/L (ref 101–111)
CO2: 25 mmol/L (ref 22–32)
Calcium: 9 mg/dL (ref 8.9–10.3)
Creatinine, Ser: 0.74 mg/dL (ref 0.44–1.00)
GFR calc Af Amer: >60 mL/min (ref >60)
GFR calc non Af Amer: >60 mL/min (ref >60)
GLUCOSE: 100 mg/dL — AB (ref 65–99)
POTASSIUM: 3.8 mmol/L (ref 3.5–5.1)
Sodium: 139 mmol/L (ref 135–145)
Total Bilirubin: 0.8 mg/dL (ref 0.3–1.2)
Total Protein: 6.8 g/dL (ref 6.5–8.1)

## 2015-11-11 MED ORDER — HEPARIN SOD (PORK) LOCK FLUSH 100 UNIT/ML IV SOLN
INTRAVENOUS | Status: AC
  Filled 2015-11-11: qty 5

## 2015-11-11 MED ORDER — HEPARIN SOD (PORK) LOCK FLUSH 100 UNIT/ML IV SOLN
500.0000 [IU] | Freq: Once | INTRAVENOUS | Status: AC
  Administered 2015-11-11: 500 [IU] via INTRAVENOUS

## 2015-11-11 NOTE — Progress Notes (Signed)
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 Persistent post operative elevation in CA-125 at 304.9 U/ml, normal post-op CEA  SUMMARY OF ONCOLOGIC HISTORY:   Mucinous adenocarcinoma ([place])   06/24/2015 Imaging CT CAP- Complex cystic central pelvic mass remains suspicious for cystic ovarian carcinoma, most likely arising from the right ovary. Prominent right gonadal vasculature. Tiny hypodensity in the dome of the liver is too small to characterize, but stable.   06/26/2015 Pathology Results Adnexa - ovary +/- tube, neoplastic, right - MUCINOUS ADENOCARCINOMA, 13 CM. - OVARIAN SURFACE NOT INVOLVED BY TUMOR. - UNREMARKABLE FALLOPIAN TUBE WITH NO ENDOMETRIOSIS OR MALIGNANCY.   06/26/2015 Procedure Exploratory laparotomy with total hysterectomy bilateral salpingo-oophorectomy, pelvic and para-aortic lymph node dissection, omentectomy washings and biopsies for ovarian cancer by Dr. [name]   06/27/2015 Pathology Results PERITONEAL WASHING(SPECIMEN 1 OF 1 COLLECTED 06/27/15): ATYPICAL CELLS. Immunohistochemistry calretinin, cytokeratin 5/6, estrogen receptor, progesterone receptor and WT-1 is performed. The morphology and immunophenotype favor reactive mesothelial cells   07/10/2015 Initial Diagnosis Mucinous adenocarcinoma ([place])   07/22/2015 -  Chemotherapy FOLFOX   07/24/2015 Survivorship Genetic Counseling consultation with [name].   07/26/2015 Adverse Reaction Loose stools, 2-3 per 24 hours   07/29/2015 Treatment Plan Change 5FU bolus discontinued due to loose stools.   08/05/2015 Adverse Reaction Reaction to Oxaliplatin   09/01/2015 Treatment Plan Change Decrease 5FU CI by 10%   09/30/2015 Treatment Plan Change Oxaliplatin reduced by 10%    CURRENT THERAPY: FOLFOX  INTERVAL HISTORY: MADALYNE Ball 65 y.o.  female returns for follow-up of mucinous adenocarcinoma. She is here for Cycle #9 FOLFOX.   Ms. Milham is accompanied by her niece. Presents in treatment chair.   Reports she can eat better than she can drink. She has to make herself drink.  She noticed increased bruising on her arms last week. She also has a large bruise on her left shin.   Denies diarrhea. She does report heartburn managed with a "little pill".  Reports small spurts of tingling in her feet, like after you've been sitting and your legs go to sleep. This tingling goes away quickly. She can feel the ground okay and has no issues walking. She has no issues with her hands unless she touches something cold.  One day she had gone back to work, she admits "working more than I probably should". After work, she stopped by [employer] and experienced shortness of breath. She went home and laid on the bed until the trouble breathing resolved. She believes she pushed herself too much. She is not sure when this happened in relation to treatment. This has only happened one time.  Denies CP, vomiting, diarrhea, mouth sores.   MEDICAL HISTORY: Past Medical History  Diagnosis Date  . Allergy   . GERD (gastroesophageal reflux disease)   . Complication of anesthesia   . PONV (postoperative nausea and vomiting)   . Wears glasses   . Tinnitus   . Vertigo   . Pneumonia   . History of bronchitis   . Urinary urgency   . Headache   . Mucinous adenocarcinoma ([place]) 07/10/2015  . Family history of ovarian cancer   . Family history of pancreatic cancer   . Family history of colon cancer     has Mucinous adenocarcinoma ([place]); Family history of ovarian cancer; Family history  of pancreatic cancer; Family history of colon cancer; Genetic testing; and Nausea without vomiting on her problem list.     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  _0 @  SURGICAL HISTORY: Past Surgical History  Procedure Laterality Date  . Tubal ligation  1974  .  Diagnostic laparoscopy  1977  . Rectal fusula  1980  . Appendectomy    . Laparotomy N/A 06/26/2015    Procedure: EXPLORATORY LAPAROTOMY;  Surgeon: [name], MD;  Location: [facility];  Service: Gynecology;  Laterality: N/A;  . Abdominal hysterectomy N/A 06/26/2015    Procedure: TOTAL HYSTERECTOMY ABDOMINAL;  Surgeon: [name], MD;  Location: [facility];  Service: Gynecology;  Laterality: N/A;  . Salpingoophorectomy Bilateral 06/26/2015    Procedure: SALPINGO OOPHORECTOMY;  Surgeon: [name], MD;  Location: [facility];  Service: Gynecology;  Laterality: Bilateral;  . Omentectomy N/A 06/26/2015    Procedure:  OMENTECTOMY;  Surgeon: [name], MD;  Location: [facility];  Service: Gynecology;  Laterality: N/A;  . Debulking N/A 06/26/2015    Procedure: bilateral periaortic lymph node dissection biopsies and peritoneal washings;  Surgeon: [name], MD;  Location: [facility];  Service: Gynecology;  Laterality: N/A;  . Portacath placement Left 07/18/2015    Procedure: INSERTION PORT-A-CATH;  Surgeon: Aviva Signs, MD;  Location: [facility];  Service: General;  Laterality: Left;  pt knows to arrive at 6:15    SOCIAL HISTORY: Social History   Social History  . Marital Status: Widowed    Spouse Name: N/A  . Number of Children: 1  . Years of Education: N/A   Occupational History  . Not on file.   Social History Main Topics  . Smoking status: Former Smoker -- 1.00 packs/day for 19 years    Types: Cigarettes    Quit date: 06/14/1988  . Smokeless tobacco: Never Used  . Alcohol Use: Yes     Comment: occasionally   . Drug Use: No  . Sexual Activity: Not on file   Other Topics Concern  . Not on file   Social History Narrative  reports that she quit smoking about 27 years ago. Her smoking use included Cigarettes. She has a 19 pack-year smoking history. She has never used smokeless tobacco. She reports that she drinks alcohol. She reports that she does not use illicit drugs. She reports that  prior to her illness, she enjoyed an intermittent beer or marguirtia. She denies being a heavy drinker of EtOH. She notes that she is religious and she associates herselft with [place]. 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 She is widowed  FAMILY HISTORY: Family History  Problem Relation Age of Onset  . Cancer Father   . Colon polyps Father   . Ovarian cancer Maternal Aunt   . Colon cancer Maternal Aunt   . Pancreatic cancer Maternal Uncle   . Colon polyps Mother   . Colon polyps Sister   . Cancer Paternal Aunt     NOS  . Heart defect Maternal Grandmother     hole in the heart  . COPD Maternal Grandfather   . Colon cancer Paternal Grandmother     dx possibly under 105  . Mental illness Sister   . Uterine cancer Maternal Aunt     dx possibly in her 37s  . Pancreatic cancer Maternal Aunt   . Cancer Paternal Aunt     NOS  . Cancer Cousin     adrenal cancer dx in his 21s; maternal cousin  .  Ovarian cancer Cousin     dx in her 82s; maternal cousin  . Diabetes Daughter   Mother is alive at the age of 74 with heart disease, hypothyroidism, and valgus malformation of her ankles bilaterally inhibiting her ability to ambulate well. Father is 3 years old and alive with HTN and a mild form of dementia.  Sister is 28 yo with GERD Sister is 73 yo who was estranged from the patient until most recently. Sister 35 yo who has some sort of mental disorder requiring medications. She has 1 daughter who is 70 years old with DM and hypothyroidism. She has 1 granddaughter who is 56 yo who is overweight, but otherwise healthy. She is in college in [place], [place].  Review of Systems  Constitutional: Negative for fever, chills, weight loss and malaise/fatigue.  HENT: Negative for congestion, hearing loss, nosebleeds, sore throat and tinnitus.   Eyes: Negative for blurred vision, double vision, pain and discharge.  Respiratory: Positive for shortness of breath. Negative  for cough, hemoptysis, sputum production, and wheezing.   One episode of shortness of breath. Cardiovascular: Negative for chest pain, palpitations, claudication, leg swelling and PND.  Gastrointestinal: Positive for heartburn. Negative for nausea, vomiting, abdominal pain, diarrhea, constipation, blood in stool and melena.  Heartburn managed with medication. Genitourinary: Negative for dysuria, urgency, frequency and hematuria.  Musculoskeletal: Negative for myalgias, joint pain and falls.  Skin: Negative for itching and rash.  Neurological: Negative for dizziness, tingling, tremors, sensory change, speech change, focal weakness, seizures, loss of consciousness, weakness and headaches.  Endo/Heme/Allergies: Does bruise easily.  Bruising on both arms and left leg. Psychiatric/Behavioral: Negative for depression, suicidal ideas, memory loss and substance abuse. The patient is not nervous/anxious and does not have insomnia.    14 point review of systems was performed and is negative except as detailed under history of present illness and above   PHYSICAL EXAMINATION  ECOG PERFORMANCE STATUS: 0 - Asymptomatic   Vitals with BMI 11/11/2015  Height   Weight 127 lbs 3 oz  BMI   Systolic 557  Diastolic 63  Pulse 74  Respirations 16    Physical Exam  Constitutional: She is oriented to person, place, and time and well-developed, well-nourished, and in no distress. hair thinning noted Well Groomed HENT:  Head: Normocephalic and atraumatic.  Nose: Nose normal.  Mouth/Throat: Oropharynx is clear and moist. No oropharyngeal exudate.  Eyes: Conjunctivae and EOM are normal. Pupils are equal, round, and reactive to light. Right eye exhibits no discharge. Left eye exhibits no discharge. No scleral icterus.  Neck: Normal range of motion. Neck supple. No tracheal deviation present. No thyromegaly present.  Cardiovascular: Normal rate, regular rhythm and normal heart sounds.  Exam reveals no gallop  and no friction rub.   No murmur heard. Pulmonary/Chest: Effort normal and breath sounds normal. She has no wheezes. She has no rales.  Abdominal: Soft. Bowel sounds are normal. She exhibits no distension and no mass. There is no tenderness. There is no rebound and no guarding.  Musculoskeletal: Normal range of motion. She exhibits no edema.  Lymphadenopathy:    She has no cervical adenopathy.  Neurological: She is alert and oriented to person, place, and time. She has normal reflexes. No cranial nerve deficit. Gait normal. Coordination normal.  Skin: Skin is warm and dry. No rash noted. several echymoses on upper extremites Psychiatric: Mood, memory, affect and judgment normal.  Nursing note and vitals reviewed.   LABORATORY DATA: I have reviewed the data as  listed. Results for NAVIE, LAMOREAUX (MRN [id number]) as of 11/11/2015 09:09  Ref. Range 11/11/2015 08:45  WBC Latest Ref Range: 4.0-10.5 K/uL 12.6 (H)  RBC Latest Ref Range: 3.87-5.11 MIL/uL 3.34 (L)  Hemoglobin Latest Ref Range: 12.0-15.0 g/dL 11.0 (L)  HCT Latest Ref Range: 36.0-46.0 % 33.3 (L)  MCV Latest Ref Range: 78.0-100.0 fL 99.7  MCH Latest Ref Range: 26.0-34.0 pg 32.9  MCHC Latest Ref Range: 30.0-36.0 g/dL 33.0  RDW Latest Ref Range: 11.5-15.5 % 16.2 (H)  Platelets Latest Ref Range: 150-400 K/uL 67 (L)  Neutrophils Latest Units: % 82  Lymphocytes Latest Units: % 10  Monocytes Relative Latest Units: % 7  Eosinophil Latest Units: % 1  Basophil Latest Units: % 0  NEUT# Latest Ref Range: 1.7-7.7 K/uL 10.3 (H)  Lymphocyte # Latest Ref Range: 0.7-4.0 K/uL 1.3  Monocyte # Latest Ref Range: 0.1-1.0 K/uL 0.9  Eosinophils Absolute Latest Ref Range: 0.0-0.7 K/uL 0.1  Basophils Absolute Latest Ref Range: 0.0-0.1 K/uL 0.0   Results for AYLIN, RHOADS (MRN [id number]) as of 11/11/2015 09:30  Ref. Range 11/11/2015 08:45  Sodium Latest Ref Range: 135-145 mmol/L 139  Potassium Latest Ref Range: 3.5-5.1 mmol/L 3.8  Chloride Latest  Ref Range: 101-111 mmol/L 106  CO2 Latest Ref Range: 22-32 mmol/L 25  BUN Latest Ref Range: 6-20 mg/dL 14  Creatinine Latest Ref Range: 0.44-1.00 mg/dL 0.74  Calcium Latest Ref Range: 8.9-10.3 mg/dL 9.0  EGFR (Non-African Amer.) Latest Ref Range: >60 mL/min >60  EGFR (African American) Latest Ref Range: >60 mL/min >60  Glucose Latest Ref Range: 65-99 mg/dL 100 (H)  Anion gap Latest Ref Range: 5-15  8  Alkaline Phosphatase Latest Ref Range: 38-126 U/L 221 (H)  Albumin Latest Ref Range: 3.5-5.0 g/dL 3.8  AST Latest Ref Range: 15-41 U/L 47 (H)  ALT Latest Ref Range: 14-54 U/L 65 (H)  Total Protein Latest Ref Range: 6.5-8.1 g/dL 6.8  Total Bilirubin Latest Ref Range: 0.3-1.2 mg/dL 0.8    RADIOGRAPHIC STUDIES: I have personally reviewed the radiological images as listed and agreed with the findings in the report.  Study Result     CLINICAL DATA: Cystic ovarian mass.  EXAM: CT CHEST, ABDOMEN, AND PELVIS WITH CONTRAST  TECHNIQUE: Multidetector CT imaging of the chest, abdomen and pelvis was performed following the standard protocol during bolus administration of intravenous contrast.  CONTRAST: 171m OMNIPAQUE IOHEXOL 300 MG/ML SOLN  COMPARISON: Pelvic MRI from 06/12/2015. CT chest from 04/07/2015.  FINDINGS: CT CHEST FINDINGS  Mediastinum/Lymph Nodes: There is no axillary lymphadenopathy. No mediastinal lymphadenopathy. There is no hilar lymphadenopathy. The heart size is normal. No pericardial effusion. The esophagus has normal imaging features. Small hiatal hernia noted.  Lungs/Pleura: No focal airspace consolidation. No pulmonary edema or pleural effusion. 4 mm left lower lobe pulmonary nodule (image 37 series 4) is stable since 04/07/2015. 3 mm right middle lobe nodule on the same image is also stable.  Musculoskeletal: Bone windows reveal no worrisome lytic or sclerotic osseous lesions.  CT ABDOMEN PELVIS FINDINGS  Hepatobiliary: 5 mm hypodensity in  the dome of the liver is stable since 04/07/2015. Geographic low attenuation of the liver parenchyma is compatible with fatty deposition. There is no evidence for gallstones, gallbladder wall thickening, or pericholecystic fluid. No intrahepatic or extrahepatic biliary dilation.  Pancreas: No focal mass lesion. No dilatation of the main duct. No intraparenchymal cyst. No peripancreatic edema.  Spleen: No splenomegaly. No focal mass lesion.  Adrenals/Urinary Tract: No adrenal nodule or mass. 7 mm  low-density lesion extreme upper pole right kidney too small to characterize but likely a tiny cyst. Left kidney unremarkable. No evidence for hydroureter. Bladder is decompressed.  Stomach/Bowel: Small hiatal hernia. Stomach otherwise unremarkable. Duodenum is normally positioned as is the ligament of Treitz. No small bowel wall thickening. No small bowel dilatation. The terminal ileum is normal. The appendix is not visualized, but there is no edema or inflammation in the region of the cecum. Diverticuli are seen scattered along the entire length of the colon without CT findings of diverticulitis.  Vascular/Lymphatic: No abdominal aortic aneurysm. No abdominal atherosclerotic calcification. There is no gastrohepatic or hepatoduodenal ligament lymphadenopathy. No intraperitoneal or retroperitoneal lymphadenopy. No pelvic sidewall lymphadenopathy.  Reproductive: Uterus is unremarkable aside from displacement by the large pelvic mass. 23.4 x 11.6 x 25.8 cm multiloculated complex cystic lesion is identified in the anterior aspect of the central pelvis. This mass appears to be distinct from both the uterus and left ovary in the right gonadal vasculature tracks into the region of this mass. Imaging features are highly suspicious for cystic ovarian neoplasm.  Other: Trace intraperitoneal free fluid is seen in the cul-de-sac and adjacent to the liver tip.  Musculoskeletal: Bone  windows reveal no worrisome lytic or sclerotic osseous lesions.  IMPRESSION: 1. Complex cystic central pelvic mass as seen previously. 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 2. Prominent right gonadal vasculature. 3. Small hiatal hernia. 4. Tiny hypodensity in the dome of the liver is too small to characterize, but is unchanged since 04/07/2015. 5. Trace intraperitoneal free fluid including adjacent to the right liver and in the central pelvis.   Electronically Signed  By: Misty Stanley M.D.  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 Persistent post operative elevation in CA-125 at 304.9 U/ml, normal post-op CEA Oxaliplatin reaction Thrombocytopenia, chemotherapy induced Persistent nausea, chemotherapy induced Thrombocytopenia secondary to chemotherapy  Overall she is doing well with therapy. We discussed her nausea and other options for therapy, she notes that she feels she is managing it well. We will continue to monitor this moving forward.  CA-125 will continue to be monitored. Colonoscopy is planned for after completion of her chemotherapy.   The patient is scheduled for Cycle #9 FOLFOX. She has obvious bruising. Platelets today are 67K. She will be held this week. Return next week for ongoing therapy counts permitting. Will dose reduce 5-FU another 10%. Will also consider additional dose reduction of oxaliplatin.   The patient had one episode of shortness of breath. We reviewed the severity of this situation and I have encouraged her to call 911 if this happens again.   She does not need any refills at this time.   She will return in 1 week for labs and treatment, 3 weeks for follow up PE, labs and cycle #10.  All questions were answered. The patient knows to call the clinic with any  problems, questions or concerns. We can certainly see the patient much sooner if necessary. . This document serves as a record of services personally performed by Ancil Linsey, MD. It was created on her behalf by Arlyce Harman, a trained medical scribe. The creation of this record is based on the scribe's personal observations and the provider's statements to them. This document has been checked and approved by the attending provider.  I have reviewed the above documentation  for accuracy and completeness, and I agree with the above.  This note was electronically signed. Molli Hazard, MD  11/11/2015

## 2015-11-11 NOTE — Progress Notes (Signed)
Platelets low, defer chemotherapy x1 week per dr Whitney Muse

## 2015-11-11 NOTE — Patient Instructions (Signed)
[facility] Discharge Instructions for Patients Receiving Chemotherapy   Beginning January 23rd 2017 lab work for the [facility] will be done in the  Main lab at [facility] on 1st floor. If you have a lab appointment with the [facility] please come in thru the  Main Entrance and check in at the main information desk   Today you received the following chemotherapy agents we are going to hold chemotherapy today because your platelets are low.  Return next week for treatment.   Return to see the doctor in 3 weeks.   To help prevent nausea and vomiting after your treatment, we encourage you to take your nausea medication    If you develop nausea and vomiting, or diarrhea that is not controlled by your medication, call the clinic.  The clinic phone number is [phone]. Office hours are Monday-Friday 8:30am-5:00pm.  BELOW ARE SYMPTOMS THAT SHOULD BE REPORTED IMMEDIATELY:  *FEVER GREATER THAN 101.0 F  *CHILLS WITH OR WITHOUT FEVER  NAUSEA AND VOMITING THAT IS NOT CONTROLLED WITH YOUR NAUSEA MEDICATION  *UNUSUAL SHORTNESS OF BREATH  *UNUSUAL BRUISING OR BLEEDING  TENDERNESS IN MOUTH AND THROAT WITH OR WITHOUT PRESENCE OF ULCERS  *URINARY PROBLEMS  *BOWEL PROBLEMS  UNUSUAL RASH Items with * indicate a potential emergency and should be followed up as soon as possible. If you have an emergency after office hours please contact your primary care physician or go to the nearest emergency department.  Please call the clinic during office hours if you have any questions or concerns.   You may also contact the Patient Navigator at [phone] should you have any questions or need assistance in obtaining follow up care.      Resources For Cancer Patients and their Caregivers ? American Cancer Society: Can assist with transportation, wigs, general needs, runs Look Good Feel Better.        ([phone] ? Cancer Care: Provides financial assistance, online  support groups, medication/co-pay assistance.  1-800-813-HOPE (407) 157-7982) ? [place] [facility] Assists [place] cancer patients and their families through emotional , educational and financial support.  (313)040-9067 ? [place] DSS Where to apply for food stamps, Medicaid and utility assistance. (606) 370-7304 ? [facility]: Transportation to medical appointments. (513) 191-9527 ? Social Security Administration: May apply for disability if have a Stage IV cancer. 2136241630 336-483-3628 ? [employer], Disability and Transit Services: Assists with nutrition, care and transit needs. 940-220-4062

## 2015-11-12 ENCOUNTER — Telehealth (HOSPITAL_COMMUNITY): Payer: Self-pay | Admitting: *Deleted

## 2015-11-13 ENCOUNTER — Encounter (HOSPITAL_COMMUNITY): Payer: BLUE CROSS/BLUE SHIELD

## 2015-11-18 ENCOUNTER — Encounter (HOSPITAL_COMMUNITY): Payer: BLUE CROSS/BLUE SHIELD | Attending: Hematology & Oncology

## 2015-11-18 ENCOUNTER — Encounter (HOSPITAL_COMMUNITY): Payer: Self-pay

## 2015-11-18 ENCOUNTER — Encounter: Payer: Self-pay | Admitting: *Deleted

## 2015-11-18 VITALS — BP 123/68 | HR 64 | Temp 98.0°F | Resp 18 | Wt 127.2 lb

## 2015-11-18 DIAGNOSIS — Z5111 Encounter for antineoplastic chemotherapy: Secondary | ICD-10-CM | POA: Diagnosis not present

## 2015-11-18 DIAGNOSIS — C801 Malignant (primary) neoplasm, unspecified: Secondary | ICD-10-CM | POA: Diagnosis not present

## 2015-11-18 LAB — COMPREHENSIVE METABOLIC PANEL
ALK PHOS: 180 U/L — AB (ref 38–126)
ALT: 57 U/L — ABNORMAL HIGH (ref 14–54)
ANION GAP: 6 (ref 5–15)
AST: 51 U/L — ABNORMAL HIGH (ref 15–41)
Albumin: 3.9 g/dL (ref 3.5–5.0)
BUN: 13 mg/dL (ref 6–20)
CALCIUM: 9.2 mg/dL (ref 8.9–10.3)
CO2: 25 mmol/L (ref 22–32)
Chloride: 107 mmol/L (ref 101–111)
Creatinine, Ser: 0.61 mg/dL (ref 0.44–1.00)
GFR calc non Af Amer: >60 mL/min (ref >60)
Glucose, Bld: 156 mg/dL — ABNORMAL HIGH (ref 65–99)
Potassium: 3.4 mmol/L — ABNORMAL LOW (ref 3.5–5.1)
SODIUM: 138 mmol/L (ref 135–145)
TOTAL PROTEIN: 6.8 g/dL (ref 6.5–8.1)
Total Bilirubin: 0.8 mg/dL (ref 0.3–1.2)

## 2015-11-18 LAB — CBC WITH DIFFERENTIAL/PLATELET
Basophils Absolute: 0 10*3/uL (ref 0.0–0.1)
Basophils Relative: 1 %
EOS ABS: 0 10*3/uL (ref 0.0–0.7)
EOS PCT: 1 %
HCT: 31.9 % — ABNORMAL LOW (ref 36.0–46.0)
HEMOGLOBIN: 10.7 g/dL — AB (ref 12.0–15.0)
LYMPHS ABS: 0.8 10*3/uL (ref 0.7–4.0)
Lymphocytes Relative: 27 %
MCH: 34 pg (ref 26.0–34.0)
MCHC: 33.5 g/dL (ref 30.0–36.0)
MCV: 101.3 fL — ABNORMAL HIGH (ref 78.0–100.0)
MONO ABS: 0.4 10*3/uL (ref 0.1–1.0)
MONOS PCT: 13 %
NEUTROS PCT: 58 %
Neutro Abs: 1.7 10*3/uL (ref 1.7–7.7)
Platelets: 192 10*3/uL (ref 150–400)
RBC: 3.15 MIL/uL — ABNORMAL LOW (ref 3.87–5.11)
RDW: 15.9 % — ABNORMAL HIGH (ref 11.5–15.5)
WBC: 2.9 10*3/uL — ABNORMAL LOW (ref 4.0–10.5)

## 2015-11-18 MED ORDER — FOSAPREPITANT DIMEGLUMINE INJECTION 150 MG
Freq: Once | INTRAVENOUS | Status: AC
  Administered 2015-11-18: 10:00:00 via INTRAVENOUS
  Filled 2015-11-18: qty 5

## 2015-11-18 MED ORDER — SODIUM CHLORIDE 0.9 % IV SOLN
2160.0000 mg/m2 | INTRAVENOUS | Status: DC
  Administered 2015-11-18: 3500 mg via INTRAVENOUS
  Filled 2015-11-18: qty 20

## 2015-11-18 MED ORDER — LORAZEPAM 2 MG/ML IJ SOLN
0.5000 mg | Freq: Once | INTRAMUSCULAR | Status: AC
  Administered 2015-11-18: 0.5 mg via INTRAVENOUS

## 2015-11-18 MED ORDER — PALONOSETRON HCL INJECTION 0.25 MG/5ML
INTRAVENOUS | Status: AC
  Filled 2015-11-18: qty 5

## 2015-11-18 MED ORDER — DEXTROSE 5 % IV SOLN
Freq: Once | INTRAVENOUS | Status: AC
  Administered 2015-11-18: 10:00:00 via INTRAVENOUS

## 2015-11-18 MED ORDER — DIPHENHYDRAMINE HCL 50 MG/ML IJ SOLN
25.0000 mg | Freq: Once | INTRAMUSCULAR | Status: AC
  Administered 2015-11-18: 25 mg via INTRAVENOUS

## 2015-11-18 MED ORDER — SODIUM CHLORIDE 0.9% FLUSH: 10.0000 mL | INTRAVENOUS | Status: DC | PRN

## 2015-11-18 MED ORDER — LORAZEPAM 2 MG/ML IJ SOLN
INTRAMUSCULAR | Status: AC
  Filled 2015-11-18: qty 1

## 2015-11-18 MED ORDER — HEPARIN SOD (PORK) LOCK FLUSH 100 UNIT/ML IV SOLN
500.0000 [IU] | Freq: Once | INTRAVENOUS | Status: DC | PRN
  Filled 2015-11-18: qty 5

## 2015-11-18 MED ORDER — LEUCOVORIN CALCIUM INJECTION 350 MG
400.0000 mg/m2 | Freq: Once | INTRAVENOUS | Status: AC
  Administered 2015-11-18: 652 mg via INTRAVENOUS
  Filled 2015-11-18: qty 32.6

## 2015-11-18 MED ORDER — METHYLPREDNISOLONE SODIUM SUCC 40 MG IJ SOLR
INTRAMUSCULAR | Status: AC
  Filled 2015-11-18: qty 1

## 2015-11-18 MED ORDER — OXALIPLATIN CHEMO INJECTION 100 MG/20ML
75.0000 mg/m2 | Freq: Once | INTRAVENOUS | Status: AC
  Administered 2015-11-18: 120 mg via INTRAVENOUS
  Filled 2015-11-18: qty 24

## 2015-11-18 MED ORDER — PALONOSETRON HCL INJECTION 0.25 MG/5ML
0.2500 mg | Freq: Once | INTRAVENOUS | Status: AC
  Administered 2015-11-18: 0.25 mg via INTRAVENOUS

## 2015-11-18 MED ORDER — POTASSIUM CHLORIDE ER 10 MEQ PO TBCR: 10.0000 meq | EXTENDED_RELEASE_TABLET | Freq: Two times a day (BID) | ORAL | Status: DC

## 2015-11-18 MED ORDER — DIPHENHYDRAMINE HCL 50 MG/ML IJ SOLN
INTRAMUSCULAR | Status: AC
  Filled 2015-11-18: qty 1

## 2015-11-18 MED ORDER — METHYLPREDNISOLONE SODIUM SUCC 40 MG IJ SOLR
40.0000 mg | Freq: Once | INTRAMUSCULAR | Status: AC
  Administered 2015-11-18: 40 mg via INTRAVENOUS

## 2015-11-18 NOTE — Patient Instructions (Signed)
[facility] Discharge Instructions for Patients Receiving Chemotherapy   Beginning January 23rd 2017 lab work for the [facility] will be done in the  Main lab at [facility] on 1st floor. If you have a lab appointment with the [facility] please come in thru the  Main Entrance and check in at the main information desk   Today you received the following chemotherapy agents folfox Follow up as scheduled Wait until you have finished treatment to have your teeth cleaned    To help prevent nausea and vomiting after your treatment, we encourage you to take your nausea medication  If you develop nausea and vomiting, or diarrhea that is not controlled by your medication, call the clinic.    The clinic phone number is [phone]. Office hours are Monday-Friday 8:30am-5:00pm.  BELOW ARE SYMPTOMS THAT SHOULD BE REPORTED IMMEDIATELY:  *FEVER GREATER THAN 101.0 F  *CHILLS WITH OR WITHOUT FEVER  NAUSEA AND VOMITING THAT IS NOT CONTROLLED WITH YOUR NAUSEA MEDICATION  *UNUSUAL SHORTNESS OF BREATH  *UNUSUAL BRUISING OR BLEEDING  TENDERNESS IN MOUTH AND THROAT WITH OR WITHOUT PRESENCE OF ULCERS  *URINARY PROBLEMS  *BOWEL PROBLEMS  UNUSUAL RASH Items with * indicate a potential emergency and should be followed up as soon as possible. If you have an emergency after office hours please contact your primary care physician or go to the nearest emergency department.  Please call the clinic during office hours if you have any questions or concerns.   You may also contact the Patient Navigator at [phone] should you have any questions or need assistance in obtaining follow up care.      Resources For Cancer Patients and their Caregivers ? American Cancer Society: Can assist with transportation, wigs, general needs, runs Look Good Feel Better.        (215)389-8166 ? Cancer Care: Provides financial assistance, online support groups, medication/co-pay  assistance.  1-800-813-HOPE 209-098-3877) ? [facility] Assists [place] cancer patients and their families through emotional , educational and financial support.  240-350-7618 ? [place] DSS Where to apply for food stamps, Medicaid and utility assistance. 503-707-4015 ? [facility]: Transportation to medical appointments. 410-042-8140 ? Social Security Administration: May apply for disability if have a Stage IV cancer. ([phone] [phone] ? [employer], Disability and Transit Services: Assists with nutrition, care and transit needs. [phone]

## 2015-11-18 NOTE — Progress Notes (Signed)
Pt asked about going to the dentist bc of her low platelet count we recommend that she wait until she is finished with her chemotherapy to have her teeth cleaned   1100 pts hands started itching, Dr Whitney Muse notified, bendaryl given, solu-medrol given.  1140  Chemotherapy resumed, pt feeling better    Horton Marshall Tolerated chemotherapy well today Discharged ambulatory with pump

## 2015-11-18 NOTE — Progress Notes (Signed)
[place] Clinical Social Work  Clinical Social Work was referred by [place] rounding in the infusion room for assessment of psychosocial needs due to financial concerns. Clinical Social Worker met with patient at the bedside in infusion room. Pt reports she is able to work limited hours due to her cancer treatment. She has considered applying for assistance, but has not wanted to disclose personal information.  CSW explained the process for most cancer assistance organizations. CSW also provided pt with Cancer Care financial assistance application. CSW made referral to financial counselor, who states she has already met with pt. CSW encouraged pt to reach out to [place] and Duanne Limerick for additional assistance.     Clinical Social Work interventions: Resource assistance Loren Racer, [place] Tuesdays   [phone]

## 2015-11-19 LAB — CEA: CEA: 2.5 ng/mL (ref 0.0–4.7)

## 2015-11-19 LAB — CA 125: CA 125: 15.8 U/mL (ref 0.0–38.1)

## 2015-11-20 ENCOUNTER — Encounter (HOSPITAL_BASED_OUTPATIENT_CLINIC_OR_DEPARTMENT_OTHER): Payer: BLUE CROSS/BLUE SHIELD

## 2015-11-20 VITALS — BP 117/61 | HR 62 | Temp 98.4°F | Resp 18

## 2015-11-20 DIAGNOSIS — Z5189 Encounter for other specified aftercare: Secondary | ICD-10-CM | POA: Diagnosis not present

## 2015-11-20 DIAGNOSIS — C801 Malignant (primary) neoplasm, unspecified: Secondary | ICD-10-CM

## 2015-11-20 MED ORDER — HEPARIN SOD (PORK) LOCK FLUSH 100 UNIT/ML IV SOLN
500.0000 [IU] | Freq: Once | INTRAVENOUS | Status: AC | PRN
  Administered 2015-11-20: 500 [IU]

## 2015-11-20 MED ORDER — PEGFILGRASTIM 6 MG/0.6ML ~~LOC~~ PSKT
6.0000 mg | PREFILLED_SYRINGE | Freq: Once | SUBCUTANEOUS | Status: AC
  Administered 2015-11-20: 6 mg via SUBCUTANEOUS

## 2015-11-20 MED ORDER — SODIUM CHLORIDE 0.9% FLUSH
10.0000 mL | INTRAVENOUS | Status: DC | PRN
  Administered 2015-11-20: 10 mL
  Filled 2015-11-20: qty 10

## 2015-11-20 MED ORDER — HEPARIN SOD (PORK) LOCK FLUSH 100 UNIT/ML IV SOLN
INTRAVENOUS | Status: AC
  Filled 2015-11-20: qty 5

## 2015-11-20 MED ORDER — PEGFILGRASTIM 6 MG/0.6ML ~~LOC~~ PSKT
PREFILLED_SYRINGE | SUBCUTANEOUS | Status: AC
  Filled 2015-11-20: qty 0.6

## 2015-11-20 NOTE — Patient Instructions (Signed)
[place] at [facility] Discharge Instructions  RECOMMENDATIONS MADE BY THE CONSULTANT AND ANY TEST RESULTS WILL BE SENT TO YOUR REFERRING PHYSICIAN.  Pump removed neulasta on-pro placed Follow up as scheduled Please call the clinic if you have any questions or concerns    Thank you for choosing [place] at [facility] to provide your oncology and hematology care.  To afford each patient quality time with our provider, please arrive at least 15 minutes before your scheduled appointment time.   Beginning January 23rd 2017 lab work for the [facility] will be done in the  Main lab at [facility] on 1st floor. If you have a lab appointment with the [place] please come in thru the  Main Entrance and check in at the main information desk  You need to re-schedule your appointment should you arrive 10 or more minutes late.  We strive to give you quality time with our providers, and arriving late affects you and other patients whose appointments are after yours.  Also, if you no show three or more times for appointments you may be dismissed from the clinic at the providers discretion.     Again, thank you for choosing [facility].  Our hope is that these requests will decrease the amount of time that you wait before being seen by our physicians.       _____________________________________________________________  Should you have questions after your visit to [facility], please contact our office at [phone] between the hours of 8:30 a.m. and 4:30 p.m.  Voicemails left after 4:30 p.m. will not be returned until the following business day.  For prescription refill requests, have your pharmacy contact our office.         Resources For Cancer Patients and their Caregivers ? American Cancer Society: Can assist with transportation, wigs, general needs, runs Look Good Feel Better.         ([phone] ? Cancer Care: Provides financial assistance, online support groups, medication/co-pay assistance.  1-800-813-HOPE 669-202-4137) ? [place] Assists [place] cancer patients and their families through emotional , educational and financial support.  781-381-2738 ? [place] DSS Where to apply for food stamps, Medicaid and utility assistance. 214-125-1643 ? [employer]ATS: Transportation to medical appointments. 240-314-5534 ? Social Security Administration: May apply for disability if have a Stage IV cancer. (819)698-9676 (559)610-9965 ? [employer], Disability and Transit Services: Assists with nutrition, care and transit needs. [place] Support Programs: @10RELATIVEDAYS @ > Cancer Support Group  2nd Tuesday of the month 1pm-2pm, Journey Room  > Creative Journey  3rd Tuesday of the month 1130am-1pm, Journey Room  > Look Good Feel Better  1st Wednesday of the month 10am-12 noon, Journey Room (Call [place] to register [phone])

## 2015-11-20 NOTE — Progress Notes (Signed)
Natalie Ball presented for Portacath de-access and flush.  Proper placement of portacath confirmed by CXR.  Portacath located right chest wall accessed with  H 20 needle.  Good blood return present. Portacath flushed with 32ml NS and 500U/71ml Heparin and needle removed intact.  Procedure tolerated well and without incident. Pump removed     .Marland KitchenHulan Amato Manleypresents today for neulasta OBI placement per MD orders. OBI device filled per protocol and placed on right Upper Arm. Needle/catheter placement noted prior to patient leaving. Tolerated without incident and aware of injection to be delivered in  27 hours.

## 2015-11-25 ENCOUNTER — Ambulatory Visit (HOSPITAL_COMMUNITY): Payer: BLUE CROSS/BLUE SHIELD | Admitting: Hematology & Oncology

## 2015-11-25 ENCOUNTER — Inpatient Hospital Stay (HOSPITAL_COMMUNITY): Payer: BLUE CROSS/BLUE SHIELD

## 2015-11-27 ENCOUNTER — Encounter (HOSPITAL_COMMUNITY): Payer: BLUE CROSS/BLUE SHIELD

## 2015-12-02 ENCOUNTER — Encounter (HOSPITAL_BASED_OUTPATIENT_CLINIC_OR_DEPARTMENT_OTHER): Payer: BLUE CROSS/BLUE SHIELD

## 2015-12-02 ENCOUNTER — Encounter (HOSPITAL_BASED_OUTPATIENT_CLINIC_OR_DEPARTMENT_OTHER): Payer: BLUE CROSS/BLUE SHIELD | Admitting: Oncology

## 2015-12-02 ENCOUNTER — Encounter (HOSPITAL_COMMUNITY): Payer: Self-pay | Admitting: Oncology

## 2015-12-02 VITALS — BP 135/50 | HR 74 | Temp 97.4°F | Resp 18 | Wt 126.6 lb

## 2015-12-02 DIAGNOSIS — C801 Malignant (primary) neoplasm, unspecified: Secondary | ICD-10-CM | POA: Diagnosis not present

## 2015-12-02 LAB — CBC WITH DIFFERENTIAL/PLATELET
Basophils Absolute: 0 10*3/uL (ref 0.0–0.1)
Basophils Relative: 0 %
EOS ABS: 0 10*3/uL (ref 0.0–0.7)
Eosinophils Relative: 0 %
HEMATOCRIT: 30.3 % — AB (ref 36.0–46.0)
HEMOGLOBIN: 10.4 g/dL — AB (ref 12.0–15.0)
LYMPHS ABS: 1.4 10*3/uL (ref 0.7–4.0)
Lymphocytes Relative: 12 %
MCH: 35 pg — AB (ref 26.0–34.0)
MCHC: 34.3 g/dL (ref 30.0–36.0)
MCV: 102 fL — ABNORMAL HIGH (ref 78.0–100.0)
Monocytes Absolute: 0.6 10*3/uL (ref 0.1–1.0)
Monocytes Relative: 5 %
NEUTROS ABS: 9.4 10*3/uL — AB (ref 1.7–7.7)
NEUTROS PCT: 83 %
Platelets: 85 10*3/uL — ABNORMAL LOW (ref 150–400)
RBC: 2.97 MIL/uL — AB (ref 3.87–5.11)
RDW: 15.4 % (ref 11.5–15.5)
WBC: 11.4 10*3/uL — AB (ref 4.0–10.5)

## 2015-12-02 LAB — COMPREHENSIVE METABOLIC PANEL
ALBUMIN: 3.8 g/dL (ref 3.5–5.0)
ALK PHOS: 239 U/L — AB (ref 38–126)
ALT: 51 U/L (ref 14–54)
AST: 43 U/L — ABNORMAL HIGH (ref 15–41)
Anion gap: 6 (ref 5–15)
BUN: 13 mg/dL (ref 6–20)
CALCIUM: 8.8 mg/dL — AB (ref 8.9–10.3)
CO2: 25 mmol/L (ref 22–32)
CREATININE: 0.63 mg/dL (ref 0.44–1.00)
Chloride: 108 mmol/L (ref 101–111)
GFR calc non Af Amer: >60 mL/min (ref >60)
GLUCOSE: 97 mg/dL (ref 65–99)
Potassium: 3.3 mmol/L — ABNORMAL LOW (ref 3.5–5.1)
SODIUM: 139 mmol/L (ref 135–145)
Total Bilirubin: 0.8 mg/dL (ref 0.3–1.2)
Total Protein: 6.6 g/dL (ref 6.5–8.1)

## 2015-12-02 MED ORDER — HEPARIN SOD (PORK) LOCK FLUSH 100 UNIT/ML IV SOLN
500.0000 [IU] | Freq: Once | INTRAVENOUS | Status: AC
  Administered 2015-12-02: 500 [IU] via INTRAVENOUS
  Filled 2015-12-02: qty 5

## 2015-12-02 MED ORDER — HEPARIN SOD (PORK) LOCK FLUSH 100 UNIT/ML IV SOLN
INTRAVENOUS | Status: AC
  Filled 2015-12-02: qty 5

## 2015-12-02 NOTE — Patient Instructions (Signed)
[place] at [facility] Discharge Instructions  RECOMMENDATIONS MADE BY THE CONSULTANT AND ANY TEST RESULTS WILL BE SENT TO YOUR REFERRING PHYSICIAN.  Exam done and seen today by Kirby Crigler Will hold off on treatment today, hold for 7 days Return for chemo in 1 week Return to see the Doctor in 3 weeks for chemo and follow up Call the clinic for any concerns or questions.  Thank you for choosing [place] at [facility] to provide your oncology and hematology care.  To afford each patient quality time with our provider, please arrive at least 15 minutes before your scheduled appointment time.   Beginning January 23rd 2017 lab work for the [facility] will be done in the  Main lab at [facility] on 1st floor. If you have a lab appointment with the [place] please come in thru the  Main Entrance and check in at the main information desk  You need to re-schedule your appointment should you arrive 10 or more minutes late.  We strive to give you quality time with our providers, and arriving late affects you and other patients whose appointments are after yours.  Also, if you no show three or more times for appointments you may be dismissed from the clinic at the providers discretion.     Again, thank you for choosing [facility].  Our hope is that these requests will decrease the amount of time that you wait before being seen by our physicians.       _____________________________________________________________  Should you have questions after your visit to [facility], please contact our office at [phone] between the hours of 8:30 a.m. and 4:30 p.m.  Voicemails left after 4:30 p.m. will not be returned until the following business day.  For prescription refill requests, have your pharmacy contact our office.         Resources For Cancer Patients and their Caregivers ? American Cancer Society: Can assist  with transportation, wigs, general needs, runs Look Good Feel Better.        [phone] ? Cancer Care: Provides financial assistance, online support groups, medication/co-pay assistance.  1-800-813-HOPE 205-661-2539) ? [place] Assists [place] cancer patients and their families through emotional , educational and financial support.  8724198869 ? [place] DSS Where to apply for food stamps, Medicaid and utility assistance. 825-743-3569 ? RCATS: Transportation to medical appointments. (406) 217-8510 ? Social Security Administration: May apply for disability if have a Stage IV cancer. 575-564-7318 640-092-2100 ? [employer], Disability and Transit Services: Assists with nutrition, care and transit needs. [place] Support Programs: @10RELATIVEDAYS @ > Cancer Support Group  2nd Tuesday of the month 1pm-2pm, Journey Room  > Creative Journey  3rd Tuesday of the month 1130am-1pm, Journey Room  > Look Good Feel Better  1st Wednesday of the month 10am-12 noon, Journey Room (Call [place] to register [phone])

## 2015-12-02 NOTE — Assessment & Plan Note (Addendum)
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 [name] on 06/26/2015 after findings a large abdominopelvic mass by Gynecologist, Dr. Evie Lacks. It is dictated that the patient had a pre-operative elevation of CA 125 and CEA.  Began systemic chemotherapy in the adjuvant setting on 07/22/2015 consisting of FOLFOX, complicated by allergic reaction to Oxaliplatin on cycle #2, resulting in the need for oxaliplatin desensitization.  Persistent post operative elevation in CA-125 at 304.9 U/ml, normal post-op CEA.  Oncology history is updated.  Labs today: CBC diff, CMET.  Last CEA and CA-125 performed on 11/18/2015.  I personally reviewed and went over laboratory results with the patient.  The results are noted within this dictation.  The count is 85,000 today.  We will defer treatment x 1 week as a result of thrombocytopenia.  Return in 1 week for treatment and 3 weeks for treatment #11 and follow-up appointment.

## 2015-12-02 NOTE — Progress Notes (Signed)
Tx deferred d/t platelet count 85,000.

## 2015-12-02 NOTE — Progress Notes (Signed)
[facility], [place] [address]  Mucinous adenocarcinoma [facility])  CURRENT THERAPY: FOLFOX beginning on 07/22/2015  INTERVAL HISTORY: Natalie Ball 65 y.o. 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 [name] on 06/26/2015 after findings a large abdominopelvic mass by Gynecologist, Dr. Evie Lacks. It is dictated that the patient had a pre-operative elevation of CA 125 and CEA. Began systemic chemotherapy in the adjuvant setting on 07/22/2015.  Persistent post operative elevation in CA-125 at 304.9 U/ml, normal post-op CEA.    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 Prominent right gonadal vasculature. Tiny hypodensity in the dome of the liver is too small to characterize, but stable.   06/26/2015 Pathology Results Adnexa - ovary +/- tube, neoplastic, right - MUCINOUS ADENOCARCINOMA, 13 CM. - OVARIAN SURFACE NOT INVOLVED BY TUMOR. - UNREMARKABLE FALLOPIAN TUBE WITH NO ENDOMETRIOSIS OR MALIGNANCY.   06/26/2015 Procedure Exploratory laparotomy with total hysterectomy bilateral salpingo-oophorectomy, pelvic and para-aortic lymph node dissection, omentectomy washings and biopsies for ovarian cancer by Dr. [name]   06/27/2015 Pathology Results PERITONEAL WASHING(SPECIMEN 1 OF 1 COLLECTED 06/27/15): ATYPICAL CELLS. Immunohistochemistry calretinin, cytokeratin 5/6, estrogen receptor, progesterone receptor and WT-1 is performed. The morphology and immunophenotype favor reactive mesothelial cells   07/10/2015 Initial Diagnosis Mucinous adenocarcinoma ([place])   07/22/2015 -  Chemotherapy FOLFOX   07/24/2015 Survivorship Genetic Counseling consultation with [name].   07/26/2015 Adverse Reaction Loose stools, 2-3 per  24 hours   07/29/2015 Treatment Plan Change 5FU bolus discontinued due to loose stools.   08/05/2015 Adverse Reaction Reaction to Oxaliplatin   09/01/2015 Treatment Plan Change Decrease 5FU CI by 10%   09/30/2015 Treatment Plan Change Oxaliplatin reduced by 10%   12/02/2015 Treatment Plan Change Treatement deferred x 1 week, thrombocytopenia at 85,000   She notes that she feels great but does mention a number of complaints.  She has intermittent epistaxis. She notes that they occur with blowing of her nose. She reports that it is more than usual. She is able to stop the bleeding conservatively with pressure.  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 She notes that it has improved since her last treatment.  She notes that her nausea is well controlled at home. She notes that this too has improved with dose reductions. She denies any emesis. Her weight is relatively stable. She is down approximately 1 pound compared to 10/14/2015.  Review of Systems  Constitutional: Negative for fever and chills.  HENT: Positive for nosebleeds (Intermittent epistaxis).   Eyes: Negative.   Respiratory: Negative.  Negative for hemoptysis.   Cardiovascular: Negative.   Gastrointestinal: Positive for nausea (improved, [email or web address]). Negative for vomiting, abdominal pain, diarrhea, constipation, blood in stool and melena.  Genitourinary: Negative for dysuria, urgency, frequency and hematuria.  Musculoskeletal: Negative.   Skin: Negative.  Negative for rash.  Neurological: Positive for tingling (Fingertips only.). Negative for weakness.  Endo/Heme/Allergies: Bruises/bleeds easily (epistaxis and easy bruising).    Past Medical History  Diagnosis Date  . Allergy   . GERD (gastroesophageal reflux disease)   . Complication of anesthesia   . PONV (postoperative nausea and vomiting)   . Wears glasses   . Tinnitus   . Vertigo   . Pneumonia   . History of bronchitis   .  Urinary urgency   .  Headache   . Mucinous adenocarcinoma ([place]) 07/10/2015  . Family history of ovarian cancer   . Family history of pancreatic cancer   . Family history of colon cancer     Past Surgical History  Procedure Laterality Date  . Tubal ligation  1974  . Diagnostic laparoscopy  1977  . Rectal fusula  1980  . Appendectomy    . Laparotomy N/A 06/26/2015    Procedure: EXPLORATORY LAPAROTOMY;  Surgeon: [name], MD;  Location: [facility];  Service: Gynecology;  Laterality: N/A;  . Abdominal hysterectomy N/A 06/26/2015    Procedure: TOTAL HYSTERECTOMY ABDOMINAL;  Surgeon: [name], MD;  Location: [facility];  Service: Gynecology;  Laterality: N/A;  . Salpingoophorectomy Bilateral 06/26/2015    Procedure: SALPINGO OOPHORECTOMY;  Surgeon: [name], MD;  Location: [facility];  Service: Gynecology;  Laterality: Bilateral;  . Omentectomy N/A 06/26/2015    Procedure:  OMENTECTOMY;  Surgeon: [name], MD;  Location: [facility];  Service: Gynecology;  Laterality: N/A;  . Debulking N/A 06/26/2015    Procedure: bilateral periaortic lymph node dissection biopsies and peritoneal washings;  Surgeon: [name], MD;  Location: [facility];  Service: Gynecology;  Laterality: N/A;  . Portacath placement Left 07/18/2015    Procedure: INSERTION PORT-A-CATH;  Surgeon: Aviva Signs, MD;  Location: [facility];  Service: General;  Laterality: Left;  pt knows to arrive at 6:15    Family History  Problem Relation Age of Onset  . Cancer Father   . Colon polyps Father   . Ovarian cancer Maternal Aunt   . Colon cancer Maternal Aunt   . Pancreatic cancer Maternal Uncle   . Colon polyps Mother   . Colon polyps Sister   . Cancer Paternal Aunt     NOS  . Heart defect Maternal Grandmother     hole in the heart  . COPD Maternal Grandfather   . Colon cancer Paternal Grandmother     dx possibly under 22  . Mental illness Sister   . Uterine cancer Maternal Aunt     dx possibly in her 73s  . Pancreatic cancer Maternal Aunt     . Cancer Paternal Aunt     NOS  . Cancer Cousin     adrenal cancer dx in his 73s; maternal cousin  . Ovarian cancer Cousin     dx in her 60s; maternal cousin  . Diabetes Daughter     Social History   Social History  . Marital Status: Widowed    Spouse Name: N/A  . Number of Children: 1  . Years of Education: N/A   Social History Main Topics  . Smoking status: Former Smoker -- 1.00 packs/day for 19 years    Types: Cigarettes    Quit date: 06/14/1988  . Smokeless tobacco: Never Used  . Alcohol Use: Yes     Comment: occasionally   . Drug Use: No  . Sexual Activity: Not Asked   Other Topics Concern  . None   Social History Narrative     PHYSICAL EXAMINATION  ECOG PERFORMANCE STATUS: 1 - Symptomatic but completely ambulatory  Filed Vitals:   12/02/15 0842  BP: 135/50  Pulse: 74  Temp: 97.4 F (36.3 C)  Resp: 18    GENERAL:alert, no distress, well nourished, well developed, comfortable, cooperative, smiling and in chemo-recliner, unaccompanied. SKIN: skin color, texture, turgor are normal, no rashes or significant lesions HEAD: Normocephalic, No masses, lesions, tenderness or abnormalities EYES: normal, EOMI,  Conjunctiva are pink and non-injected EARS: External ears normal OROPHARYNX:lips, buccal mucosa, and tongue normal and mucous membranes are moist  NECK: supple, trachea midline LYMPH:  not examined BREAST:not examined LUNGS: clear to auscultation and percussion HEART: regular rate & rhythm, no murmurs, no gallops, S1 normal and S2 normal ABDOMEN:abdomen soft, non-tender, normal bowel sounds and no masses or organomegaly BACK: Back symmetric, no curvature. EXTREMITIES:less then 2 second capillary refill, no joint deformities, effusion, or inflammation, no skin discoloration, no cyanosis  NEURO: alert & oriented x 3 with fluent speech, no focal motor/sensory deficits, gait normal   LABORATORY DATA: CBC    Component Value Date/Time   WBC 11.4*  12/02/2015 0846   RBC 2.97* 12/02/2015 0846   HGB 10.4* 12/02/2015 0846   HCT 30.3* 12/02/2015 0846   PLT 85* 12/02/2015 0846   MCV 102.0* 12/02/2015 0846   MCH 35.0* 12/02/2015 0846   MCHC 34.3 12/02/2015 0846   RDW 15.4 12/02/2015 0846   LYMPHSABS 1.4 12/02/2015 0846   MONOABS 0.6 12/02/2015 0846   EOSABS 0.0 12/02/2015 0846   BASOSABS 0.0 12/02/2015 0846      Chemistry      Component Value Date/Time   NA 138 11/18/2015 0848   NA 141 06/20/2015 1121   K 3.4* 11/18/2015 0848   K 4.1 06/20/2015 1121   CL 107 11/18/2015 0848   CO2 25 11/18/2015 0848   CO2 24 06/20/2015 1121   BUN 13 11/18/2015 0848   BUN 14.7 06/20/2015 1121   CREATININE 0.61 11/18/2015 0848   CREATININE 0.8 06/20/2015 1121      Component Value Date/Time   CALCIUM 9.2 11/18/2015 0848   CALCIUM 9.2 06/20/2015 1121   ALKPHOS 180* 11/18/2015 0848   AST 51* 11/18/2015 0848   ALT 57* 11/18/2015 0848   BILITOT 0.8 11/18/2015 0848     Lab Results  Component Value Date   CEA 2.5 11/18/2015   Lab Results  Component Value Date   CA125 15.8 11/18/2015      PENDING LABS:   RADIOGRAPHIC STUDIES:  No results found.   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 [name] on 06/26/2015 after findings a large abdominopelvic mass by Gynecologist, Dr. Evie Lacks. It is dictated that the patient had a pre-operative elevation of CA 125 and CEA.  Began systemic chemotherapy in the adjuvant setting on 07/22/2015 consisting of FOLFOX, complicated by allergic reaction to Oxaliplatin on cycle #2, resulting in the need for oxaliplatin desensitization.  Persistent post operative elevation in CA-125 at 304.9 U/ml, normal post-op CEA.  Oncology history is updated.  Labs today: CBC diff, CMET.  Last CEA and CA-125  performed on 11/18/2015.  I personally reviewed and went over laboratory results with the patient.  The results are noted within this dictation.  The count is 85,000 today.  We will defer treatment x 1 week as a result of thrombocytopenia.  Return in 1 week for treatment and 3 weeks for treatment #11 and follow-up appointment.    ORDERS PLACED FOR THIS ENCOUNTER: No orders of the defined types were placed in this encounter.    MEDICATIONS PRESCRIBED THIS ENCOUNTER: No orders of the defined types were placed in this encounter.    THERAPY PLAN:  Complete adjuvant FOLFOX, followed by a colonoscopy by Dr. Britta Mccreedy (GI) in [place].  All questions were answered.  The patient knows to call the clinic with any problems, questions or concerns. We can certainly see the patient much sooner if necessary.  Patient and plan discussed with Dr. Ancil Linsey and she is in agreement with the aforementioned.   This note is electronically signed by: Doy Mince 12/02/2015 9:14 AM

## 2015-12-04 ENCOUNTER — Encounter (HOSPITAL_COMMUNITY): Payer: BLUE CROSS/BLUE SHIELD

## 2015-12-09 ENCOUNTER — Ambulatory Visit (HOSPITAL_COMMUNITY): Payer: BLUE CROSS/BLUE SHIELD

## 2015-12-09 ENCOUNTER — Encounter (HOSPITAL_BASED_OUTPATIENT_CLINIC_OR_DEPARTMENT_OTHER): Payer: BLUE CROSS/BLUE SHIELD

## 2015-12-09 VITALS — BP 119/59 | HR 62 | Temp 98.2°F | Resp 18 | Wt 125.4 lb

## 2015-12-09 DIAGNOSIS — C801 Malignant (primary) neoplasm, unspecified: Secondary | ICD-10-CM | POA: Diagnosis not present

## 2015-12-09 DIAGNOSIS — Z5111 Encounter for antineoplastic chemotherapy: Secondary | ICD-10-CM

## 2015-12-09 LAB — CBC WITH DIFFERENTIAL/PLATELET
BASOS ABS: 0 10*3/uL (ref 0.0–0.1)
Basophils Relative: 1 %
EOS PCT: 1 %
Eosinophils Absolute: 0 10*3/uL (ref 0.0–0.7)
HEMATOCRIT: 32.7 % — AB (ref 36.0–46.0)
Hemoglobin: 11 g/dL — ABNORMAL LOW (ref 12.0–15.0)
LYMPHS ABS: 0.8 10*3/uL (ref 0.7–4.0)
LYMPHS PCT: 23 %
MCH: 34.7 pg — AB (ref 26.0–34.0)
MCHC: 33.6 g/dL (ref 30.0–36.0)
MCV: 103.2 fL — AB (ref 78.0–100.0)
MONO ABS: 0.5 10*3/uL (ref 0.1–1.0)
Monocytes Relative: 16 %
Neutro Abs: 2.1 10*3/uL (ref 1.7–7.7)
Neutrophils Relative %: 59 %
PLATELETS: 183 10*3/uL (ref 150–400)
RBC: 3.17 MIL/uL — AB (ref 3.87–5.11)
RDW: 15.1 % (ref 11.5–15.5)
WBC: 3.4 10*3/uL — ABNORMAL LOW (ref 4.0–10.5)

## 2015-12-09 LAB — COMPREHENSIVE METABOLIC PANEL
ALK PHOS: 187 U/L — AB (ref 38–126)
ALT: 56 U/L — AB (ref 14–54)
AST: 53 U/L — AB (ref 15–41)
Albumin: 4 g/dL (ref 3.5–5.0)
Anion gap: 5 (ref 5–15)
BUN: 19 mg/dL (ref 6–20)
CHLORIDE: 106 mmol/L (ref 101–111)
CO2: 27 mmol/L (ref 22–32)
CREATININE: 0.57 mg/dL (ref 0.44–1.00)
Calcium: 9.2 mg/dL (ref 8.9–10.3)
GFR calc Af Amer: >60 mL/min (ref >60)
GFR calc non Af Amer: >60 mL/min (ref >60)
Glucose, Bld: 95 mg/dL (ref 65–99)
Potassium: 4.2 mmol/L (ref 3.5–5.1)
SODIUM: 138 mmol/L (ref 135–145)
Total Bilirubin: 0.9 mg/dL (ref 0.3–1.2)
Total Protein: 6.8 g/dL (ref 6.5–8.1)

## 2015-12-09 MED ORDER — LEUCOVORIN CALCIUM INJECTION 350 MG
400.0000 mg/m2 | Freq: Once | INTRAVENOUS | Status: AC
  Administered 2015-12-09: 652 mg via INTRAVENOUS
  Filled 2015-12-09: qty 32.6

## 2015-12-09 MED ORDER — SODIUM CHLORIDE 0.9% FLUSH: 10.0000 mL | INTRAVENOUS | Status: DC | PRN

## 2015-12-09 MED ORDER — SODIUM CHLORIDE 0.9 % IV SOLN
2160.0000 mg/m2 | INTRAVENOUS | Status: DC
  Administered 2015-12-09: 3500 mg via INTRAVENOUS
  Filled 2015-12-09: qty 50

## 2015-12-09 MED ORDER — FOSAPREPITANT DIMEGLUMINE INJECTION 150 MG
Freq: Once | INTRAVENOUS | Status: AC
  Administered 2015-12-09: 10:00:00 via INTRAVENOUS
  Filled 2015-12-09: qty 5

## 2015-12-09 MED ORDER — DEXTROSE 5 % IV SOLN
Freq: Once | INTRAVENOUS | Status: AC
  Administered 2015-12-09: 10:00:00 via INTRAVENOUS

## 2015-12-09 MED ORDER — HEPARIN SOD (PORK) LOCK FLUSH 100 UNIT/ML IV SOLN: 500.0000 [IU] | Freq: Once | INTRAVENOUS | Status: DC | PRN

## 2015-12-09 MED ORDER — DIPHENHYDRAMINE HCL 50 MG/ML IJ SOLN
25.0000 mg | Freq: Once | INTRAMUSCULAR | Status: AC
  Administered 2015-12-09: 25 mg via INTRAVENOUS

## 2015-12-09 MED ORDER — LORAZEPAM 2 MG/ML IJ SOLN
INTRAMUSCULAR | Status: AC
  Filled 2015-12-09: qty 1

## 2015-12-09 MED ORDER — DIPHENHYDRAMINE HCL 50 MG/ML IJ SOLN
INTRAMUSCULAR | Status: AC
  Filled 2015-12-09: qty 1

## 2015-12-09 MED ORDER — METHYLPREDNISOLONE SODIUM SUCC 40 MG IJ SOLR
INTRAMUSCULAR | Status: AC
  Filled 2015-12-09: qty 1

## 2015-12-09 MED ORDER — OXALIPLATIN CHEMO INJECTION 100 MG/20ML
75.0000 mg/m2 | Freq: Once | INTRAVENOUS | Status: AC
  Administered 2015-12-09: 120 mg via INTRAVENOUS
  Filled 2015-12-09: qty 24

## 2015-12-09 MED ORDER — PALONOSETRON HCL INJECTION 0.25 MG/5ML
INTRAVENOUS | Status: AC
  Filled 2015-12-09: qty 5

## 2015-12-09 MED ORDER — PALONOSETRON HCL INJECTION 0.25 MG/5ML
0.2500 mg | Freq: Once | INTRAVENOUS | Status: AC
  Administered 2015-12-09: 0.25 mg via INTRAVENOUS

## 2015-12-09 MED ORDER — METHYLPREDNISOLONE SODIUM SUCC 40 MG IJ SOLR
40.0000 mg | Freq: Once | INTRAMUSCULAR | Status: AC
  Administered 2015-12-09: 40 mg via INTRAVENOUS

## 2015-12-09 MED ORDER — LORAZEPAM 2 MG/ML IJ SOLN
0.5000 mg | Freq: Once | INTRAMUSCULAR | Status: AC
  Administered 2015-12-09: 0.5 mg via INTRAVENOUS

## 2015-12-09 NOTE — Patient Instructions (Addendum)
[facility] Discharge Instructions for Patients Receiving Chemotherapy   Beginning January 23rd 2017 lab work for the [facility] will be done in the  Main lab at [facility] on 1st floor. If you have a lab appointment with the [place] please come in thru the  Main Entrance and check in at the main information desk   Today you received the following chemotherapy agents FOLFOX Follow up as scheduled Please call the clinic if you have any questions or concerns   To help prevent nausea and vomiting after your treatment, we encourage you to take your nausea medications   If you develop nausea and vomiting, or diarrhea that is not controlled by your medication, call the clinic.  The clinic phone number is [phone]. Office hours are Monday-Friday 8:30am-5:00pm.  BELOW ARE SYMPTOMS THAT SHOULD BE REPORTED IMMEDIATELY:  *FEVER GREATER THAN 101.0 F  *CHILLS WITH OR WITHOUT FEVER  NAUSEA AND VOMITING THAT IS NOT CONTROLLED WITH YOUR NAUSEA MEDICATION  *UNUSUAL SHORTNESS OF BREATH  *UNUSUAL BRUISING OR BLEEDING  TENDERNESS IN MOUTH AND THROAT WITH OR WITHOUT PRESENCE OF ULCERS  *URINARY PROBLEMS  *BOWEL PROBLEMS  UNUSUAL RASH Items with * indicate a potential emergency and should be followed up as soon as possible. If you have an emergency after office hours please contact your primary care physician or go to the nearest emergency department.  Please call the clinic during office hours if you have any questions or concerns.   You may also contact the Patient Navigator at [phone] should you have any questions or need assistance in obtaining follow up care.      Resources For Cancer Patients and their Caregivers ? American Cancer Society: Can assist with transportation, wigs, general needs, runs Look Good Feel Better.        816-816-1613 ? Cancer Care: Provides financial assistance, online support groups, medication/co-pay assistance.   1-800-813-HOPE 780-444-9749) ? [place] Assists [place] cancer patients and their families through emotional , educational and financial support.  727 625 6168 ? [place] DSS Where to apply for food stamps, Medicaid and utility assistance. 253-236-8370 ? [employer]: Transportation to medical appointments. (628)757-6238 ? Social Security Administration: May apply for disability if have a Stage IV cancer. 907 680 5730 337-536-6163 ? [employer], Disability and Transit Services: Assists with nutrition, care and transit needs. [phone]

## 2015-12-09 NOTE — Progress Notes (Signed)
Natalie Ball Tolerated chemotherapy well today  Discharged ambulatory   Pt had a red area come up on left arm, Tom kefalas notified, by the end of treatment the area had resolved.  Pump connected.

## 2015-12-11 ENCOUNTER — Encounter (HOSPITAL_COMMUNITY): Payer: Self-pay

## 2015-12-11 ENCOUNTER — Encounter (HOSPITAL_BASED_OUTPATIENT_CLINIC_OR_DEPARTMENT_OTHER): Payer: BLUE CROSS/BLUE SHIELD

## 2015-12-11 VITALS — BP 104/53 | HR 65 | Temp 98.5°F | Resp 18

## 2015-12-11 DIAGNOSIS — Z5189 Encounter for other specified aftercare: Secondary | ICD-10-CM | POA: Diagnosis not present

## 2015-12-11 DIAGNOSIS — C801 Malignant (primary) neoplasm, unspecified: Secondary | ICD-10-CM | POA: Diagnosis not present

## 2015-12-11 MED ORDER — HEPARIN SOD (PORK) LOCK FLUSH 100 UNIT/ML IV SOLN
500.0000 [IU] | Freq: Once | INTRAVENOUS | Status: AC | PRN
  Administered 2015-12-11: 500 [IU]

## 2015-12-11 MED ORDER — SODIUM CHLORIDE 0.9% FLUSH
10.0000 mL | INTRAVENOUS | Status: DC | PRN
  Administered 2015-12-11: 10 mL
  Filled 2015-12-11: qty 10

## 2015-12-11 MED ORDER — HEPARIN SOD (PORK) LOCK FLUSH 100 UNIT/ML IV SOLN
INTRAVENOUS | Status: AC
  Filled 2015-12-11: qty 5

## 2015-12-11 MED ORDER — PEGFILGRASTIM 6 MG/0.6ML ~~LOC~~ PSKT
6.0000 mg | PREFILLED_SYRINGE | Freq: Once | SUBCUTANEOUS | Status: AC
  Administered 2015-12-11: 6 mg via SUBCUTANEOUS

## 2015-12-11 MED ORDER — PEGFILGRASTIM 6 MG/0.6ML ~~LOC~~ PSKT
PREFILLED_SYRINGE | SUBCUTANEOUS | Status: AC
  Filled 2015-12-11: qty 0.6

## 2015-12-11 NOTE — Patient Instructions (Signed)
[place] at [facility] Discharge Instructions  RECOMMENDATIONS MADE BY THE CONSULTANT AND ANY TEST RESULTS WILL BE SENT TO YOUR REFERRING PHYSICIAN.  Pump removed neulasta on pro today Follow up as scheduled  Please call the clinic if you have any questions or concerns   Thank you for choosing [place] at [facility] to provide your oncology and hematology care.  To afford each patient quality time with our provider, please arrive at least 15 minutes before your scheduled appointment time.   Beginning January 23rd 2017 lab work for the [facility] will be done in the  Main lab at [facility] on 1st floor. If you have a lab appointment with the [place] please come in thru the  Main Entrance and check in at the main information desk  You need to re-schedule your appointment should you arrive 10 or more minutes late.  We strive to give you quality time with our providers, and arriving late affects you and other patients whose appointments are after yours.  Also, if you no show three or more times for appointments you may be dismissed from the clinic at the providers discretion.     Again, thank you for choosing [facility].  Our hope is that these requests will decrease the amount of time that you wait before being seen by our physicians.       _____________________________________________________________  Should you have questions after your visit to [facility], please contact our office at [phone] between the hours of 8:30 a.m. and 4:30 p.m.  Voicemails left after 4:30 p.m. will not be returned until the following business day.  For prescription refill requests, have your pharmacy contact our office.         Resources For Cancer Patients and their Caregivers ? American Cancer Society: Can assist with transportation, wigs, general needs, runs Look Good Feel Better.        [phone] ? Cancer  Care: Provides financial assistance, online support groups, medication/co-pay assistance.  1-800-813-HOPE 630-468-0592) ? [place] Assists [place] cancer patients and their families through emotional , educational and financial support.  ([phone] ? [place] DSS Where to apply for food stamps, Medicaid and utility assistance. 747-421-6323 ? RCATS: Transportation to medical appointments. 940 400 6460 ? Social Security Administration: May apply for disability if have a Stage IV cancer. [phone] ? [employer], Disability and Transit Services: Assists with nutrition, care and transit needs. [place] Support Programs: @10RELATIVEDAYS @ > Cancer Support Group  2nd Tuesday of the month 1pm-2pm, Journey Room  > Creative Journey  3rd Tuesday of the month 1130am-1pm, Journey Room  > Look Good Feel Better  1st Wednesday of the month 10am-12 noon, Journey Room (Call [place] to register [phone])

## 2015-12-11 NOTE — Progress Notes (Signed)
Horton Marshall presented for Portacath de-access and flush.  Proper placement of portacath confirmed by CXR.  Portacath located left chest wall accessed with  H 20 needle.  Good blood return present. Portacath flushed with 54ml NS and 500U/30ml Heparin and needle removed intact.  Procedure tolerated well and without incident.  Pump removed.

## 2015-12-23 ENCOUNTER — Encounter (HOSPITAL_COMMUNITY): Payer: BLUE CROSS/BLUE SHIELD | Attending: Hematology & Oncology

## 2015-12-23 VITALS — BP 145/58 | HR 82 | Temp 97.8°F | Resp 16 | Wt 123.4 lb

## 2015-12-23 DIAGNOSIS — Z95828 Presence of other vascular implants and grafts: Secondary | ICD-10-CM

## 2015-12-23 DIAGNOSIS — C801 Malignant (primary) neoplasm, unspecified: Secondary | ICD-10-CM | POA: Diagnosis present

## 2015-12-23 LAB — COMPREHENSIVE METABOLIC PANEL
ALBUMIN: 3.8 g/dL (ref 3.5–5.0)
ALK PHOS: 255 U/L — AB (ref 38–126)
ALT: 53 U/L (ref 14–54)
AST: 45 U/L — AB (ref 15–41)
Anion gap: 6 (ref 5–15)
BILIRUBIN TOTAL: 0.7 mg/dL (ref 0.3–1.2)
BUN: 14 mg/dL (ref 6–20)
CALCIUM: 9.1 mg/dL (ref 8.9–10.3)
CO2: 25 mmol/L (ref 22–32)
CREATININE: 0.63 mg/dL (ref 0.44–1.00)
Chloride: 107 mmol/L (ref 101–111)
GFR calc Af Amer: >60 mL/min (ref >60)
GFR calc non Af Amer: >60 mL/min (ref >60)
GLUCOSE: 160 mg/dL — AB (ref 65–99)
Potassium: 3.6 mmol/L (ref 3.5–5.1)
Sodium: 138 mmol/L (ref 135–145)
TOTAL PROTEIN: 6.8 g/dL (ref 6.5–8.1)

## 2015-12-23 LAB — CBC WITH DIFFERENTIAL/PLATELET
BASOS ABS: 0 10*3/uL (ref 0.0–0.1)
BASOS PCT: 0 %
Eosinophils Absolute: 0.1 10*3/uL (ref 0.0–0.7)
Eosinophils Relative: 1 %
HEMATOCRIT: 32.7 % — AB (ref 36.0–46.0)
HEMOGLOBIN: 10.9 g/dL — AB (ref 12.0–15.0)
Lymphocytes Relative: 13 %
Lymphs Abs: 1.3 10*3/uL (ref 0.7–4.0)
MCH: 34.2 pg — ABNORMAL HIGH (ref 26.0–34.0)
MCHC: 33.3 g/dL (ref 30.0–36.0)
MCV: 102.5 fL — ABNORMAL HIGH (ref 78.0–100.0)
MONOS PCT: 5 %
Monocytes Absolute: 0.5 10*3/uL (ref 0.1–1.0)
NEUTROS ABS: 8.1 10*3/uL — AB (ref 1.7–7.7)
NEUTROS PCT: 81 %
Platelets: 87 10*3/uL — ABNORMAL LOW (ref 150–400)
RBC: 3.19 MIL/uL — ABNORMAL LOW (ref 3.87–5.11)
RDW: 15 % (ref 11.5–15.5)
WBC: 10.1 10*3/uL (ref 4.0–10.5)

## 2015-12-23 MED ORDER — HEPARIN SOD (PORK) LOCK FLUSH 100 UNIT/ML IV SOLN
INTRAVENOUS | Status: AC
  Filled 2015-12-23: qty 5

## 2015-12-23 MED ORDER — SODIUM CHLORIDE 0.9% FLUSH
10.0000 mL | Freq: Once | INTRAVENOUS | Status: AC
  Administered 2015-12-23: 10 mL via INTRAVENOUS

## 2015-12-23 MED ORDER — HEPARIN SOD (PORK) LOCK FLUSH 100 UNIT/ML IV SOLN
500.0000 [IU] | Freq: Once | INTRAVENOUS | Status: AC
  Administered 2015-12-23: 500 [IU] via INTRAVENOUS

## 2015-12-23 NOTE — Progress Notes (Signed)
Labs reviewed with Dr.Penland. Defer chemo x 1 week due to low platelets. MD will dose reduce chemo for next treatment. Natalie Ball presented for Portacath access and flush. Proper placement of portacath confirmed by CXR. Portacath located left chest wall accessed with  H 20 needle. Good blood return present. Portacath flushed with 33ml NS and 500U/53ml Heparin and needle removed intact. Procedure without incident. Patient tolerated procedure well.

## 2015-12-23 NOTE — Patient Instructions (Signed)
[place] at [facility] Discharge Instructions  RECOMMENDATIONS MADE BY THE CONSULTANT AND ANY TEST RESULTS WILL BE SENT TO YOUR REFERRING PHYSICIAN.  Chemo will be deferred until next week due to low platelets. Dr.Penland plans to dose reduce chemo at next treatment.  Thank you for choosing [place] at [facility] to provide your oncology and hematology care.  To afford each patient quality time with our provider, please arrive at least 15 minutes before your scheduled appointment time.   Beginning January 23rd 2017 lab work for the [facility] will be done in the  Main lab at [facility] on 1st floor. If you have a lab appointment with the [place] please come in thru the  Main Entrance and check in at the main information desk  You need to re-schedule your appointment should you arrive 10 or more minutes late.  We strive to give you quality time with our providers, and arriving late affects you and other patients whose appointments are after yours.  Also, if you no show three or more times for appointments you may be dismissed from the clinic at the providers discretion.     Again, thank you for choosing [facility].  Our hope is that these requests will decrease the amount of time that you wait before being seen by our physicians.       _____________________________________________________________  Should you have questions after your visit to [facility], please contact our office at [phone] between the hours of 8:30 a.m. and 4:30 p.m.  Voicemails left after 4:30 p.m. will not be returned until the following business day.  For prescription refill requests, have your pharmacy contact our office.         Resources For Cancer Patients and their Caregivers ? American Cancer Society: Can assist with transportation, wigs, general needs, runs Look Good Feel Better.        508-229-0791 ? Cancer  Care: Provides financial assistance, online support groups, medication/co-pay assistance.  1-800-813-HOPE (605)065-8320) ? [place] Assists [place] cancer patients and their families through emotional , educational and financial support.  [phone] ? [place] DSS Where to apply for food stamps, Medicaid and utility assistance. 312 260 4227 ? RCATS: Transportation to medical appointments. [phone] ? Social Security Administration: May apply for disability if have a Stage IV cancer. [phone] [phone] ? [employer], Disability and Transit Services: Assists with nutrition, care and transit needs. [place] Support Programs: @10RELATIVEDAYS @ > Cancer Support Group  2nd Tuesday of the month 1pm-2pm, Journey Room  > Creative Journey  3rd Tuesday of the month 1130am-1pm, Journey Room  > Look Good Feel Better  1st Wednesday of the month 10am-12 noon, Journey Room (Call [place] to register [phone])

## 2015-12-24 LAB — CEA: CEA: 2.3 ng/mL (ref 0.0–4.7)

## 2015-12-24 LAB — CA 125: CA 125: 18.4 U/mL (ref 0.0–38.1)

## 2015-12-25 ENCOUNTER — Encounter (HOSPITAL_COMMUNITY): Payer: BLUE CROSS/BLUE SHIELD

## 2015-12-30 ENCOUNTER — Encounter (HOSPITAL_COMMUNITY): Payer: Self-pay

## 2015-12-30 ENCOUNTER — Encounter (HOSPITAL_BASED_OUTPATIENT_CLINIC_OR_DEPARTMENT_OTHER): Payer: BLUE CROSS/BLUE SHIELD

## 2015-12-30 VITALS — BP 121/63 | HR 64 | Temp 97.6°F | Resp 18 | Wt 123.8 lb

## 2015-12-30 DIAGNOSIS — C801 Malignant (primary) neoplasm, unspecified: Secondary | ICD-10-CM | POA: Diagnosis not present

## 2015-12-30 DIAGNOSIS — Z5111 Encounter for antineoplastic chemotherapy: Secondary | ICD-10-CM | POA: Diagnosis not present

## 2015-12-30 LAB — CBC WITH DIFFERENTIAL/PLATELET
BASOS ABS: 0 10*3/uL (ref 0.0–0.1)
Basophils Relative: 1 %
EOS PCT: 1 %
Eosinophils Absolute: 0 10*3/uL (ref 0.0–0.7)
HEMATOCRIT: 33.8 % — AB (ref 36.0–46.0)
Hemoglobin: 11.4 g/dL — ABNORMAL LOW (ref 12.0–15.0)
LYMPHS ABS: 0.9 10*3/uL (ref 0.7–4.0)
LYMPHS PCT: 25 %
MCH: 34.2 pg — AB (ref 26.0–34.0)
MCHC: 33.7 g/dL (ref 30.0–36.0)
MCV: 101.5 fL — AB (ref 78.0–100.0)
MONO ABS: 0.5 10*3/uL (ref 0.1–1.0)
Monocytes Relative: 14 %
NEUTROS ABS: 2.1 10*3/uL (ref 1.7–7.7)
Neutrophils Relative %: 59 %
PLATELETS: 172 10*3/uL (ref 150–400)
RBC: 3.33 MIL/uL — ABNORMAL LOW (ref 3.87–5.11)
RDW: 14.6 % (ref 11.5–15.5)
WBC: 3.5 10*3/uL — ABNORMAL LOW (ref 4.0–10.5)

## 2015-12-30 LAB — COMPREHENSIVE METABOLIC PANEL
ALT: 59 U/L — AB (ref 14–54)
AST: 54 U/L — AB (ref 15–41)
Albumin: 3.9 g/dL (ref 3.5–5.0)
Alkaline Phosphatase: 201 U/L — ABNORMAL HIGH (ref 38–126)
Anion gap: 3 — ABNORMAL LOW (ref 5–15)
BILIRUBIN TOTAL: 0.7 mg/dL (ref 0.3–1.2)
BUN: 16 mg/dL (ref 6–20)
CHLORIDE: 109 mmol/L (ref 101–111)
CO2: 25 mmol/L (ref 22–32)
CREATININE: 0.61 mg/dL (ref 0.44–1.00)
Calcium: 9.1 mg/dL (ref 8.9–10.3)
Glucose, Bld: 130 mg/dL — ABNORMAL HIGH (ref 65–99)
POTASSIUM: 3.9 mmol/L (ref 3.5–5.1)
Sodium: 137 mmol/L (ref 135–145)
TOTAL PROTEIN: 6.9 g/dL (ref 6.5–8.1)

## 2015-12-30 MED ORDER — OXALIPLATIN CHEMO INJECTION 100 MG/20ML
75.0000 mg/m2 | Freq: Once | INTRAVENOUS | Status: AC
  Administered 2015-12-30: 120 mg via INTRAVENOUS
  Filled 2015-12-30: qty 24

## 2015-12-30 MED ORDER — DEXTROSE 5 % IV SOLN
Freq: Once | INTRAVENOUS | Status: AC
  Administered 2015-12-30: 10:00:00 via INTRAVENOUS

## 2015-12-30 MED ORDER — FLUOROURACIL CHEMO INJECTION 5 GM/100ML
1920.0000 mg/m2 | INTRAVENOUS | Status: DC
  Administered 2015-12-30: 3150 mg via INTRAVENOUS
  Filled 2015-12-30: qty 63

## 2015-12-30 MED ORDER — HEPARIN SOD (PORK) LOCK FLUSH 100 UNIT/ML IV SOLN: 500.0000 [IU] | Freq: Once | INTRAVENOUS | Status: DC | PRN

## 2015-12-30 MED ORDER — SODIUM CHLORIDE 0.9% FLUSH: 10.0000 mL | INTRAVENOUS | Status: DC | PRN

## 2015-12-30 MED ORDER — PALONOSETRON HCL INJECTION 0.25 MG/5ML
0.2500 mg | Freq: Once | INTRAVENOUS | Status: AC
  Administered 2015-12-30: 0.25 mg via INTRAVENOUS
  Filled 2015-12-30: qty 5

## 2015-12-30 MED ORDER — METHYLPREDNISOLONE SODIUM SUCC 40 MG IJ SOLR
40.0000 mg | Freq: Once | INTRAMUSCULAR | Status: AC
  Administered 2015-12-30: 40 mg via INTRAVENOUS

## 2015-12-30 MED ORDER — DIPHENHYDRAMINE HCL 50 MG/ML IJ SOLN
INTRAMUSCULAR | Status: AC
  Filled 2015-12-30: qty 1

## 2015-12-30 MED ORDER — LEUCOVORIN CALCIUM INJECTION 350 MG
400.0000 mg/m2 | Freq: Once | INTRAVENOUS | Status: AC
  Administered 2015-12-30: 652 mg via INTRAVENOUS
  Filled 2015-12-30: qty 32.6

## 2015-12-30 MED ORDER — DIPHENHYDRAMINE HCL 50 MG/ML IJ SOLN
25.0000 mg | Freq: Once | INTRAMUSCULAR | Status: AC
  Administered 2015-12-30: 25 mg via INTRAVENOUS

## 2015-12-30 MED ORDER — LORAZEPAM 2 MG/ML IJ SOLN
0.5000 mg | Freq: Once | INTRAMUSCULAR | Status: AC
  Administered 2015-12-30: 0.5 mg via INTRAVENOUS
  Filled 2015-12-30: qty 1

## 2015-12-30 MED ORDER — SODIUM CHLORIDE 0.9 % IV SOLN
Freq: Once | INTRAVENOUS | Status: AC
  Administered 2015-12-30: 10:00:00 via INTRAVENOUS
  Filled 2015-12-30: qty 5

## 2015-12-30 MED ORDER — METHYLPREDNISOLONE SODIUM SUCC 40 MG IJ SOLR
INTRAMUSCULAR | Status: AC
  Filled 2015-12-30: qty 1

## 2015-12-30 NOTE — Progress Notes (Signed)
Natalie Ball Tolerated chemotherapy well today Discharged ambulatory with pump

## 2015-12-30 NOTE — Patient Instructions (Signed)
[facility] Discharge Instructions for Patients Receiving Chemotherapy   Beginning January 23rd 2017 lab work for the [facility] will be done in the  Main lab at [facility] on 1st floor. If you have a lab appointment with the [place] please come in thru the  Main Entrance and check in at the main information desk   Today you received the following chemotherapy agents FOLFOX  Follow up as scheduled  Please call the clinic if you have any questions or concerns   To help prevent nausea and vomiting after your treatment, we encourage you to take your nausea medication    If you develop nausea and vomiting, or diarrhea that is not controlled by your medication, call the clinic.  The clinic phone number is [phone]. Office hours are Monday-Friday 8:30am-5:00pm.  BELOW ARE SYMPTOMS THAT SHOULD BE REPORTED IMMEDIATELY:  *FEVER GREATER THAN 101.0 F  *CHILLS WITH OR WITHOUT FEVER  NAUSEA AND VOMITING THAT IS NOT CONTROLLED WITH YOUR NAUSEA MEDICATION  *UNUSUAL SHORTNESS OF BREATH  *UNUSUAL BRUISING OR BLEEDING  TENDERNESS IN MOUTH AND THROAT WITH OR WITHOUT PRESENCE OF ULCERS  *URINARY PROBLEMS  *BOWEL PROBLEMS  UNUSUAL RASH Items with * indicate a potential emergency and should be followed up as soon as possible. If you have an emergency after office hours please contact your primary care physician or go to the nearest emergency department.  Please call the clinic during office hours if you have any questions or concerns.   You may also contact the Patient Navigator at [phone] should you have any questions or need assistance in obtaining follow up care.      Resources For Cancer Patients and their Caregivers ? American Cancer Society: Can assist with transportation, wigs, general needs, runs Look Good Feel Better.        [phone] ? Cancer Care: Provides financial assistance, online support groups, medication/co-pay assistance.   1-800-813-HOPE 323-161-6930) ? [place] Assists [place] cancer patients and their families through emotional , educational and financial support.  260-159-6255 ? [place] DSS Where to apply for food stamps, Medicaid and utility assistance. 343-872-4207 ? RCATS: Transportation to medical appointments. 516-227-3940 ? Social Security Administration: May apply for disability if have a Stage IV cancer. [phone] ? [employer], Disability and Transit Services: Assists with nutrition, care and transit needs. [phone]

## 2016-01-01 ENCOUNTER — Encounter (HOSPITAL_BASED_OUTPATIENT_CLINIC_OR_DEPARTMENT_OTHER): Payer: BLUE CROSS/BLUE SHIELD

## 2016-01-01 VITALS — BP 111/64 | HR 66 | Temp 98.2°F | Resp 18

## 2016-01-01 DIAGNOSIS — C801 Malignant (primary) neoplasm, unspecified: Secondary | ICD-10-CM | POA: Diagnosis not present

## 2016-01-01 DIAGNOSIS — Z5189 Encounter for other specified aftercare: Secondary | ICD-10-CM

## 2016-01-01 MED ORDER — PEGFILGRASTIM 6 MG/0.6ML ~~LOC~~ PSKT
6.0000 mg | PREFILLED_SYRINGE | Freq: Once | SUBCUTANEOUS | Status: AC
  Administered 2016-01-01: 6 mg via SUBCUTANEOUS

## 2016-01-01 MED ORDER — LORAZEPAM 2 MG/ML IJ SOLN
INTRAMUSCULAR | Status: AC
  Filled 2016-01-01: qty 1

## 2016-01-01 MED ORDER — PEGFILGRASTIM 6 MG/0.6ML ~~LOC~~ PSKT
PREFILLED_SYRINGE | SUBCUTANEOUS | Status: AC
  Filled 2016-01-01: qty 0.6

## 2016-01-01 MED ORDER — SODIUM CHLORIDE 0.9% FLUSH
10.0000 mL | INTRAVENOUS | Status: DC | PRN
  Administered 2016-01-01: 10 mL
  Filled 2016-01-01: qty 10

## 2016-01-01 MED ORDER — HEPARIN SOD (PORK) LOCK FLUSH 100 UNIT/ML IV SOLN
500.0000 [IU] | Freq: Once | INTRAVENOUS | Status: AC | PRN
  Administered 2016-01-01: 500 [IU]

## 2016-01-01 MED ORDER — LORAZEPAM 2 MG/ML IJ SOLN: 0.5000 mg | Freq: Once | INTRAMUSCULAR | Status: DC

## 2016-01-01 MED ORDER — HEPARIN SOD (PORK) LOCK FLUSH 100 UNIT/ML IV SOLN
INTRAVENOUS | Status: AC
  Filled 2016-01-01: qty 5

## 2016-01-01 NOTE — Patient Instructions (Signed)
[place] at [facility] Discharge Instructions  RECOMMENDATIONS MADE BY THE CONSULTANT AND ANY TEST RESULTS WILL BE SENT TO YOUR REFERRING PHYSICIAN.  Port flush with pump removal today.    Thank you for choosing [place] at [facility] to provide your oncology and hematology care.  To afford each patient quality time with our provider, please arrive at least 15 minutes before your scheduled appointment time.   Beginning January 23rd 2017 lab work for the [facility] will be done in the  Main lab at [facility] on 1st floor. If you have a lab appointment with the [place] please come in thru the  Main Entrance and check in at the main information desk  You need to re-schedule your appointment should you arrive 10 or more minutes late.  We strive to give you quality time with our providers, and arriving late affects you and other patients whose appointments are after yours.  Also, if you no show three or more times for appointments you may be dismissed from the clinic at the providers discretion.     Again, thank you for choosing [facility].  Our hope is that these requests will decrease the amount of time that you wait before being seen by our physicians.       _____________________________________________________________  Should you have questions after your visit to [facility], please contact our office at [phone] between the hours of 8:30 a.m. and 4:30 p.m.  Voicemails left after 4:30 p.m. will not be returned until the following business day.  For prescription refill requests, have your pharmacy contact our office.         Resources For Cancer Patients and their Caregivers ? American Cancer Society: Can assist with transportation, wigs, general needs, runs Look Good Feel Better.        ([phone] ? Cancer Care: Provides financial assistance, online support groups, medication/co-pay  assistance.  1-800-813-HOPE (415)012-5398) ? [place] Assists [place] cancer patients and their families through emotional , educational and financial support.  980-681-3190 ? [place] DSS Where to apply for food stamps, Medicaid and utility assistance. 313-117-2989 ? RCATS: Transportation to medical appointments. 778-439-7535 ? Social Security Administration: May apply for disability if have a Stage IV cancer. [phone] ? [employer], Disability and Transit Services: Assists with nutrition, care and transit needs. [place] Support Programs: @10RELATIVEDAYS @ > Cancer Support Group  2nd Tuesday of the month 1pm-2pm, Journey Room  > Creative Journey  3rd Tuesday of the month 1130am-1pm, Journey Room  > Look Good Feel Better  1st Wednesday of the month 10am-12 noon, Journey Room (Call [place] to register [phone])

## 2016-01-01 NOTE — Progress Notes (Signed)
Patient arrives to have her home infusion pump removed and port flushed today.  She prefers not to have the IV Ativan because it "knocks her out" and she would rather just take what she has at home.  She states that she has all of her nausea medication at home and it controls her nausea well.  VSS.  No difficulty or issues with the home chemo infusion.  Port flushed with 48ml normal saline and 500units of heparin per protocol and needle removed.  Neulasta OnPro applied to patient's right arm and she is able to verbalize how and when to remove it as well as what to do if it falls off.

## 2016-01-06 ENCOUNTER — Ambulatory Visit (HOSPITAL_COMMUNITY): Payer: BLUE CROSS/BLUE SHIELD | Admitting: Hematology & Oncology

## 2016-01-06 ENCOUNTER — Ambulatory Visit (HOSPITAL_COMMUNITY): Payer: BLUE CROSS/BLUE SHIELD

## 2016-01-08 ENCOUNTER — Encounter (HOSPITAL_COMMUNITY): Payer: BLUE CROSS/BLUE SHIELD

## 2016-01-13 ENCOUNTER — Encounter (HOSPITAL_COMMUNITY): Payer: Self-pay | Admitting: Hematology & Oncology

## 2016-01-13 ENCOUNTER — Encounter (HOSPITAL_COMMUNITY): Payer: BLUE CROSS/BLUE SHIELD

## 2016-01-13 ENCOUNTER — Encounter (HOSPITAL_COMMUNITY): Payer: BLUE CROSS/BLUE SHIELD | Attending: Hematology & Oncology | Admitting: Hematology & Oncology

## 2016-01-13 ENCOUNTER — Ambulatory Visit (HOSPITAL_COMMUNITY): Payer: BLUE CROSS/BLUE SHIELD

## 2016-01-13 VITALS — BP 114/45 | HR 70 | Temp 98.1°F | Resp 16 | Wt 124.4 lb

## 2016-01-13 DIAGNOSIS — C801 Malignant (primary) neoplasm, unspecified: Secondary | ICD-10-CM

## 2016-01-13 DIAGNOSIS — G620 Drug-induced polyneuropathy: Secondary | ICD-10-CM | POA: Diagnosis not present

## 2016-01-13 DIAGNOSIS — D696 Thrombocytopenia, unspecified: Secondary | ICD-10-CM

## 2016-01-13 DIAGNOSIS — T451X5A Adverse effect of antineoplastic and immunosuppressive drugs, initial encounter: Secondary | ICD-10-CM

## 2016-01-13 DIAGNOSIS — D6959 Other secondary thrombocytopenia: Secondary | ICD-10-CM | POA: Diagnosis not present

## 2016-01-13 DIAGNOSIS — R110 Nausea: Secondary | ICD-10-CM

## 2016-01-13 LAB — CBC WITH DIFFERENTIAL/PLATELET
Basophils Absolute: 0 10*3/uL (ref 0.0–0.1)
Basophils Relative: 0 %
Eosinophils Absolute: 0.2 10*3/uL (ref 0.0–0.7)
Eosinophils Relative: 2 %
HCT: 32.3 % — ABNORMAL LOW (ref 36.0–46.0)
HEMOGLOBIN: 10.8 g/dL — AB (ref 12.0–15.0)
LYMPHS ABS: 1.1 10*3/uL (ref 0.7–4.0)
LYMPHS PCT: 11 %
MCH: 34.4 pg — AB (ref 26.0–34.0)
MCHC: 33.4 g/dL (ref 30.0–36.0)
MCV: 102.9 fL — AB (ref 78.0–100.0)
Monocytes Absolute: 0.6 10*3/uL (ref 0.1–1.0)
Monocytes Relative: 5 %
NEUTROS ABS: 8.5 10*3/uL — AB (ref 1.7–7.7)
NEUTROS PCT: 82 %
Platelets: 77 10*3/uL — ABNORMAL LOW (ref 150–400)
RBC: 3.14 MIL/uL — AB (ref 3.87–5.11)
RDW: 14.5 % (ref 11.5–15.5)
WBC: 10.3 10*3/uL (ref 4.0–10.5)

## 2016-01-13 LAB — COMPREHENSIVE METABOLIC PANEL
ALK PHOS: 242 U/L — AB (ref 38–126)
ALT: 55 U/L — AB (ref 14–54)
AST: 49 U/L — ABNORMAL HIGH (ref 15–41)
Albumin: 3.8 g/dL (ref 3.5–5.0)
Anion gap: 4 — ABNORMAL LOW (ref 5–15)
BUN: 15 mg/dL (ref 6–20)
CALCIUM: 9.2 mg/dL (ref 8.9–10.3)
CO2: 27 mmol/L (ref 22–32)
CREATININE: 0.61 mg/dL (ref 0.44–1.00)
Chloride: 108 mmol/L (ref 101–111)
Glucose, Bld: 158 mg/dL — ABNORMAL HIGH (ref 65–99)
Potassium: 3.9 mmol/L (ref 3.5–5.1)
SODIUM: 139 mmol/L (ref 135–145)
Total Bilirubin: 0.6 mg/dL (ref 0.3–1.2)
Total Protein: 7 g/dL (ref 6.5–8.1)

## 2016-01-13 MED ORDER — HEPARIN SOD (PORK) LOCK FLUSH 100 UNIT/ML IV SOLN
500.0000 [IU] | Freq: Once | INTRAVENOUS | Status: AC
  Administered 2016-01-13: 500 [IU] via INTRAVENOUS

## 2016-01-13 MED ORDER — HEPARIN SOD (PORK) LOCK FLUSH 100 UNIT/ML IV SOLN
INTRAVENOUS | Status: AC
  Filled 2016-01-13: qty 5

## 2016-01-13 NOTE — Progress Notes (Signed)
No treatment due to platelet count

## 2016-01-13 NOTE — Patient Instructions (Signed)
[place] at [facility] Discharge Instructions  RECOMMENDATIONS MADE BY THE CONSULTANT AND ANY TEST RESULTS WILL BE SENT TO YOUR REFERRING PHYSICIAN.  Exam done and seen today by Dr. Gustavus Bryant done with treatment today. CT scans in sept. Return to see the doctor after scans with labs Please call the clinic if you have any questions or concerns  Thank you for choosing [place] at [facility] to provide your oncology and hematology care.  To afford each patient quality time with our provider, please arrive at least 15 minutes before your scheduled appointment time.   Beginning January 23rd 2017 lab work for the [facility] will be done in the  Main lab at [facility] on 1st floor. If you have a lab appointment with the [place] please come in thru the  Main Entrance and check in at the main information desk  You need to re-schedule your appointment should you arrive 10 or more minutes late.  We strive to give you quality time with our providers, and arriving late affects you and other patients whose appointments are after yours.  Also, if you no show three or more times for appointments you may be dismissed from the clinic at the providers discretion.     Again, thank you for choosing [facility].  Our hope is that these requests will decrease the amount of time that you wait before being seen by our physicians.       _____________________________________________________________  Should you have questions after your visit to [facility], please contact our office at [phone] between the hours of 8:30 a.m. and 4:30 p.m.  Voicemails left after 4:30 p.m. will not be returned until the following business day.  For prescription refill requests, have your pharmacy contact our office.         Resources For Cancer Patients and their Caregivers ? American Cancer Society: Can assist with transportation,  wigs, general needs, runs Look Good Feel Better.        [phone] ? Cancer Care: Provides financial assistance, online support groups, medication/co-pay assistance.  1-800-813-HOPE 770-315-0499) ? [place] Assists [place] cancer patients and their families through emotional , educational and financial support.  701 132 6772 ? [place] DSS Where to apply for food stamps, Medicaid and utility assistance. 805-800-9914 ? RCATS: Transportation to medical appointments. 626-160-2060 ? Social Security Administration: May apply for disability if have a Stage IV cancer. [phone] ? [employer], Disability and Transit Services: Assists with nutrition, care and transit needs. [place] Support Programs: @10RELATIVEDAYS @ > Cancer Support Group  2nd Tuesday of the month 1pm-2pm, Journey Room  > Creative Journey  3rd Tuesday of the month 1130am-1pm, Journey Room  > Look Good Feel Better  1st Wednesday of the month 10am-12 noon, Journey Room (Call [place] to register [phone])

## 2016-01-13 NOTE — Patient Instructions (Addendum)
[facility] Discharge Instructions for Patients Receiving Chemotherapy   Beginning January 23rd 2017 lab work for the [facility] will be done in the  Main lab at [facility] on 1st floor. If you have a lab appointment with the [place] please come in thru the  Main Entrance and check in at the main information desk   Today you received the following chemotherapy agents leucovorin and 52fu-no treatment today We did not give you the oxaliplatin today Follow up as scheduled Please call the clinic if you have any questions or concerns   To help prevent nausea and vomiting after your treatment, we encourage you to take your nausea medication     If you develop nausea and vomiting, or diarrhea that is not controlled by your medication, call the clinic.  The clinic phone number is [phone]. Office hours are Monday-Friday 8:30am-5:00pm.  BELOW ARE SYMPTOMS THAT SHOULD BE REPORTED IMMEDIATELY:  *FEVER GREATER THAN 101.0 F  *CHILLS WITH OR WITHOUT FEVER  NAUSEA AND VOMITING THAT IS NOT CONTROLLED WITH YOUR NAUSEA MEDICATION  *UNUSUAL SHORTNESS OF BREATH  *UNUSUAL BRUISING OR BLEEDING  TENDERNESS IN MOUTH AND THROAT WITH OR WITHOUT PRESENCE OF ULCERS  *URINARY PROBLEMS  *BOWEL PROBLEMS  UNUSUAL RASH Items with * indicate a potential emergency and should be followed up as soon as possible. If you have an emergency after office hours please contact your primary care physician or go to the nearest emergency department.  Please call the clinic during office hours if you have any questions or concerns.   You may also contact the Patient Navigator at [phone] should you have any questions or need assistance in obtaining follow up care.      Resources For Cancer Patients and their Caregivers ? American Cancer Society: Can assist with transportation, wigs, general needs, runs Look Good Feel Better.        775-459-1877 ? Cancer Care: Provides  financial assistance, online support groups, medication/co-pay assistance.  1-800-813-HOPE 913-298-0728) ? [place] Assists [place] cancer patients and their families through emotional , educational and financial support.  336-376-6291 ? [place] DSS Where to apply for food stamps, Medicaid and utility assistance. 2894649973 ? RCATS: Transportation to medical appointments. 628-751-0741 ? Social Security Administration: May apply for disability if have a Stage IV cancer. 208-301-5925 203-756-1905 ? [employer], Disability and Transit Services: Assists with nutrition, care and transit needs. [phone]

## 2016-01-13 NOTE — Progress Notes (Signed)
[facility] [place], MD [address] 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 Persistent post operative elevation in CA-125 at 304.9 U/ml, normal post-op CEA  SUMMARY OF ONCOLOGIC HISTORY:   Mucinous adenocarcinoma ([place])   06/24/2015 Imaging    CT CAP- Complex cystic central pelvic mass remains suspicious for cystic ovarian carcinoma, most likely arising from the right ovary. Prominent right gonadal vasculature. Tiny hypodensity in the dome of the liver is too small to characterize, but stable.     06/26/2015 Pathology Results    Adnexa - ovary +/- tube, neoplastic, right - MUCINOUS ADENOCARCINOMA, 13 CM. - OVARIAN SURFACE NOT INVOLVED BY TUMOR. - UNREMARKABLE FALLOPIAN TUBE WITH NO ENDOMETRIOSIS OR MALIGNANCY.     06/26/2015 Procedure    Exploratory laparotomy with total hysterectomy bilateral salpingo-oophorectomy, pelvic and para-aortic lymph node dissection, omentectomy washings and biopsies for ovarian cancer by Dr. [name]     06/27/2015 Pathology Results    PERITONEAL WASHING(SPECIMEN 1 OF 1 COLLECTED 06/27/15): ATYPICAL CELLS. Immunohistochemistry calretinin, cytokeratin 5/6, estrogen receptor, progesterone receptor and WT-1 is performed. The morphology and immunophenotype favor reactive mesothelial cells     07/10/2015 Initial Diagnosis    Mucinous adenocarcinoma ([place])     07/22/2015 -  Chemotherapy    FOLFOX     07/24/2015 Survivorship    Genetic Counseling consultation with [name].     07/26/2015 Adverse Reaction    Loose stools, 2-3 per 24 hours     07/29/2015 Treatment Plan Change    5FU bolus discontinued due to loose stools.     08/05/2015 Adverse Reaction    Reaction to Oxaliplatin     09/01/2015 Treatment Plan Change    Decrease 5FU CI by 10%     09/30/2015 Treatment Plan Change    Oxaliplatin reduced by  10%     12/02/2015 Treatment Plan Change    Treatement deferred x 1 week, thrombocytopenia at 85,000      CURRENT THERAPY: FOLFOX  INTERVAL HISTORY: Natalie Ball 65 y.o. female returns for follow-up of mucinous adenocarcinoma. She is here for Cycle #9 FOLFOX.   Ms. Mcphail is unaccompanied. She is here for Cycle #12 FOLFOX.  This past week has been a lot better. Food is starting to taste a little better. Her mood is alright, as she approaches the end of treatment she is happy, "It doesn't get you anywhere to be negative".  She reports her feet feel as though they are falling asleep, "It isn't bad but it's there". "I feel like I'm walking on rocks but I'll be alright, I think". She has some trouble grabbing things with the tips of her fingers and admits to difficulty with buttons. She denies falls.   She reports an occasional 'twinge' in her abdomen where the cancer was. She is unable to have regular bowel movements without laxatives. She does not take Miralax but has some at home. She notes that the constipation has been a chronic issue since surgery.   She will see Dr. Britta Mccreedy sometime in August for a colonoscopy.  She will also be traveling to the beach in August with family.  No other major concerns today. She is just anxious to finish with therapy.  MEDICAL HISTORY: Past Medical History:  Diagnosis Date  . Allergy   . Complication of anesthesia   . Family history of colon cancer   .  Family history of ovarian cancer   . Family history of pancreatic cancer   . GERD (gastroesophageal reflux disease)   . Headache   . History of bronchitis   . Mucinous adenocarcinoma ([facility]) 07/10/2015  . Pneumonia   . PONV (postoperative nausea and vomiting)   . Tinnitus   . Urinary urgency   . Vertigo   . Wears glasses     has Mucinous adenocarcinoma ([facility]); Family history of ovarian cancer; Family history of pancreatic cancer; Family history of colon cancer; Genetic testing; and Nausea  without vomiting on her problem list.     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  @MEDADMINPROSE @  SURGICAL HISTORY: Past Surgical History:  Procedure Laterality Date  . ABDOMINAL HYSTERECTOMY N/A 06/26/2015   Procedure: TOTAL HYSTERECTOMY ABDOMINAL;  Surgeon: [name], MD;  Location: [facility];  Service: Gynecology;  Laterality: N/A;  . APPENDECTOMY    . DEBULKING N/A 06/26/2015   Procedure: bilateral periaortic lymph node dissection biopsies and peritoneal washings;  Surgeon: [name], MD;  Location: [facility];  Service: Gynecology;  Laterality: N/A;  . DIAGNOSTIC LAPAROSCOPY  1977  . LAPAROTOMY N/A 06/26/2015   Procedure: EXPLORATORY LAPAROTOMY;  Surgeon: [name], MD;  Location: [facility];  Service: Gynecology;  Laterality: N/A;  . OMENTECTOMY N/A 06/26/2015   Procedure:  OMENTECTOMY;  Surgeon: [name], MD;  Location: [facility];  Service: Gynecology;  Laterality: N/A;  . PORTACATH PLACEMENT Left 07/18/2015   Procedure: INSERTION PORT-A-CATH;  Surgeon: Aviva Signs, MD;  Location: [facility];  Service: General;  Laterality: Left;  pt knows to arrive at 6:15  . rectal fusula  1980  . SALPINGOOPHORECTOMY Bilateral 06/26/2015   Procedure: SALPINGO OOPHORECTOMY;  Surgeon: [name], MD;  Location: [facility];  Service: Gynecology;  Laterality: Bilateral;  . TUBAL LIGATION  1974    SOCIAL HISTORY: Social History   Social History  . Marital status: Widowed    Spouse name: N/A  . Number of children: 1  . Years of education: N/A   Occupational History  . Not on file.   Social History Main Topics  . Smoking status: Former Smoker    Packs/day: 1.00    Years: 19.00    Types: Cigarettes    Quit date: 06/14/1988  . Smokeless tobacco: Never Used  . Alcohol use Yes     Comment: occasionally   . Drug use: No  . Sexual activity: Not on file   Other Topics Concern  . Not on file   Social History Narrative  . No narrative on file  reports that she quit smoking about 27 years ago. Her smoking  use included Cigarettes. She has a 19 pack-year smoking history. She has never used smokeless tobacco. She reports that she drinks alcohol. She reports that she does not use illicit drugs. She reports that prior to her illness, she enjoyed an intermittent beer or marguirtia. She denies being a heavy drinker of EtOH. She notes that she is religious and she associates herselft with [place]. 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 She is widowed  FAMILY HISTORY: Family History  Problem Relation Age of Onset  . Cancer Father   . Colon polyps Father   . Ovarian cancer Maternal Aunt   . Colon cancer Maternal Aunt   . Pancreatic cancer Maternal Uncle   . Colon polyps Mother   . Colon polyps Sister   . Cancer Paternal Aunt     NOS  . Heart defect Maternal  Grandmother     hole in the heart  . COPD Maternal Grandfather   . Colon cancer Paternal Grandmother     dx possibly under 72  . Mental illness Sister   . Uterine cancer Maternal Aunt     dx possibly in her 69s  . Pancreatic cancer Maternal Aunt   . Cancer Paternal Aunt     NOS  . Cancer Cousin     adrenal cancer dx in his 75s; maternal cousin  . Ovarian cancer Cousin     dx in her 74s; maternal cousin  . Diabetes Daughter   Mother is alive at the age of 92 with heart disease, hypothyroidism, and valgus malformation of her ankles bilaterally inhibiting her ability to ambulate well. Father is 16 years old and alive with HTN and a mild form of dementia.  Sister is 42 yo with GERD Sister is 54 yo who was estranged from the patient until most recently. Sister 55 yo who has some sort of mental disorder requiring medications. She has 1 daughter who is 55 years old with DM and hypothyroidism. She has 1 granddaughter who is 10 yo who is overweight, but otherwise healthy. She is in college in [place], [place].  Review of Systems  Constitutional: Negative for fever, chills, weight loss and malaise/fatigue.  HENT:  Negative for congestion, hearing loss, nosebleeds, sore throat and tinnitus.   Eyes: Negative for blurred vision, double vision, pain and discharge.  Respiratory: Negative for cough, hemoptysis, sputum production, and wheezing.   Cardiovascular: Negative for chest pain, palpitations, claudication, leg swelling and PND.  Gastrointestinal: Positive for heartburn, abdominal pain, and constipation. Negative for nausea, vomiting, diarrhea, blood in stool and melena.  Heartburn managed with medication. Constipation managed with laxatives. Intermittent abdominal pain where her cancer was, this is unchanged. Genitourinary: Negative for dysuria, urgency, frequency and hematuria.  Musculoskeletal: Negative for myalgias, joint pain and falls.  Skin: Negative for itching and rash.  Neurological: Negative for dizziness, tingling, tremors, sensory change, speech change, focal weakness, seizures, loss of consciousness, weakness and headaches.  Endo/Heme/Allergies: Negative.  Psychiatric/Behavioral: Negative for depression, suicidal ideas, memory loss and substance abuse. The patient is not nervous/anxious and does not have insomnia.    14 point review of systems was performed and is negative except as detailed under history of present illness and above   PHYSICAL EXAMINATION  ECOG PERFORMANCE STATUS: 0 - Asymptomatic   Vitals with BMI 01/13/2016  Height   Weight 124 lbs 6 oz  BMI   Systolic 827  Diastolic 45  Pulse 70  Respirations 16    Physical Exam  Constitutional: She is oriented to person, place, and time and well-developed, well-nourished, and in no distress. hair thinning noted Well Groomed HENT:  Head: Normocephalic and atraumatic.  Nose: Nose normal.  Mouth/Throat: Oropharynx is clear and moist. No oropharyngeal exudate.  Eyes: Conjunctivae and EOM are normal. Pupils are equal, round, and reactive to light. Right eye exhibits no discharge. Left eye exhibits no discharge. No scleral  icterus.  Neck: Normal range of motion. Neck supple. No tracheal deviation present. No thyromegaly present.  Cardiovascular: Normal rate, regular rhythm and normal heart sounds.  Exam reveals no gallop and no friction rub.   No murmur heard. Pulmonary/Chest: Effort normal and breath sounds normal. She has no wheezes. She has no rales.  Abdominal: Soft. Bowel sounds are normal. She exhibits no distension and no mass. There is no tenderness. There is no rebound and no guarding.  Musculoskeletal:  Normal range of motion. She exhibits no edema.  Lymphadenopathy:    She has no cervical adenopathy.  Neurological: She is alert and oriented to person, place, and time. She has normal reflexes. No cranial nerve deficit. Gait normal. Coordination normal.  Skin: Skin is warm and dry. No rash noted. several echymoses on upper extremites Psychiatric: Mood, memory, affect and judgment normal.  Nursing note and vitals reviewed.   LABORATORY DATA: I have reviewed the data as listed.  Results for AUNNA, SNOOKS (MRN [id number]) as of 01/13/2016 09:42  Ref. Range 01/13/2016 08:27  Sodium Latest Ref Range: 135 - 145 mmol/L 139  Potassium Latest Ref Range: 3.5 - 5.1 mmol/L 3.9  Chloride Latest Ref Range: 101 - 111 mmol/L 108  CO2 Latest Ref Range: 22 - 32 mmol/L 27  BUN Latest Ref Range: 6 - 20 mg/dL 15  Creatinine Latest Ref Range: 0.44 - 1.00 mg/dL 0.61  Calcium Latest Ref Range: 8.9 - 10.3 mg/dL 9.2  EGFR (Non-African Amer.) Latest Ref Range: >60 mL/min >60  EGFR (African American) Latest Ref Range: >60 mL/min >60  Glucose Latest Ref Range: 65 - 99 mg/dL 158 (H)  Anion gap Latest Ref Range: 5 - 15  4 (L)  Alkaline Phosphatase Latest Ref Range: 38 - 126 U/L 242 (H)  Albumin Latest Ref Range: 3.5 - 5.0 g/dL 3.8  AST Latest Ref Range: 15 - 41 U/L 49 (H)  ALT Latest Ref Range: 14 - 54 U/L 55 (H)  Total Protein Latest Ref Range: 6.5 - 8.1 g/dL 7.0  Total Bilirubin Latest Ref Range: 0.3 - 1.2 mg/dL 0.6  WBC  Latest Ref Range: 4.0 - 10.5 K/uL 10.3  RBC Latest Ref Range: 3.87 - 5.11 MIL/uL 3.14 (L)  Hemoglobin Latest Ref Range: 12.0 - 15.0 g/dL 10.8 (L)  HCT Latest Ref Range: 36.0 - 46.0 % 32.3 (L)  MCV Latest Ref Range: 78.0 - 100.0 fL 102.9 (H)  MCH Latest Ref Range: 26.0 - 34.0 pg 34.4 (H)  MCHC Latest Ref Range: 30.0 - 36.0 g/dL 33.4  RDW Latest Ref Range: 11.5 - 15.5 % 14.5  Platelets Latest Ref Range: 150 - 400 K/uL 77 (L)  Neutrophils Latest Units: % 82  Lymphocytes Latest Units: % 11  Monocytes Relative Latest Units: % 5  Eosinophil Latest Units: % 2  Basophil Latest Units: % 0  NEUT# Latest Ref Range: 1.7 - 7.7 K/uL 8.5 (H)  Lymphocyte # Latest Ref Range: 0.7 - 4.0 K/uL 1.1  Monocyte # Latest Ref Range: 0.1 - 1.0 K/uL 0.6  Eosinophils Absolute Latest Ref Range: 0.0 - 0.7 K/uL 0.2  Basophils Absolute Latest Ref Range: 0.0 - 0.1 K/uL 0.0    RADIOGRAPHIC STUDIES: I have personally reviewed the radiological images as listed and agreed with the findings in the report. Study Result   CLINICAL DATA:  Status post port placement  EXAM: CHEST  2 VIEW  COMPARISON:  07/18/2015  FINDINGS: Cardiac shadow is stable. A left chest wall port is again seen. The lungs are well aerated bilaterally. Tiny apical pneumothorax is again seen on the left and stable. No new acute abnormality is noted.  IMPRESSION: Stable tiny left pneumothorax   Electronically Signed   By: Inez Catalina M.D.   On: 07/18/2015 09:18     PATHOLOGY:      ASSESSMENT and THERAPY PLAN:  Mucinous adenocarcinoma found on exploratory laparotomy, pathologically T1cN0M0. 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 This is her last scheduled dose of  therapy. Platelet count today is prohibitive. Based upon her neuropathy, I would also consider eliminating additional oxaliplatin. She has completed 11 cycles of FOLFOX and done quite well. She has had a few dose reductions secondary to counts.  I would recommend discontinuation of therapy at this point. I have arranged for repeat scans the first week of September. I will see her back post with repeat labs including CA-125 and CEA. MMR status of her disease is not known. Role of HIPEC is not known, but certainly could be discussed with Dr. Maxine Glenn at [facility].  She does not need any refills at this time.  Repeat CT scans at the end of August.  She will return for follow up the beginning of September to discuss imaging results.  All questions were answered. The patient knows to call the clinic with any problems, questions or concerns. We can certainly see the patient much sooner if necessary. . This document serves as a record of services personally performed by Ancil Linsey, MD. It was created on her behalf by Arlyce Harman, a trained medical scribe. The creation of this record is based on the scribe's personal observations and the provider's statements to them. This document has been checked and approved by the attending provider.  I have reviewed the above documentation for accuracy and completeness, and I agree with the above.  This note was electronically signed. Molli Hazard, MD  01/13/2016

## 2016-01-15 ENCOUNTER — Encounter (HOSPITAL_COMMUNITY): Payer: BLUE CROSS/BLUE SHIELD

## 2016-01-21 ENCOUNTER — Other Ambulatory Visit (HOSPITAL_COMMUNITY): Payer: Self-pay

## 2016-01-21 DIAGNOSIS — C801 Malignant (primary) neoplasm, unspecified: Secondary | ICD-10-CM

## 2016-01-21 MED ORDER — LORAZEPAM 0.5 MG PO TABS: 0.5000 mg | ORAL_TABLET | Freq: Three times a day (TID) | ORAL | 1 refills | Status: DC

## 2016-02-05 MED ORDER — FULVESTRANT 250 MG/5ML IM SOLN
INTRAMUSCULAR | Status: AC
  Filled 2016-02-05: qty 5

## 2016-02-13 NOTE — Progress Notes (Signed)
[facility] [place], MD [address] 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 Persistent post operative elevation in CA-125 at 304.9 U/ml, normal post-op CEA  SUMMARY OF ONCOLOGIC HISTORY:   Mucinous adenocarcinoma ([facility])   06/24/2015 Imaging    CT CAP- Complex cystic central pelvic mass remains suspicious for cystic ovarian carcinoma, most likely arising from the right ovary. Prominent right gonadal vasculature. Tiny hypodensity in the dome of the liver is too small to characterize, but stable.      06/26/2015 Pathology Results    Adnexa - ovary +/- tube, neoplastic, right - MUCINOUS ADENOCARCINOMA, 13 CM. - OVARIAN SURFACE NOT INVOLVED BY TUMOR. - UNREMARKABLE FALLOPIAN TUBE WITH NO ENDOMETRIOSIS OR MALIGNANCY.      06/26/2015 Procedure    Exploratory laparotomy with total hysterectomy bilateral salpingo-oophorectomy, pelvic and para-aortic lymph node dissection, omentectomy washings and biopsies for ovarian cancer by Dr. [name]      06/27/2015 Pathology Results    PERITONEAL WASHING(SPECIMEN 1 OF 1 COLLECTED 06/27/15): ATYPICAL CELLS. Immunohistochemistry calretinin, cytokeratin 5/6, estrogen receptor, progesterone receptor and WT-1 is performed. The morphology and immunophenotype favor reactive mesothelial cells      07/10/2015 Initial Diagnosis    Mucinous adenocarcinoma ([place])      07/22/2015 -  Chemotherapy    FOLFOX      07/24/2015 Survivorship    Genetic Counseling consultation with [name].      07/26/2015 Adverse Reaction    Loose stools, 2-3 per 24 hours      07/29/2015 Treatment Plan Change    5FU bolus discontinued due to loose stools.      08/05/2015 Adverse Reaction    Reaction to Oxaliplatin      09/01/2015 Treatment Plan Change    Decrease 5FU CI by 10%      09/30/2015 Treatment Plan Change   Oxaliplatin reduced by 10%      12/02/2015 Treatment Plan Change    Treatement deferred x 1 week, thrombocytopenia at 85,000       CURRENT THERAPY: FOLFOX  INTERVAL HISTORY: Natalie Ball 65 y.o. female returns for follow-up of mucinous adenocarcinoma. She completed 11/12 planned cycles of FOLFOX secondary to prolonged cytopenias and neuropathy in spite of dose reductions and delays in therapy. She presents today to review recent CT scans.   Ms. Vellucci is unaccompanied. I personally reviewed and went over imaging results with the patient.   She has been stressed out and tired. She is afraid of checking the mailbox because there are so many bills. She now only checks her mail once a week to avoid the stress. She was raised to pay her bills and becomes overwhelmed when she is unable to. She finds it hard to enjoy life when all of her money is going to bills, "But I'll be alright". She will speak with Angie to see if we can help.  Her taste has returned so food tastes good and drink tastes good. She is able to have ice in drinks. She has been eating better. She reports weight gain.  Her feet have been terrible. She enjoys driving straight drive but occasionally her foot will fall off the clutch. It is worse at sometimes more than others. "My feet feel like boards". She also experiences difficulty with her hands. Sometimes when she goes to pick up a pen it is difficult. States, "It  feels like it is cold inside", but other says her hands are not cold. It has been harder for her to button buttons.  She is requesting a refill on her Ativan which helps her sleep.  She continues to stay active and mows her own yard.  She is getting back into her old routine.   MEDICAL HISTORY: Past Medical History:  Diagnosis Date  . Allergy   . Complication of anesthesia   . Family history of colon cancer   . Family history of ovarian cancer   . Family history of pancreatic cancer   . GERD  (gastroesophageal reflux disease)   . Headache   . History of bronchitis   . Mucinous adenocarcinoma ([place]) 07/10/2015  . Pneumonia   . PONV (postoperative nausea and vomiting)   . Tinnitus   . Urinary urgency   . Vertigo   . Wears glasses     has Mucinous adenocarcinoma ([facility]); Family history of ovarian cancer; Family history of pancreatic cancer; Family history of colon cancer; Genetic testing; and Nausea without vomiting on her problem list.     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  _0 @  SURGICAL HISTORY: Past Surgical History:  Procedure Laterality Date  . ABDOMINAL HYSTERECTOMY N/A 06/26/2015   Procedure: TOTAL HYSTERECTOMY ABDOMINAL;  Surgeon: [name], MD;  Location: [facility];  Service: Gynecology;  Laterality: N/A;  . APPENDECTOMY    . DEBULKING N/A 06/26/2015   Procedure: bilateral periaortic lymph node dissection biopsies and peritoneal washings;  Surgeon: [name], MD;  Location: [facility];  Service: Gynecology;  Laterality: N/A;  . DIAGNOSTIC LAPAROSCOPY  1977  . LAPAROTOMY N/A 06/26/2015   Procedure: EXPLORATORY LAPAROTOMY;  Surgeon: [name], MD;  Location: [facility];  Service: Gynecology;  Laterality: N/A;  . OMENTECTOMY N/A 06/26/2015   Procedure:  OMENTECTOMY;  Surgeon: [name], MD;  Location: [facility];  Service: Gynecology;  Laterality: N/A;  . PORTACATH PLACEMENT Left 07/18/2015   Procedure: INSERTION PORT-A-CATH;  Surgeon: Aviva Signs, MD;  Location: [facility];  Service: General;  Laterality: Left;  pt knows to arrive at 6:15  . rectal fusula  1980  . SALPINGOOPHORECTOMY Bilateral 06/26/2015   Procedure: SALPINGO OOPHORECTOMY;  Surgeon: [name], MD;  Location: [facility];  Service: Gynecology;  Laterality: Bilateral;  . TUBAL LIGATION  1974    SOCIAL HISTORY: Social History   Social History  . Marital status: Widowed    Spouse name: N/A  . Number of children: 1  . Years of education: N/A   Occupational History  . Not on file.   Social History  Main Topics  . Smoking status: Former Smoker    Packs/day: 1.00    Years: 19.00    Types: Cigarettes    Quit date: 06/14/1988  . Smokeless tobacco: Never Used  . Alcohol use Yes     Comment: occasionally   . Drug use: No  . Sexual activity: Not on file   Other Topics Concern  . Not on file   Social History Narrative  . No narrative on file  reports that she quit smoking about 27 years ago. Her smoking use included Cigarettes. She has a 19 pack-year smoking history. She has never used smokeless tobacco. She reports that she drinks alcohol. She reports that she does not use illicit drugs. She reports that prior to her illness, she enjoyed an intermittent beer or marguirtia. She denies being a heavy drinker of EtOH. She notes that she is religious and she associates herselft with  [place]. 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 She is widowed  FAMILY HISTORY: Family History  Problem Relation Age of Onset  . Cancer Father   . Colon polyps Father   . Ovarian cancer Maternal Aunt   . Colon cancer Maternal Aunt   . Pancreatic cancer Maternal Uncle   . Colon polyps Mother   . Colon polyps Sister   . Cancer Paternal Aunt     NOS  . Heart defect Maternal Grandmother     hole in the heart  . COPD Maternal Grandfather   . Colon cancer Paternal Grandmother     dx possibly under 29  . Mental illness Sister   . Uterine cancer Maternal Aunt     dx possibly in her 41s  . Pancreatic cancer Maternal Aunt   . Cancer Paternal Aunt     NOS  . Cancer Cousin     adrenal cancer dx in his 29s; maternal cousin  . Ovarian cancer Cousin     dx in her 23s; maternal cousin  . Diabetes Daughter   Mother is alive at the age of 31 with heart disease, hypothyroidism, and valgus malformation of her ankles bilaterally inhibiting her ability to ambulate well. Father is 55 years old and alive with HTN and a mild form of dementia.  Sister is 73 yo with GERD Sister is 40 yo  who was estranged from the patient until most recently. Sister 23 yo who has some sort of mental disorder requiring medications. She has 1 daughter who is 77 years old with DM and hypothyroidism. She has 1 granddaughter who is 25 yo who is overweight, but otherwise healthy. She is in college in [place], [place].  Review of Systems  Constitutional: Negative for fever, chills, weight loss and malaise/fatigue.  HENT: Negative for congestion, hearing loss, nosebleeds, sore throat and tinnitus.   Eyes: Negative for blurred vision, double vision, pain and discharge.  Respiratory: Negative for cough, hemoptysis, sputum production, and wheezing.   Cardiovascular: Negative for chest pain, palpitations, claudication, leg swelling and PND.  Gastrointestinal: Positive for heartburn, abdominal pain, and constipation. Negative for nausea, vomiting, diarrhea, blood in stool and melena.  Heartburn managed with medication. Constipation managed with laxatives.  Genitourinary: Negative for dysuria, urgency, frequency and hematuria.  Musculoskeletal: Negative for myalgias, joint pain and falls.  Skin: Negative for itching and rash.  Neurological: Positive for tingling. Negative for dizziness, tremors, sensory change, speech change, focal weakness, seizures, loss of consciousness, weakness and headaches.  Neuropathy in feet and hands. Endo/Heme/Allergies: Negative.  Psychiatric/Behavioral: Positive for nervous/anxious and insomnia. Negative for depression, suicidal ideas, memory loss and substance abuse.  Anxiety related to medical bills. Insomnia managed with Ativan  14 point review of systems was performed and is negative except as detailed under history of present illness and above   PHYSICAL EXAMINATION  ECOG PERFORMANCE STATUS: 0 - Asymptomatic  Vitals - 1 value per visit 06/22/4172  SYSTOLIC 081  DIASTOLIC 51  Pulse 55  Temperature 97.1  Respirations 16  Weight (lb) 127.8  Height   BMI 21.27  VISIT  REPORT     Physical Exam  Constitutional: She is oriented to person, place, and time and well-developed, well-nourished, and in no distress. Well Groomed. Able to get on exam table with assistance HENT:  Head: Normocephalic and atraumatic.  Nose: Nose normal.  Mouth/Throat: Oropharynx is clear and moist. No oropharyngeal exudate.  Eyes: Conjunctivae and EOM are normal. Pupils are equal,  round, and reactive to light. Right eye exhibits no discharge. Left eye exhibits no discharge. No scleral icterus.  Neck: Normal range of motion. Neck supple. No tracheal deviation present. No thyromegaly present.  Cardiovascular: Normal rate, regular rhythm and normal heart sounds.  Exam reveals no gallop and no friction rub.   No murmur heard. Pulmonary/Chest: Effort normal and breath sounds normal. She has no wheezes. She has no rales.  Abdominal: Soft. Bowel sounds are normal. She exhibits no distension and no mass. There is no tenderness. There is no rebound and no guarding.  Musculoskeletal: Normal range of motion. She exhibits no edema.  Lymphadenopathy:    She has no cervical adenopathy.  Neurological: She is alert and oriented to person, place, and time. She has normal reflexes. No cranial nerve deficit. Gait normal. Coordination normal.  Skin: Skin is warm and dry. No rash noted. several echymoses on upper extremites Psychiatric: Mood, memory, affect and judgment normal.  Nursing note and vitals reviewed.   LABORATORY DATA: I have reviewed the data as listed. Results for KRYSIA, ZAHRADNIK (MRN [id number]) as of 02/19/2016 14:43  Ref. Range 02/18/2016 11:33  Sodium Latest Ref Range: 135 - 145 mmol/L 139  Potassium Latest Ref Range: 3.5 - 5.1 mmol/L 3.9  Chloride Latest Ref Range: 101 - 111 mmol/L 104  CO2 Latest Ref Range: 22 - 32 mmol/L 27  BUN Latest Ref Range: 6 - 20 mg/dL 18  Creatinine Latest Ref Range: 0.44 - 1.00 mg/dL 0.65  Calcium Latest Ref Range: 8.9 - 10.3 mg/dL 9.7  EGFR (Non-African  Amer.) Latest Ref Range: >60 mL/min >60  EGFR (African American) Latest Ref Range: >60 mL/min >60  Glucose Latest Ref Range: 65 - 99 mg/dL 92  Anion gap Latest Ref Range: 5 - 15  8  Alkaline Phosphatase Latest Ref Range: 38 - 126 U/L 149 (H)  Albumin Latest Ref Range: 3.5 - 5.0 g/dL 4.2  AST Latest Ref Range: 15 - 41 U/L 58 (H)  ALT Latest Ref Range: 14 - 54 U/L 65 (H)  Total Protein Latest Ref Range: 6.5 - 8.1 g/dL 7.1  Total Bilirubin Latest Ref Range: 0.3 - 1.2 mg/dL 1.1  WBC Latest Ref Range: 4.0 - 10.5 K/uL 3.8 (L)  RBC Latest Ref Range: 3.87 - 5.11 MIL/uL 3.51 (L)  Hemoglobin Latest Ref Range: 12.0 - 15.0 g/dL 11.9 (L)  HCT Latest Ref Range: 36.0 - 46.0 % 35.3 (L)  MCV Latest Ref Range: 78.0 - 100.0 fL 100.6 (H)  MCH Latest Ref Range: 26.0 - 34.0 pg 33.9  MCHC Latest Ref Range: 30.0 - 36.0 g/dL 33.7  RDW Latest Ref Range: 11.5 - 15.5 % 13.1  Platelets Latest Ref Range: 150 - 400 K/uL 141 (L)  Neutrophils Latest Units: % 58  Lymphocytes Latest Units: % 29  Monocytes Relative Latest Units: % 10  Eosinophil Latest Units: % 3  Basophil Latest Units: % 0  NEUT# Latest Ref Range: 1.7 - 7.7 K/uL 2.2  Lymphocyte # Latest Ref Range: 0.7 - 4.0 K/uL 1.1  Monocyte # Latest Ref Range: 0.1 - 1.0 K/uL 0.4  Eosinophils Absolute Latest Ref Range: 0.0 - 0.7 K/uL 0.1  Basophils Absolute Latest Ref Range: 0.0 - 0.1 K/uL 0.0  CA 125 Latest Ref Range: 0.0 - 38.1 U/mL 12.1  CEA Latest Ref Range: 0.0 - 4.7 ng/mL 2.5    RADIOGRAPHIC STUDIES: I have personally reviewed the radiological images as listed and agreed with the findings in the report. Study Result  CLINICAL DATA:  66 year old female with history of mucinous adenocarcinoma of the ovary diagnosed in December 2016 treated with hysterectomy and bilateral salpingo-oophorectomy as well as chemotherapy. Followup study. No current complaints.  EXAM: CT ABDOMEN AND PELVIS WITH CONTRAST  TECHNIQUE: Multidetector CT imaging of the  abdomen and pelvis was performed using the standard protocol following bolus administration of intravenous contrast.  CONTRAST:  145m ISOVUE-300 IOPAMIDOL (ISOVUE-300) INJECTION 61%  COMPARISON:  Multiple priors, most recently CT of the abdomen and pelvis 06/24/2015.  FINDINGS: Lower chest:  Unremarkable.  Hepatobiliary: Mild diffuse low attenuation throughout the hepatic parenchyma, suggestive of hepatic steatosis. Liver has a slightly nodular contour, suggestive of early changes of cirrhosis. Sub cm low-attenuation lesion in segment 7 of the liver near the dome is unchanged and remains too small to characterize, but favored to represent a tiny cyst. No other suspicious cystic or solid hepatic lesions. No intra or extrahepatic biliary ductal dilatation. Gallbladder is normal in appearance.  Pancreas: No pancreatic mass. No pancreatic ductal dilatation. No pancreatic or peripancreatic fluid or inflammatory changes.  Spleen: Unremarkable.  Adrenals/Urinary Tract: Left kidney and bilateral adrenal glands are normal in appearance. Sub cm low-attenuation lesion in the lower pole of the right kidney is too small to definitively characterize, but is unchanged compared to the prior study, likely tiny cysts. No hydroureteronephrosis. Urinary bladder is normal in appearance.  Stomach/Bowel: The appearance of the stomach is normal. There is no pathologic dilatation of small bowel or colon. Status post appendectomy.  Vascular/Lymphatic: No significant atherosclerotic disease, aneurysm or dissection in the abdominal or pelvic vasculature. Surgical clips are noted in the retroperitoneum and along the pelvic sidewall, potentially related to prior lymph node dissection. Mildly enlarged right inguinal lymph node measuring 11 mm in short axis (image 81 of series 2). No other definite lymphadenopathy noted in the abdomen or pelvis on today's examination.  Reproductive: Status  post total abdominal hysterectomy and bilateral salpingo-oophorectomy. No definite soft tissue mass identified in the pelvis to suggest local recurrence of disease.  Other: There is a small amount of low-intermediate attenuation material in the low anatomic pelvis (axial image 69 of series 2, coronal image 77 of series 4 and sagittal image 74 of series 5), which may simply represent a small amount of loculated mildly proteinaceous proteinaceous ascites. The possibility of a mucinous metastatic deposit is not entirely excluded. No other definite findings to suggest metastatic disease in the abdomen or pelvis are otherwise noted. No larger volume of ascites. No pneumoperitoneum.  Musculoskeletal: There are no aggressive appearing lytic or blastic lesions noted in the visualized portions of the skeleton.  IMPRESSION: 1. Status post total abdominal hysterectomy and bilateral salpingo-oophorectomy. While there is no definite evidence to suggest local recurrence of disease or definite metastatic disease in the abdomen or pelvis, there is a small volume of low to intermediate attenuation fluid in the low right hemipelvis. This is favored to represent some loculated ascites, but the possibility of a small mucinous deposit is not entirely excluded. There is also a mildly enlarged right inguinal lymph node (11 mm), which is nonspecific. No other lymphadenopathy is noted elsewhere. Close attention on followup studies is recommended to ensure the stability or resolution of these findings. 2. Sub cm low-attenuation lesions in segment 7 of the liver and in the lower pole of the right kidney are both unchanged and remain too small to characterize, but are statistically favored to represent tiny cysts. 3. Additional incidental findings, as above.   Electronically  Signed   By: Vinnie Langton M.D.   On: 02/17/2016 09:41     PATHOLOGY:      ASSESSMENT and THERAPY PLAN:  Mucinous  adenocarcinoma found on exploratory laparotomy, pathologically T1cN0M0. 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 I have recommended repeat imaging in 3 months. She has an upcoming colonoscopy next week with Dr. Britta Mccreedy in [place].   Labs drawn after our visit today. Patient is requesting to be checked for Hepatitis C. Patient is requesting a note to allow her to have her teeth cleaned. We will call her with all of her laboratory results when available.  I have refilled her Ativan for difficulty sleeping. RTC in 3 months post CT scans to discuss results, with labs.  Orders Placed This Encounter  Procedures  . CT Abdomen Pelvis W Contrast    Standing Status:   Future    Standing Expiration Date:   02/17/2017    Order Specific Question:   If indicated for the ordered procedure, I authorize the administration of contrast media per Radiology protocol    Answer:   Yes    Order Specific Question:   Reason for Exam (SYMPTOM  OR DIAGNOSIS REQUIRED)    Answer:   restaging mucinous adenocarcinoma    Order Specific Question:   Preferred imaging location?    Answer:   [facility]  . CBC with Differential    Standing Status:   Future    Standing Expiration Date:   02/17/2017  . CA 125    Standing Status:   Future    Standing Expiration Date:   02/17/2017  . CEA    Standing Status:   Future    Standing Expiration Date:   02/17/2017  . Hepatitis panel, acute    Standing Status:   Future    Number of Occurrences:   1    Standing Expiration Date:   02/17/2017  . CBC with Differential    Standing Status:   Future    Standing Expiration Date:   02/17/2017  . Comprehensive metabolic panel    Standing Status:   Future    Standing Expiration Date:   02/17/2017  .  CA 125    Standing Status:   Future    Standing Expiration Date:   02/17/2017  . CEA    Standing Status:   Future    Standing Expiration Date:   02/17/2017    All questions were answered. The patient knows to call the clinic with any problems, questions or concerns. We can certainly see the patient much sooner if necessary. . This document serves as a record of services personally performed by Ancil Linsey, MD. It was created on her behalf by Arlyce Harman, a trained medical scribe. The creation of this record is based on the scribe's personal observations and the provider's statements to them. This document has been checked and approved by the attending provider.  I have reviewed the above documentation for accuracy and completeness, and I agree with the above.  This note was electronically signed. Molli Hazard, MD  02/13/2016

## 2016-02-17 ENCOUNTER — Ambulatory Visit (HOSPITAL_COMMUNITY)
Admission: RE | Admit: 2016-02-17 | Discharge: 2016-02-17 | Disposition: A | Discharge status: Home / Self Care | Payer: BLUE CROSS/BLUE SHIELD | Source: Ambulatory Visit | Attending: Hematology & Oncology | Admitting: Hematology & Oncology

## 2016-02-17 DIAGNOSIS — Z90710 Acquired absence of both cervix and uterus: Secondary | ICD-10-CM | POA: Insufficient documentation

## 2016-02-17 DIAGNOSIS — R590 Localized enlarged lymph nodes: Secondary | ICD-10-CM | POA: Diagnosis not present

## 2016-02-17 DIAGNOSIS — C801 Malignant (primary) neoplasm, unspecified: Secondary | ICD-10-CM | POA: Insufficient documentation

## 2016-02-17 DIAGNOSIS — K769 Liver disease, unspecified: Secondary | ICD-10-CM | POA: Diagnosis not present

## 2016-02-17 DIAGNOSIS — N289 Disorder of kidney and ureter, unspecified: Secondary | ICD-10-CM | POA: Insufficient documentation

## 2016-02-17 MED ORDER — IOPAMIDOL (ISOVUE-300) INJECTION 61%
100.0000 mL | Freq: Once | INTRAVENOUS | Status: AC | PRN
  Administered 2016-02-17: 100 mL via INTRAVENOUS

## 2016-02-18 ENCOUNTER — Other Ambulatory Visit (HOSPITAL_COMMUNITY): Payer: BLUE CROSS/BLUE SHIELD

## 2016-02-18 ENCOUNTER — Encounter (HOSPITAL_BASED_OUTPATIENT_CLINIC_OR_DEPARTMENT_OTHER): Payer: BLUE CROSS/BLUE SHIELD

## 2016-02-18 ENCOUNTER — Encounter (HOSPITAL_COMMUNITY): Payer: Self-pay | Admitting: Hematology & Oncology

## 2016-02-18 ENCOUNTER — Encounter (HOSPITAL_COMMUNITY): Payer: BLUE CROSS/BLUE SHIELD | Attending: Hematology & Oncology | Admitting: Hematology & Oncology

## 2016-02-18 VITALS — BP 143/51 | HR 55 | Temp 97.1°F | Resp 16 | Wt 127.8 lb

## 2016-02-18 DIAGNOSIS — G620 Drug-induced polyneuropathy: Secondary | ICD-10-CM | POA: Diagnosis not present

## 2016-02-18 DIAGNOSIS — C801 Malignant (primary) neoplasm, unspecified: Secondary | ICD-10-CM

## 2016-02-18 DIAGNOSIS — Z95828 Presence of other vascular implants and grafts: Secondary | ICD-10-CM

## 2016-02-18 DIAGNOSIS — D6959 Other secondary thrombocytopenia: Secondary | ICD-10-CM

## 2016-02-18 LAB — COMPREHENSIVE METABOLIC PANEL
ALT: 65 U/L — AB (ref 14–54)
AST: 58 U/L — ABNORMAL HIGH (ref 15–41)
Albumin: 4.2 g/dL (ref 3.5–5.0)
Alkaline Phosphatase: 149 U/L — ABNORMAL HIGH (ref 38–126)
Anion gap: 8 (ref 5–15)
BUN: 18 mg/dL (ref 6–20)
CHLORIDE: 104 mmol/L (ref 101–111)
CO2: 27 mmol/L (ref 22–32)
Calcium: 9.7 mg/dL (ref 8.9–10.3)
Creatinine, Ser: 0.65 mg/dL (ref 0.44–1.00)
Glucose, Bld: 92 mg/dL (ref 65–99)
POTASSIUM: 3.9 mmol/L (ref 3.5–5.1)
SODIUM: 139 mmol/L (ref 135–145)
Total Bilirubin: 1.1 mg/dL (ref 0.3–1.2)
Total Protein: 7.1 g/dL (ref 6.5–8.1)

## 2016-02-18 LAB — CBC WITH DIFFERENTIAL/PLATELET
BASOS ABS: 0 10*3/uL (ref 0.0–0.1)
Basophils Relative: 0 %
EOS ABS: 0.1 10*3/uL (ref 0.0–0.7)
EOS PCT: 3 %
HCT: 35.3 % — ABNORMAL LOW (ref 36.0–46.0)
Hemoglobin: 11.9 g/dL — ABNORMAL LOW (ref 12.0–15.0)
LYMPHS ABS: 1.1 10*3/uL (ref 0.7–4.0)
LYMPHS PCT: 29 %
MCH: 33.9 pg (ref 26.0–34.0)
MCHC: 33.7 g/dL (ref 30.0–36.0)
MCV: 100.6 fL — AB (ref 78.0–100.0)
MONO ABS: 0.4 10*3/uL (ref 0.1–1.0)
Monocytes Relative: 10 %
Neutro Abs: 2.2 10*3/uL (ref 1.7–7.7)
Neutrophils Relative %: 58 %
PLATELETS: 141 10*3/uL — AB (ref 150–400)
RBC: 3.51 MIL/uL — AB (ref 3.87–5.11)
RDW: 13.1 % (ref 11.5–15.5)
WBC: 3.8 10*3/uL — AB (ref 4.0–10.5)

## 2016-02-18 MED ORDER — SODIUM CHLORIDE 0.9% FLUSH
20.0000 mL | INTRAVENOUS | Status: DC | PRN
Start: 2016-02-18 — End: 2016-02-18
  Administered 2016-02-18: 20 mL via INTRAVENOUS
  Filled 2016-02-18: qty 20

## 2016-02-18 MED ORDER — HEPARIN SOD (PORK) LOCK FLUSH 100 UNIT/ML IV SOLN
500.0000 [IU] | Freq: Once | INTRAVENOUS | Status: AC
  Administered 2016-02-18: 500 [IU] via INTRAVENOUS

## 2016-02-18 MED ORDER — LORAZEPAM 0.5 MG PO TABS: 0.5000 mg | ORAL_TABLET | Freq: Three times a day (TID) | ORAL | 1 refills | Status: DC

## 2016-02-18 MED ORDER — HEPARIN SOD (PORK) LOCK FLUSH 100 UNIT/ML IV SOLN
INTRAVENOUS | Status: AC
  Filled 2016-02-18: qty 5

## 2016-02-18 NOTE — Patient Instructions (Signed)
[place] at [facility] Discharge Instructions  RECOMMENDATIONS MADE BY THE CONSULTANT AND ANY TEST RESULTS WILL BE SENT TO YOUR REFERRING PHYSICIAN.  You saw Dr. Whitney Muse today. Labs today. CT in 3 months Follow up in 3 months after CT with lab work.  Thank you for choosing [place] at [facility] to provide your oncology and hematology care.  To afford each patient quality time with our provider, please arrive at least 15 minutes before your scheduled appointment time.   Beginning January 23rd 2017 lab work for the [facility] will be done in the  Main lab at [facility] on 1st floor. If you have a lab appointment with the [facility] please come in thru the  Main Entrance and check in at the main information desk  You need to re-schedule your appointment should you arrive 10 or more minutes late.  We strive to give you quality time with our providers, and arriving late affects you and other patients whose appointments are after yours.  Also, if you no show three or more times for appointments you may be dismissed from the clinic at the providers discretion.     Again, thank you for choosing [facility].  Our hope is that these requests will decrease the amount of time that you wait before being seen by our physicians.       _____________________________________________________________  Should you have questions after your visit to [facility], please contact our office at [phone] between the hours of 8:30 a.m. and 4:30 p.m.  Voicemails left after 4:30 p.m. will not be returned until the following business day.  For prescription refill requests, have your pharmacy contact our office.         Resources For Cancer Patients and their Caregivers ? American Cancer Society: Can assist with transportation, wigs, general needs, runs Look Good Feel Better.        ([phone] 789-5557 ? Cancer Care: Provides  financial assistance, online support groups, medication/co-pay assistance.  1-800-813-HOPE 364-121-5942) ? [facility] Assists [place] cancer patients and their families through emotional , educational and financial support.  817-643-7851 ? [place] DSS Where to apply for food stamps, Medicaid and utility assistance. 8584468093 ? [facility]: Transportation to medical appointments. 5635954016 ? Social Security Administration: May apply for disability if have a Stage IV cancer. 661-778-0221 (718)139-7630 ? [employer], Disability and Transit Services: Assists with nutrition, care and transit needs. [place] Support Programs: @10RELATIVEDAYS @ > Cancer Support Group  2nd Tuesday of the month 1pm-2pm, Journey Room  > Creative Journey  3rd Tuesday of the month 1130am-1pm, Journey Room  > Look Good Feel Better  1st Wednesday of the month 10am-12 noon, Journey Room (Call [place] to register [phone])

## 2016-02-18 NOTE — Progress Notes (Signed)
Natalie Ball presented for Portacath access and flush. Proper placement of portacath confirmed by CXR. Portacath located right chest wall accessed with  H 20 needle. Good blood return present.  Specimen drawn for labs.   Portacath flushed with 32ml NS and 500U/96ml Heparin and needle removed intact. Procedure without incident. Patient tolerated procedure well.

## 2016-02-18 NOTE — Patient Instructions (Signed)
[facility] at [facility] Discharge Instructions  RECOMMENDATIONS MADE BY THE CONSULTANT AND ANY TEST RESULTS WILL BE SENT TO YOUR REFERRING PHYSICIAN.  Port flush with labs today.    Thank you for choosing [facility] at [facility] to provide your oncology and hematology care.  To afford each patient quality time with our provider, please arrive at least 15 minutes before your scheduled appointment time.   Beginning January 23rd 2017 lab work for the Cancer Center will be done in the  Main lab at [place] on 1st floor. If you have a lab appointment with the Cancer Center please come in thru the  Main Entrance and check in at the main information desk  You need to re-schedule your appointment should you arrive 10 or more minutes late.  We strive to give you quality time with our providers, and arriving late affects you and other patients whose appointments are after yours.  Also, if you no show three or more times for appointments you may be dismissed from the clinic at the providers discretion.     Again, thank you for choosing [facility].  Our hope is that these requests will decrease the amount of time that you wait before being seen by our physicians.       _____________________________________________________________  Should you have questions after your visit to [facility], please contact our office at [phone] between the hours of 8:30 a.m. and 4:30 p.m.  Voicemails left after 4:30 p.m. will not be returned until the following business day.  For prescription refill requests, have your pharmacy contact our office.         Resources For Cancer Patients and their Caregivers ? American Cancer Society: Can assist with transportation, wigs, general needs, runs Look Good Feel Better.        1-888-227-6333 ? Cancer Care: Provides financial assistance, online support groups, medication/co-pay assistance.   1-800-813-HOPE (4673) ? [facility] Assists [place] cancer patients and their families through emotional , educational and financial support.  336-427-4357 ? [place] DSS Where to apply for food stamps, Medicaid and utility assistance. 336-342-1394 ? [employer]: Transportation to medical appointments. 336-347-2287 ? Social Security Administration: May apply for disability if have a Stage IV cancer. 336-342-7796 1-800-772-1213 ? [place] Aging, Disability and Transit Services: Assists with nutrition, care and transit needs. 336-349-2343  Cancer Center Support Programs: [email or web address]@ > Cancer Support Group  2nd Tuesday of the month 1pm-2pm, Journey Room  > Creative Journey  3rd Tuesday of the month 1130am-1pm, Journey Room  > Look Good Feel Better  1st Wednesday of the month 10am-12 noon, Journey Room (Call American Cancer Society to register 1-800-395-5775)    

## 2016-02-19 ENCOUNTER — Telehealth (HOSPITAL_COMMUNITY): Payer: Self-pay | Admitting: *Deleted

## 2016-02-19 LAB — HEPATITIS PANEL, ACUTE
HEP B C IGM: NEGATIVE
HEP B S AG: NEGATIVE
Hep A IgM: NEGATIVE

## 2016-02-19 LAB — CEA: CEA: 2.5 ng/mL (ref 0.0–4.7)

## 2016-02-19 LAB — CA 125: CA 125: 12.1 U/mL (ref 0.0–38.1)

## 2016-02-19 NOTE — Telephone Encounter (Signed)
-----   Message from Patrici Ranks, MD sent at 02/19/2016  2:51 PM EDT ----- Advise cancer markers are normal. Blood counts are good. She is ok to go for dental cleaning. Dr.P

## 2016-02-20 NOTE — Telephone Encounter (Signed)
-----   Message from Patrici Ranks, MD sent at 02/19/2016  2:51 PM EDT ----- Advise cancer markers are normal. Blood counts are good. She is ok to go for dental cleaning. Dr.P

## 2016-02-23 NOTE — Telephone Encounter (Signed)
-----   Message from Patrici Ranks, MD sent at 02/19/2016  2:51 PM EDT ----- Advise cancer markers are normal. Blood counts are good. She is ok to go for dental cleaning. Dr.P

## 2016-02-24 NOTE — Telephone Encounter (Signed)
Pt aware of results and aware its ok to have teeth cleaned.

## 2016-03-17 ENCOUNTER — Telehealth (HOSPITAL_COMMUNITY): Payer: Self-pay | Admitting: *Deleted

## 2016-03-17 NOTE — Telephone Encounter (Signed)
Pt aware that she can have the flu shot.

## 2016-04-06 ENCOUNTER — Encounter (HOSPITAL_COMMUNITY): Payer: Self-pay

## 2016-04-06 ENCOUNTER — Encounter (HOSPITAL_COMMUNITY): Payer: BLUE CROSS/BLUE SHIELD | Attending: Hematology & Oncology

## 2016-04-06 DIAGNOSIS — C801 Malignant (primary) neoplasm, unspecified: Secondary | ICD-10-CM | POA: Diagnosis not present

## 2016-04-06 DIAGNOSIS — Z452 Encounter for adjustment and management of vascular access device: Secondary | ICD-10-CM

## 2016-04-06 MED ORDER — HEPARIN SOD (PORK) LOCK FLUSH 100 UNIT/ML IV SOLN
500.0000 [IU] | Freq: Once | INTRAVENOUS | Status: AC
Start: 2016-04-06 — End: 2016-04-06
  Administered 2016-04-06: 500 [IU] via INTRAVENOUS

## 2016-04-06 MED ORDER — SODIUM CHLORIDE 0.9% FLUSH
10.0000 mL | INTRAVENOUS | Status: DC | PRN
  Administered 2016-04-06: 10 mL via INTRAVENOUS
  Filled 2016-04-06: qty 10

## 2016-04-06 NOTE — Patient Instructions (Signed)
[facility] at [facility] Discharge Instructions  RECOMMENDATIONS MADE BY THE CONSULTANT AND ANY TEST RESULTS WILL BE SENT TO YOUR REFERRING PHYSICIAN.  Port flush today. Return as scheduled for port flushes. Return as scheduled for office visit.   Thank you for choosing [facility] at [facility] to provide your oncology and hematology care.  To afford each patient quality time with our provider, please arrive at least 15 minutes before your scheduled appointment time.   Beginning January 23rd 2017 lab work for the Cancer Center will be done in the  Main lab at [place] on 1st floor. If you have a lab appointment with the Cancer Center please come in thru the  Main Entrance and check in at the main information desk  You need to re-schedule your appointment should you arrive 10 or more minutes late.  We strive to give you quality time with our providers, and arriving late affects you and other patients whose appointments are after yours.  Also, if you no show three or more times for appointments you may be dismissed from the clinic at the providers discretion.     Again, thank you for choosing [facility].  Our hope is that these requests will decrease the amount of time that you wait before being seen by our physicians.       _____________________________________________________________  Should you have questions after your visit to [facility], please contact our office at [phone] between the hours of 8:30 a.m. and 4:30 p.m.  Voicemails left after 4:30 p.m. will not be returned until the following business day.  For prescription refill requests, have your pharmacy contact our office.         Resources For Cancer Patients and their Caregivers ? American Cancer Society: Can assist with transportation, wigs, general needs, runs Look Good Feel Better.        1-888-227-6333 ? Cancer Care: Provides financial  assistance, online support groups, medication/co-pay assistance.  1-800-813-HOPE (4673) ? [facility] Assists [place] cancer patients and their families through emotional , educational and financial support.  336-427-4357 ? [place] DSS Where to apply for food stamps, Medicaid and utility assistance. 336-342-1394 ? [employer]: Transportation to medical appointments. 336-347-2287 ? Social Security Administration: May apply for disability if have a Stage IV cancer. 336-342-7796 1-800-772-1213 ? [place] Aging, Disability and Transit Services: Assists with nutrition, care and transit needs. 336-349-2343  Cancer Center Support Programs: @[email or web address]@ > Cancer Support Group  2nd Tuesday of the month 1pm-2pm, Journey Room  > Creative Journey  3rd Tuesday of the month 1130am-1pm, Journey Room  > Look Good Feel Better  1st Wednesday of the month 10am-12 noon, Journey Room (Call American Cancer Society to register 1-800-395-5775)   

## 2016-04-06 NOTE — Progress Notes (Signed)
Horton Marshall presented for Portacath access and flush.  Portacath located left chest wall accessed with  H 20 needle.  Good blood return present. Portacath flushed with 66ml NS and 500U/77ml Heparin and needle removed intact.  Procedure tolerated well and without incident.

## 2016-04-09 ENCOUNTER — Telehealth (HOSPITAL_COMMUNITY): Payer: Self-pay | Admitting: Oncology

## 2016-04-09 NOTE — Telephone Encounter (Signed)
RECVD A COPAY CARD REIMBURSEMENT FROM [employer] FOR [employer] IN THE AMT OF $ 869.90!!! TRIED TO PAY PTS ACCT BUT THE COVERING CASHIER COULD NOT GET THE CHARGE TO PROCESS. WILL TRY AGAIN ON 10/30 WHEN THE CASHIER RTNS FROM [facility]

## 2016-04-15 ENCOUNTER — Telehealth (HOSPITAL_COMMUNITY): Payer: Self-pay | Admitting: Hematology & Oncology

## 2016-04-15 NOTE — Telephone Encounter (Signed)
Made payment for pt  in the amt of $869.90  using [employer] 1st step copay reimbursement.

## 2016-05-17 ENCOUNTER — Ambulatory Visit (HOSPITAL_COMMUNITY)
Admission: RE | Admit: 2016-05-17 | Discharge: 2016-05-17 | Disposition: A | Discharge status: Home / Self Care | Payer: BLUE CROSS/BLUE SHIELD | Source: Ambulatory Visit | Attending: Hematology & Oncology | Admitting: Hematology & Oncology

## 2016-05-17 ENCOUNTER — Encounter (HOSPITAL_COMMUNITY): Payer: Self-pay | Admitting: Radiology

## 2016-05-17 DIAGNOSIS — C801 Malignant (primary) neoplasm, unspecified: Secondary | ICD-10-CM | POA: Diagnosis present

## 2016-05-17 LAB — POCT I-STAT CREATININE: CREATININE: 0.9 mg/dL (ref 0.44–1.00)

## 2016-05-17 MED ORDER — IOPAMIDOL (ISOVUE-300) INJECTION 61%
100.0000 mL | Freq: Once | INTRAVENOUS | Status: AC | PRN
  Administered 2016-05-17: 100 mL via INTRAVENOUS

## 2016-05-19 ENCOUNTER — Encounter (HOSPITAL_COMMUNITY): Payer: BLUE CROSS/BLUE SHIELD | Attending: Hematology & Oncology | Admitting: Hematology & Oncology

## 2016-05-19 ENCOUNTER — Encounter (HOSPITAL_COMMUNITY): Payer: Self-pay | Admitting: Hematology & Oncology

## 2016-05-19 ENCOUNTER — Encounter (HOSPITAL_BASED_OUTPATIENT_CLINIC_OR_DEPARTMENT_OTHER): Payer: BLUE CROSS/BLUE SHIELD

## 2016-05-19 VITALS — BP 124/69 | HR 70 | Temp 98.4°F | Resp 16 | Wt 127.0 lb

## 2016-05-19 DIAGNOSIS — C801 Malignant (primary) neoplasm, unspecified: Secondary | ICD-10-CM

## 2016-05-19 DIAGNOSIS — T451X5A Adverse effect of antineoplastic and immunosuppressive drugs, initial encounter: Secondary | ICD-10-CM

## 2016-05-19 DIAGNOSIS — G620 Drug-induced polyneuropathy: Secondary | ICD-10-CM | POA: Diagnosis not present

## 2016-05-19 DIAGNOSIS — Z95828 Presence of other vascular implants and grafts: Secondary | ICD-10-CM

## 2016-05-19 LAB — COMPREHENSIVE METABOLIC PANEL
ALK PHOS: 92 U/L (ref 38–126)
ALT: 40 U/L (ref 14–54)
ANION GAP: 7 (ref 5–15)
AST: 35 U/L (ref 15–41)
Albumin: 4.2 g/dL (ref 3.5–5.0)
BILIRUBIN TOTAL: 0.8 mg/dL (ref 0.3–1.2)
BUN: 16 mg/dL (ref 6–20)
CALCIUM: 9.7 mg/dL (ref 8.9–10.3)
CO2: 26 mmol/L (ref 22–32)
Chloride: 107 mmol/L (ref 101–111)
Creatinine, Ser: 0.85 mg/dL (ref 0.44–1.00)
GFR calc Af Amer: >60 mL/min (ref >60)
GLUCOSE: 97 mg/dL (ref 65–99)
POTASSIUM: 4.1 mmol/L (ref 3.5–5.1)
Sodium: 140 mmol/L (ref 135–145)
TOTAL PROTEIN: 6.7 g/dL (ref 6.5–8.1)

## 2016-05-19 MED ORDER — SODIUM CHLORIDE 0.9% FLUSH
10.0000 mL | INTRAVENOUS | Status: DC | PRN
  Administered 2016-05-19: 10 mL via INTRAVENOUS
  Filled 2016-05-19: qty 10

## 2016-05-19 MED ORDER — LORAZEPAM 0.5 MG PO TABS: 0.5000 mg | ORAL_TABLET | Freq: Three times a day (TID) | ORAL | 1 refills | Status: DC

## 2016-05-19 MED ORDER — HEPARIN SOD (PORK) LOCK FLUSH 100 UNIT/ML IV SOLN
500.0000 [IU] | Freq: Once | INTRAVENOUS | Status: AC
Start: 2016-05-19 — End: 2016-05-19
  Administered 2016-05-19: 500 [IU] via INTRAVENOUS
  Filled 2016-05-19: qty 5

## 2016-05-19 NOTE — Progress Notes (Signed)
Horton Marshall presented for Portacath access and flush.  Proper placement of portacath confirmed by CXR.  Portacath located left chest wall accessed with  H 20 needle.  Good blood return present. Portacath flushed with 42ml NS and 500U/30ml Heparin and needle removed intact.  Procedure tolerated well and without incident.

## 2016-05-19 NOTE — Patient Instructions (Signed)
[place] at [facility]  Discharge Instructions:  Port flush with lab work today Follow up as scheduled Please call the clinic if you have any questions or concerns  _______________________________________________________________  Thank you for choosing [place] at [facility] to provide your oncology and hematology care.  To afford each patient quality time with our providers, please arrive at least 15 minutes before your scheduled appointment.  You need to re-schedule your appointment if you arrive 10 or more minutes late.  We strive to give you quality time with our providers, and arriving late affects you and other patients whose appointments are after yours.  Also, if you no show three or more times for appointments you may be dismissed from the clinic.  Again, thank you for choosing [place] [facility] [place] hope is that these requests will allow you access to exceptional care and in a timely manner. _______________________________________________________________  If you have questions after your visit, please contact our office at [phone] between the hours of 8:30 a.m. and 5:00 p.m. Voicemails left after 4:30 p.m. will not be returned until the following business day. _______________________________________________________________  For prescription refill requests, have your pharmacy contact our office. _______________________________________________________________  Recommendations made by the consultant and any test results will be sent to your referring physician. _______________________________________________________________

## 2016-05-19 NOTE — Patient Instructions (Addendum)
[facility] at [facility] Discharge Instructions  RECOMMENDATIONS MADE BY THE CONSULTANT AND ANY TEST RESULTS WILL BE SENT TO YOUR REFERRING PHYSICIAN.  You saw Dr.Penland today. Follow up in 3 months with lab work. See Amy at checkout for appointments.  Thank you for choosing [facility] at [facility] to provide your oncology and hematology care.  To afford each patient quality time with our provider, please arrive at least 15 minutes before your scheduled appointment time.   Beginning January 23rd 2017 lab work for the Cancer Center will be done in the  Main lab at [place] on 1st floor. If you have a lab appointment with the Cancer Center please come in thru the  Main Entrance and check in at the main information desk  You need to re-schedule your appointment should you arrive 10 or more minutes late.  We strive to give you quality time with our providers, and arriving late affects you and other patients whose appointments are after yours.  Also, if you no show three or more times for appointments you may be dismissed from the clinic at the providers discretion.     Again, thank you for choosing [facility].  Our hope is that these requests will decrease the amount of time that you wait before being seen by our physicians.       _____________________________________________________________  Should you have questions after your visit to [facility], please contact our office at [phone] between the hours of 8:30 a.m. and 4:30 p.m.  Voicemails left after 4:30 p.m. will not be returned until the following business day.  For prescription refill requests, have your pharmacy contact our office.         Resources For Cancer Patients and their Caregivers ? American Cancer Society: Can assist with transportation, wigs, general needs, runs Look Good Feel Better.        1-888-227-6333 ? Cancer Care: Provides  financial assistance, online support groups, medication/co-pay assistance.  1-800-813-HOPE (4673) ? [facility] Assists [place] cancer patients and their families through emotional , educational and financial support.  336-427-4357 ? [place] DSS Where to apply for food stamps, Medicaid and utility assistance. 336-342-1394 ? [employer]: Transportation to medical appointments. 336-347-2287 ? Social Security Administration: May apply for disability if have a Stage IV cancer. 336-342-7796 1-800-772-1213 ? [place] Aging, Disability and Transit Services: Assists with nutrition, care and transit needs. 336-349-2343  Cancer Center Support Programs: @[email or web address]@ > Cancer Support Group  2nd Tuesday of the month 1pm-2pm, Journey Room  > Creative Journey  3rd Tuesday of the month 1130am-1pm, Journey Room  > Look Good Feel Better  1st Wednesday of the month 10am-12 noon, Journey Room (Call American Cancer Society to register 1-800-395-5775)    

## 2016-05-19 NOTE — Progress Notes (Signed)
[facility] [place], MD [address] 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 Persistent post operative elevation in CA-125 at 304.9 U/ml, normal post-op CEA  SUMMARY OF ONCOLOGIC HISTORY:   Mucinous adenocarcinoma ([place])   06/24/2015 Imaging    CT CAP- Complex cystic central pelvic mass remains suspicious for cystic ovarian carcinoma, most likely arising from the right ovary. Prominent right gonadal vasculature. Tiny hypodensity in the dome of the liver is too small to characterize, but stable.      06/26/2015 Pathology Results    Adnexa - ovary +/- tube, neoplastic, right - MUCINOUS ADENOCARCINOMA, 13 CM. - OVARIAN SURFACE NOT INVOLVED BY TUMOR. - UNREMARKABLE FALLOPIAN TUBE WITH NO ENDOMETRIOSIS OR MALIGNANCY.      06/26/2015 Procedure    Exploratory laparotomy with total hysterectomy bilateral salpingo-oophorectomy, pelvic and para-aortic lymph node dissection, omentectomy washings and biopsies for ovarian cancer by Dr. [name]      06/27/2015 Pathology Results    PERITONEAL WASHING(SPECIMEN 1 OF 1 COLLECTED 06/27/15): ATYPICAL CELLS. Immunohistochemistry calretinin, cytokeratin 5/6, estrogen receptor, progesterone receptor and WT-1 is performed. The morphology and immunophenotype favor reactive mesothelial cells      07/10/2015 Initial Diagnosis    Mucinous adenocarcinoma ([place])      07/22/2015 -  Chemotherapy    FOLFOX      07/24/2015 Survivorship    Genetic Counseling consultation with [name].      07/26/2015 Adverse Reaction    Loose stools, 2-3 per 24 hours      07/29/2015 Treatment Plan Change    5FU bolus discontinued due to loose stools.      08/05/2015 Adverse Reaction    Reaction to Oxaliplatin      09/01/2015 Treatment Plan Change    Decrease 5FU CI by 10%      09/30/2015 Treatment Plan Change   Oxaliplatin reduced by 10%      12/02/2015 Treatment Plan Change    Treatement deferred x 1 week, thrombocytopenia at 85,000      05/17/2016 Imaging    CT scans :Status post total abdominal hysterectomy and bilateral salpingo-oophorectomy, with no evidence to suggest local recurrence of disease or metastatic disease in the abdomen or pelvis. Previously noted tiny volume of ascites and mildly enlarged right inguinal lymph node have both resolved. 2. Incidental findings, as above.       CURRENT THERAPY: Observation   INTERVAL HISTORY: Natalie Ball 65 y.o. female returns for follow-up of mucinous adenocarcinoma. She completed 11/12 planned cycles of FOLFOX secondary to prolonged cytopenias and neuropathy in spite of dose reductions and delays in therapy. She presents today to review recent CT scans.   Natalie Ball is unaccompanied. I have reviewed the scans with the patient.   She says that she is doing well. She says that around the holidays it's hard because she misses her husband. She tries to stay busy, but she is very sad without him.   She says that her appetite is "wonderful". Taste has returned.   Her hands and feet are numb and tingling. She isn't dropping anything and can button her shirts. She has not fallen and notes that it does not limit her ADL's.   She has been staying active and busy at church.   She has gotten her flu shot.   Denies abdominal pain, diarrhea, or constipation. She did undergo colonoscopy with  Dr. Britta Mccreedy in [place]. No other complaints or concerns today.  MEDICAL HISTORY: Past Medical History:  Diagnosis Date  . Allergy   . Complication of anesthesia   . Family history of colon cancer   . Family history of ovarian cancer   . Family history of pancreatic cancer   . GERD (gastroesophageal reflux disease)   . Headache   . History of bronchitis   . Mucinous adenocarcinoma ([place]) 07/10/2015  . Pneumonia   . PONV (postoperative nausea and vomiting)    . Tinnitus   . Urinary urgency   . Vertigo   . Wears glasses     has Mucinous adenocarcinoma ([facility]); Family history of ovarian cancer; Family history of pancreatic cancer; Family history of colon cancer; Genetic testing; and Nausea without vomiting on her problem list.     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  @MEDADMINPROSE @  SURGICAL HISTORY: Past Surgical History:  Procedure Laterality Date  . ABDOMINAL HYSTERECTOMY N/A 06/26/2015   Procedure: TOTAL HYSTERECTOMY ABDOMINAL;  Surgeon: [name], MD;  Location: [facility];  Service: Gynecology;  Laterality: N/A;  . APPENDECTOMY    . DEBULKING N/A 06/26/2015   Procedure: bilateral periaortic lymph node dissection biopsies and peritoneal washings;  Surgeon: [name], MD;  Location: [facility];  Service: Gynecology;  Laterality: N/A;  . DIAGNOSTIC LAPAROSCOPY  1977  . LAPAROTOMY N/A 06/26/2015   Procedure: EXPLORATORY LAPAROTOMY;  Surgeon: [name], MD;  Location: [facility];  Service: Gynecology;  Laterality: N/A;  . OMENTECTOMY N/A 06/26/2015   Procedure:  OMENTECTOMY;  Surgeon: [name], MD;  Location: [facility];  Service: Gynecology;  Laterality: N/A;  . PORTACATH PLACEMENT Left 07/18/2015   Procedure: INSERTION PORT-A-CATH;  Surgeon: Aviva Signs, MD;  Location: [facility];  Service: General;  Laterality: Left;  pt knows to arrive at 6:15  . rectal fusula  1980  . SALPINGOOPHORECTOMY Bilateral 06/26/2015   Procedure: SALPINGO OOPHORECTOMY;  Surgeon: [name], MD;  Location: [facility];  Service: Gynecology;  Laterality: Bilateral;  . TUBAL LIGATION  1974    SOCIAL HISTORY: Social History   Social History  . Marital status: Widowed    Spouse name: N/A  . Number of children: 1  . Years of education: N/A   Occupational History  . Not on file.   Social History Main Topics  . Smoking status: Former Smoker    Packs/day: 1.00    Years: 19.00    Types: Cigarettes    Quit date: 06/14/1988  . Smokeless tobacco: Never Used  . Alcohol use  Yes     Comment: occasionally   . Drug use: No  . Sexual activity: Not on file   Other Topics Concern  . Not on file   Social History Narrative  . No narrative on file  reports that she quit smoking about 27 years ago. Her smoking use included Cigarettes. She has a 19 pack-year smoking history. She has never used smokeless tobacco. She reports that she drinks alcohol. She reports that she does not use illicit drugs. She reports that prior to her illness, she enjoyed an intermittent beer or marguirtia. She denies being a heavy drinker of EtOH. She notes that she is religious and she associates herselft with [place]. 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 She is widowed  FAMILY HISTORY: Family History  Problem Relation Age of Onset  . Cancer Father   . Colon polyps Father   . Ovarian cancer Maternal Aunt   .  Colon cancer Maternal Aunt   . Pancreatic cancer Maternal Uncle   . Colon polyps Mother   . Colon polyps Sister   . Cancer Paternal Aunt     NOS  . Heart defect Maternal Grandmother     hole in the heart  . COPD Maternal Grandfather   . Colon cancer Paternal Grandmother     dx possibly under 60  . Mental illness Sister   . Uterine cancer Maternal Aunt     dx possibly in her 30s  . Pancreatic cancer Maternal Aunt   . Cancer Paternal Aunt     NOS  . Cancer Cousin     adrenal cancer dx in his 53s; maternal cousin  . Ovarian cancer Cousin     dx in her 73s; maternal cousin  . Diabetes Daughter   Mother is alive at the age of 22 with heart disease, hypothyroidism, and valgus malformation of her ankles bilaterally inhibiting her ability to ambulate well. Father is 26 years old and alive with HTN and a mild form of dementia.  Sister is 47 yo with GERD Sister is 33 yo who was estranged from the patient until most recently. Sister 55 yo who has some sort of mental disorder requiring medications. She has 1 daughter who is 80 years old with DM and  hypothyroidism. She has 1 granddaughter who is 37 yo who is overweight, but otherwise healthy. She is in college in [place], [place].  Review of Systems  Constitutional: Negative.        Good appetite. Staying active.   HENT: Negative.   Eyes: Negative.   Respiratory: Negative.   Cardiovascular: Negative.   Gastrointestinal: Negative.  Negative for abdominal pain, constipation and diarrhea.  Genitourinary: Negative.   Musculoskeletal: Negative.   Skin: Negative.   Neurological: Positive for tingling (hands and feet).  Endo/Heme/Allergies: Negative.   Psychiatric/Behavioral: Positive for depression (over death of husband).  All other systems reviewed and are negative. 14 point review of systems was performed and is negative except as detailed under history of present illness and above   PHYSICAL EXAMINATION  ECOG PERFORMANCE STATUS: 0 - Asymptomatic  Vitals with BMI 05/19/2016  Height   Weight 127 lbs  BMI   Systolic 166  Diastolic 69  Pulse 70  Respirations 16    Physical Exam  Constitutional: She is oriented to person, place, and time and well-developed, well-nourished, and in no distress.  Pt was able to get on exam table without assistance.   HENT:  Head: Normocephalic and atraumatic.  Mouth/Throat: No oropharyngeal exudate.  Eyes: EOM are normal. Pupils are equal, round, and reactive to light. No scleral icterus.  Neck: Normal range of motion. Neck supple.  Cardiovascular: Normal rate, regular rhythm and normal heart sounds.   Pulmonary/Chest: Effort normal and breath sounds normal.  Abdominal: Soft. Bowel sounds are normal. She exhibits no distension and no mass. There is no tenderness. There is no rebound and no guarding.  Musculoskeletal: Normal range of motion.  Lymphadenopathy:    She has no cervical adenopathy.  Neurological: She is alert and oriented to person, place, and time. No cranial nerve deficit. Gait normal.  Skin: Skin is warm and dry.  Psychiatric:  Mood, memory, affect and judgment normal.  Nursing note and vitals reviewed.   LABORATORY DATA: I have reviewed the data as listed. Results for CARMELA, PIECHOWSKI (MRN [id number]) as of 05/19/2016 08:55  Ref. Range 05/17/2016 10:55  Creatinine Latest Ref Range: 0.44 - 1.00  mg/dL 0.90   Results for TAMERRA, MERKLEY (MRN [id number]) as of 07/12/2016 12:34  Ref. Range 05/19/2016 10:30  Sodium Latest Ref Range: 135 - 145 mmol/L 140  Potassium Latest Ref Range: 3.5 - 5.1 mmol/L 4.1  Chloride Latest Ref Range: 101 - 111 mmol/L 107  CO2 Latest Ref Range: 22 - 32 mmol/L 26  Glucose Latest Ref Range: 65 - 99 mg/dL 97  BUN Latest Ref Range: 6 - 20 mg/dL 16  Creatinine Latest Ref Range: 0.44 - 1.00 mg/dL 0.85  Calcium Latest Ref Range: 8.9 - 10.3 mg/dL 9.7  Anion gap Latest Ref Range: 5 - 15  7  Alkaline Phosphatase Latest Ref Range: 38 - 126 U/L 92  Albumin Latest Ref Range: 3.5 - 5.0 g/dL 4.2  AST Latest Ref Range: 15 - 41 U/L 35  ALT Latest Ref Range: 14 - 54 U/L 40  Total Protein Latest Ref Range: 6.5 - 8.1 g/dL 6.7  Total Bilirubin Latest Ref Range: 0.3 - 1.2 mg/dL 0.8  EGFR (African American) Latest Ref Range: >60 mL/min >60  EGFR (Non-African Amer.) Latest Ref Range: >60 mL/min >60  WBC Latest Ref Range: 4.0 - 10.5 K/uL 5.3  RBC Latest Ref Range: 3.87 - 5.11 MIL/uL 3.68 (L)  Hemoglobin Latest Ref Range: 12.0 - 15.0 g/dL 12.0  HCT Latest Ref Range: 36.0 - 46.0 % 35.9 (L)  MCV Latest Ref Range: 78.0 - 100.0 fL 97.6  MCH Latest Ref Range: 26.0 - 34.0 pg 32.6  MCHC Latest Ref Range: 30.0 - 36.0 g/dL 33.4  RDW Latest Ref Range: 11.5 - 15.5 % 13.3  Platelets Latest Ref Range: 150 - 400 K/uL 219  Neutrophils Latest Units: % 66  Lymphocytes Latest Units: % 25  Monocytes Relative Latest Units: % 8  Eosinophil Latest Units: % 1  Basophil Latest Units: % 0  NEUT# Latest Ref Range: 1.7 - 7.7 K/uL 3.5  Lymphocyte # Latest Ref Range: 0.7 - 4.0 K/uL 1.3  Monocyte # Latest Ref Range: 0.1 - 1.0 K/uL  0.4  Eosinophils Absolute Latest Ref Range: 0.0 - 0.7 K/uL 0.1  Basophils Absolute Latest Ref Range: 0.0 - 0.1 K/uL 0.0  CA 125 Latest Ref Range: 0.0 - 38.1 U/mL 12.8  CEA Latest Ref Range: 0.0 - 4.7 ng/mL 2.0   RADIOGRAPHIC STUDIES: I have personally reviewed the radiological images as listed and agreed with the findings in the report. Study Result   CLINICAL DATA:  65 year old female with history of mucinous adenocarcinoma of the ovary diagnosed in December 2016. Followup study.  EXAM: CT ABDOMEN AND PELVIS WITH CONTRAST  TECHNIQUE: Multidetector CT imaging of the abdomen and pelvis was performed using the standard protocol following bolus administration of intravenous contrast.  CONTRAST:  183m ISOVUE-300 IOPAMIDOL (ISOVUE-300) INJECTION 61%  COMPARISON:  CT the abdomen and pelvis 02/17/2016.  FINDINGS: Lower chest: Unremarkable.  Hepatobiliary: Tiny sub cm low-attenuation lesion in segment 7 of the liver is unchanged compared to the prior study, and remains too small to definitively characterize, but is favored to represent a tiny cyst. No other new focal liver abnormality is seen. No gallstones, gallbladder wall thickening, or biliary dilatation.  Pancreas: Unremarkable. No pancreatic ductal dilatation or surrounding inflammatory changes.  Spleen: Normal in size without focal abnormality.  Adrenals/Urinary Tract: Sub cm low-attenuation lesion in the lower pole the right kidney is too small to definitively characterize, but is statistically likely a tiny cyst. Left kidney and bilateral adrenal glands are normal in appearance. No hydroureteronephrosis. Urinary  bladder is normal in appearance.  Stomach/Bowel: Normal appearance of the stomach. No pathologic dilatation of small bowel or colon. A few scattered colonic diverticulae are noted, without surrounding inflammatory changes to suggest an acute diverticulitis at this time. Status  post appendectomy.  Vascular/Lymphatic: No significant atherosclerotic disease, aneurysm or dissection identified in the abdominal or pelvic vasculature. Previously noted enlarged right inguinal lymph node is now normal in size (8 mm in short axis) on image 77 of series 2. No lymphadenopathy noted in the abdomen or pelvis.  Reproductive: Status post total abdominal hysterectomy and bilateral salpingo oophorectomy. No unexpected soft tissue mass noted in the low anatomic pelvis.  Other: Previously noted tiny amount of ascites in the low anatomic pelvis has resolved. No significant volume of ascites. No pneumoperitoneum.  Musculoskeletal: There are no aggressive appearing lytic or blastic lesions noted in the visualized portions of the skeleton.  IMPRESSION: 1. Status post total abdominal hysterectomy and bilateral salpingo-oophorectomy, with no evidence to suggest local recurrence of disease or metastatic disease in the abdomen or pelvis. Previously noted tiny volume of ascites and mildly enlarged right inguinal lymph node have both resolved. 2. Incidental findings, as above.   Electronically Signed   By: Vinnie Langton M.D.   On: 05/17/2016 13:53       PATHOLOGY:      ASSESSMENT and THERAPY PLAN:  Mucinous adenocarcinoma found on exploratory laparotomy, pathologically T1cN0M0. 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 She has completed a C-scope with Dr. Britta Mccreedy. She is doing quire well.  Labs reviewed with the patient. Tumor markers are WNL.  I will refer her to Dr. Gala Romney for GI when she needs me to, Dr. Britta Mccreedy is leaving town. She said she will need a GI appointment in 3 years for her next  colonoscopy. She will let me know if she needs a referral before then.   I will write her a refill for Ativan.   She will return for a follow up in 3 months with repeat labs.   Orders Placed This Encounter  Procedures  . CBC with Differential    Standing Status:   Future    Standing Expiration Date:   05/19/2017  . Comprehensive metabolic panel    Standing Status:   Future    Standing Expiration Date:   05/19/2017  . CA 125    Standing Status:   Future    Standing Expiration Date:   05/19/2017  . CEA    Standing Status:   Future    Standing Expiration Date:   05/19/2017    All questions were answered. The patient knows to call the clinic with any problems, questions or concerns. We can certainly see the patient much sooner if necessary. . This document serves as a record of services personally performed by Ancil Linsey, MD. It was created on her behalf by Martinique Casey, a trained medical scribe. The creation of this record is based on the scribe's personal observations and the provider's statements to them. This document has been checked and approved by the attending provider.  I have reviewed the above documentation for accuracy and completeness, and I agree with the above.  This note was electronically signed. Molli Hazard, MD  07/12/2016

## 2016-05-20 LAB — CBC WITH DIFFERENTIAL/PLATELET
BASOS ABS: 0 10*3/uL (ref 0.0–0.1)
BASOS PCT: 0 %
EOS ABS: 0.1 10*3/uL (ref 0.0–0.7)
EOS PCT: 1 %
HCT: 35.9 % — ABNORMAL LOW (ref 36.0–46.0)
Hemoglobin: 12 g/dL (ref 12.0–15.0)
Lymphocytes Relative: 25 %
Lymphs Abs: 1.3 10*3/uL (ref 0.7–4.0)
MCH: 32.6 pg (ref 26.0–34.0)
MCHC: 33.4 g/dL (ref 30.0–36.0)
MCV: 97.6 fL (ref 78.0–100.0)
Monocytes Absolute: 0.4 10*3/uL (ref 0.1–1.0)
Monocytes Relative: 8 %
Neutro Abs: 3.5 10*3/uL (ref 1.7–7.7)
Neutrophils Relative %: 66 %
PLATELETS: 219 10*3/uL (ref 150–400)
RBC: 3.68 MIL/uL — AB (ref 3.87–5.11)
RDW: 13.3 % (ref 11.5–15.5)
WBC: 5.3 10*3/uL (ref 4.0–10.5)

## 2016-05-20 LAB — CA 125: CA 125: 12.8 U/mL (ref 0.0–38.1)

## 2016-05-20 LAB — CEA: CEA: 2 ng/mL (ref 0.0–4.7)

## 2016-07-12 ENCOUNTER — Encounter (HOSPITAL_COMMUNITY): Payer: Self-pay | Admitting: Hematology & Oncology

## 2016-07-13 ENCOUNTER — Encounter (HOSPITAL_COMMUNITY): Payer: Self-pay

## 2016-07-13 ENCOUNTER — Encounter (HOSPITAL_COMMUNITY): Payer: BLUE CROSS/BLUE SHIELD | Attending: Oncology

## 2016-07-13 DIAGNOSIS — C801 Malignant (primary) neoplasm, unspecified: Secondary | ICD-10-CM | POA: Diagnosis not present

## 2016-07-13 DIAGNOSIS — Z452 Encounter for adjustment and management of vascular access device: Secondary | ICD-10-CM | POA: Diagnosis not present

## 2016-07-13 MED ORDER — SODIUM CHLORIDE 0.9% FLUSH: 10.0000 mL | INTRAVENOUS | Status: DC | PRN

## 2016-07-13 MED ORDER — HEPARIN SOD (PORK) LOCK FLUSH 100 UNIT/ML IV SOLN
500.0000 [IU] | Freq: Once | INTRAVENOUS | Status: AC
  Administered 2016-07-13: 500 [IU] via INTRAVENOUS

## 2016-07-13 NOTE — Progress Notes (Signed)
Natalie Ball presented for Portacath access and flush. Portacath located left chest wall accessed with  H 20 needle.  Good blood return present. Portacath flushed with 10ml NS and 500U/5ml Heparin and needle removed intact.  Procedure tolerated well and without incident.   

## 2016-07-13 NOTE — Patient Instructions (Signed)
[facility] at [facility] Discharge Instructions  RECOMMENDATIONS MADE BY THE CONSULTANT AND ANY TEST RESULTS WILL BE SENT TO YOUR REFERRING PHYSICIAN.  Port flush today. Return as scheduled.   Thank you for choosing [facility] at [facility] to provide your oncology and hematology care.  To afford each patient quality time with our provider, please arrive at least 15 minutes before your scheduled appointment time.    If you have a lab appointment with the Cancer Center please come in thru the  Main Entrance and check in at the main information desk  You need to re-schedule your appointment should you arrive 10 or more minutes late.  We strive to give you quality time with our providers, and arriving late affects you and other patients whose appointments are after yours.  Also, if you no show three or more times for appointments you may be dismissed from the clinic at the providers discretion.     Again, thank you for choosing [facility].  Our hope is that these requests will decrease the amount of time that you wait before being seen by our physicians.       _____________________________________________________________  Should you have questions after your visit to [facility], please contact our office at [phone] between the hours of 8:30 a.m. and 4:30 p.m.  Voicemails left after 4:30 p.m. will not be returned until the following business day.  For prescription refill requests, have your pharmacy contact our office.       Resources For Cancer Patients and their Caregivers ? American Cancer Society: Can assist with transportation, wigs, general needs, runs Look Good Feel Better.        1-888-227-6333 ? Cancer Care: Provides financial assistance, online support groups, medication/co-pay assistance.  1-800-813-HOPE (4673) ? [facility] Assists [place] cancer patients and their  families through emotional , educational and financial support.  336-427-4357 ? [place] DSS Where to apply for food stamps, Medicaid and utility assistance. 336-342-1394 ? [facility]: Transportation to medical appointments. 336-347-2287 ? Social Security Administration: May apply for disability if have a Stage IV cancer. 336-342-7796 1-800-772-1213 ? [place] Aging, Disability and Transit Services: Assists with nutrition, care and transit needs. 336-349-2343  Cancer Center Support Programs: @10RELATIVEDAYS@ > Cancer Support Group  2nd Tuesday of the month 1pm-2pm, Journey Room  > Creative Journey  3rd Tuesday of the month 1130am-1pm, Journey Room  > Look Good Feel Better  1st Wednesday of the month 10am-12 noon, Journey Room (Call American Cancer Society to register 1-800-395-5775)   

## 2016-07-14 ENCOUNTER — Encounter (HOSPITAL_COMMUNITY): Payer: BLUE CROSS/BLUE SHIELD

## 2016-08-17 ENCOUNTER — Ambulatory Visit (HOSPITAL_COMMUNITY): Payer: BLUE CROSS/BLUE SHIELD | Admitting: Hematology & Oncology

## 2016-08-25 ENCOUNTER — Encounter (HOSPITAL_COMMUNITY): Payer: Medicare HMO | Attending: Oncology | Admitting: Oncology

## 2016-08-25 ENCOUNTER — Encounter (HOSPITAL_COMMUNITY): Payer: Self-pay | Admitting: Oncology

## 2016-08-25 ENCOUNTER — Encounter (HOSPITAL_BASED_OUTPATIENT_CLINIC_OR_DEPARTMENT_OTHER): Payer: Medicare HMO

## 2016-08-25 DIAGNOSIS — C801 Malignant (primary) neoplasm, unspecified: Secondary | ICD-10-CM | POA: Diagnosis present

## 2016-08-25 DIAGNOSIS — C569 Malignant neoplasm of unspecified ovary: Secondary | ICD-10-CM

## 2016-08-25 DIAGNOSIS — G620 Drug-induced polyneuropathy: Secondary | ICD-10-CM | POA: Diagnosis not present

## 2016-08-25 LAB — CBC WITH DIFFERENTIAL/PLATELET
Basophils Absolute: 0 10*3/uL (ref 0.0–0.1)
Basophils Relative: 0 %
EOS PCT: 1 %
Eosinophils Absolute: 0.1 10*3/uL (ref 0.0–0.7)
HEMATOCRIT: 34.7 % — AB (ref 36.0–46.0)
Hemoglobin: 12.1 g/dL (ref 12.0–15.0)
LYMPHS PCT: 33 %
Lymphs Abs: 1.8 10*3/uL (ref 0.7–4.0)
MCH: 33.6 pg (ref 26.0–34.0)
MCHC: 34.9 g/dL (ref 30.0–36.0)
MCV: 96.4 fL (ref 78.0–100.0)
MONO ABS: 0.4 10*3/uL (ref 0.1–1.0)
MONOS PCT: 8 %
NEUTROS ABS: 3.2 10*3/uL (ref 1.7–7.7)
Neutrophils Relative %: 58 %
Platelets: 208 10*3/uL (ref 150–400)
RBC: 3.6 MIL/uL — ABNORMAL LOW (ref 3.87–5.11)
RDW: 12.9 % (ref 11.5–15.5)
WBC: 5.5 10*3/uL (ref 4.0–10.5)

## 2016-08-25 LAB — COMPREHENSIVE METABOLIC PANEL
ALT: 34 U/L (ref 14–54)
ANION GAP: 6 (ref 5–15)
AST: 27 U/L (ref 15–41)
Albumin: 4.3 g/dL (ref 3.5–5.0)
Alkaline Phosphatase: 72 U/L (ref 38–126)
BILIRUBIN TOTAL: 0.8 mg/dL (ref 0.3–1.2)
BUN: 19 mg/dL (ref 6–20)
CALCIUM: 9.3 mg/dL (ref 8.9–10.3)
CO2: 27 mmol/L (ref 22–32)
CREATININE: 0.85 mg/dL (ref 0.44–1.00)
Chloride: 101 mmol/L (ref 101–111)
Glucose, Bld: 87 mg/dL (ref 65–99)
POTASSIUM: 3.9 mmol/L (ref 3.5–5.1)
Sodium: 134 mmol/L — ABNORMAL LOW (ref 135–145)
TOTAL PROTEIN: 7.1 g/dL (ref 6.5–8.1)

## 2016-08-25 MED ORDER — HEPARIN SOD (PORK) LOCK FLUSH 100 UNIT/ML IV SOLN
INTRAVENOUS | Status: AC
  Filled 2016-08-25: qty 5

## 2016-08-25 MED ORDER — SODIUM CHLORIDE 0.9% FLUSH
20.0000 mL | INTRAVENOUS | Status: DC | PRN
  Administered 2016-08-25: 20 mL via INTRAVENOUS
  Filled 2016-08-25: qty 20

## 2016-08-25 MED ORDER — HEPARIN SOD (PORK) LOCK FLUSH 100 UNIT/ML IV SOLN
500.0000 [IU] | Freq: Once | INTRAVENOUS | Status: AC
  Administered 2016-08-25: 500 [IU] via INTRAVENOUS

## 2016-08-25 NOTE — Patient Instructions (Signed)
[facility] at [facility] Discharge Instructions  RECOMMENDATIONS MADE BY THE CONSULTANT AND ANY TEST RESULTS WILL BE SENT TO YOUR REFERRING PHYSICIAN.  Port flush with labs  Thank you for choosing [facility] at [facility] to provide your oncology and hematology care.  To afford each patient quality time with our provider, please arrive at least 15 minutes before your scheduled appointment time.    If you have a lab appointment with the Cancer Center please come in thru the  Main Entrance and check in at the main information desk  You need to re-schedule your appointment should you arrive 10 or more minutes late.  We strive to give you quality time with our providers, and arriving late affects you and other patients whose appointments are after yours.  Also, if you no show three or more times for appointments you may be dismissed from the clinic at the providers discretion.     Again, thank you for choosing [facility].  Our hope is that these requests will decrease the amount of time that you wait before being seen by our physicians.       _____________________________________________________________  Should you have questions after your visit to [facility], please contact our office at [phone] between the hours of 8:30 a.m. and 4:30 p.m.  Voicemails left after 4:30 p.m. will not be returned until the following business day.  For prescription refill requests, have your pharmacy contact our office.       Resources For Cancer Patients and their Caregivers ? American Cancer Society: Can assist with transportation, wigs, general needs, runs Look Good Feel Better.        1-888-227-6333 ? Cancer Care: Provides financial assistance, online support groups, medication/co-pay assistance.  1-800-813-HOPE (4673) ? [facility] Assists [place] cancer patients and their families through  emotional , educational and financial support.  336-427-4357 ? [place] DSS Where to apply for food stamps, Medicaid and utility assistance. 336-342-1394 ? [facility]: Transportation to medical appointments. 336-347-2287 ? Social Security Administration: May apply for disability if have a Stage IV cancer. 336-342-7796 1-800-772-1213 ? [place] Aging, Disability and Transit Services: Assists with nutrition, care and transit needs. 336-349-2343  Cancer Center Support Programs: @10RELATIVEDAYS@ > Cancer Support Group  2nd Tuesday of the month 1pm-2pm, Journey Room  > Creative Journey  3rd Tuesday of the month 1130am-1pm, Journey Room  > Look Good Feel Better  1st Wednesday of the month 10am-12 noon, Journey Room (Call American Cancer Society to register 1-800-395-5775)    

## 2016-08-25 NOTE — Progress Notes (Signed)
Natalie Ball presented for Portacath access and flush. Proper placement of portacath confirmed by CXR. Portacath located left chest wall accessed with  H 20 needle. Good blood return present.  Specimen drawn for labs.   Portacath flushed with 2ml NS and 500U/96ml Heparin and needle removed intact. Procedure without incident. Patient tolerated procedure well.

## 2016-08-25 NOTE — Patient Instructions (Signed)
[place] at [facility] Discharge Instructions  RECOMMENDATIONS MADE BY THE CONSULTANT AND ANY TEST RESULTS WILL BE SENT TO YOUR REFERRING PHYSICIAN.  You were seen today by Dr. Evette Georges flush and lab work today Continue port flushes every 6-8 weeks Follow up in 6 months See Amy up front for appointments   Thank you for choosing [place] at [facility] to provide your oncology and hematology care.  To afford each patient quality time with our provider, please arrive at least 15 minutes before your scheduled appointment time.    If you have a lab appointment with the [facility] please come in thru the  Main Entrance and check in at the main information desk  You need to re-schedule your appointment should you arrive 10 or more minutes late.  We strive to give you quality time with our providers, and arriving late affects you and other patients whose appointments are after yours.  Also, if you no show three or more times for appointments you may be dismissed from the clinic at the providers discretion.     Again, thank you for choosing [facility].  Our hope is that these requests will decrease the amount of time that you wait before being seen by our physicians.       _____________________________________________________________  Should you have questions after your visit to [facility], please contact our office at [phone] between the hours of 8:30 a.m. and 4:30 p.m.  Voicemails left after 4:30 p.m. will not be returned until the following business day.  For prescription refill requests, have your pharmacy contact our office.       Resources For Cancer Patients and their Caregivers ? American Cancer Society: Can assist with transportation, wigs, general needs, runs Look Good Feel Better.        541-795-5155 ? Cancer Care: Provides financial assistance, online support groups,  medication/co-pay assistance.  1-800-813-HOPE 413-308-1120) ? [place] Assists [place] cancer patients and their families through emotional , educational and financial support.  7268375046 ? [place] DSS Where to apply for food stamps, Medicaid and utility assistance. 843-371-6017 ? RCATS: Transportation to medical appointments. 918-508-1431 ? Social Security Administration: May apply for disability if have a Stage IV cancer. (662) 210-5837 276-734-8961 ? [employer], Disability and Transit Services: Assists with nutrition, care and transit needs. [place] Support Programs: @10RELATIVEDAYS @ > Cancer Support Group  2nd Tuesday of the month 1pm-2pm, Journey Room  > Creative Journey  3rd Tuesday of the month 1130am-1pm, Journey Room  > Look Good Feel Better  1st Wednesday of the month 10am-12 noon, Journey Room (Call [place] to register [phone])

## 2016-08-25 NOTE — Progress Notes (Signed)
[place] at [place], [place] [address] 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 Persistent post operative elevation in CA-125 at 304.9 U/ml, normal post-op CEA  SUMMARY OF ONCOLOGIC HISTORY:   Mucinous adenocarcinoma ([place])   06/24/2015 Imaging    CT CAP- Complex cystic central pelvic mass remains suspicious for cystic ovarian carcinoma, most likely arising from the right ovary. Prominent right gonadal vasculature. Tiny hypodensity in the dome of the liver is too small to characterize, but stable.      06/26/2015 Pathology Results    Adnexa - ovary +/- tube, neoplastic, right - MUCINOUS ADENOCARCINOMA, 13 CM. - OVARIAN SURFACE NOT INVOLVED BY TUMOR. - UNREMARKABLE FALLOPIAN TUBE WITH NO ENDOMETRIOSIS OR MALIGNANCY.      06/26/2015 Procedure    Exploratory laparotomy with total hysterectomy bilateral salpingo-oophorectomy, pelvic and para-aortic lymph node dissection, omentectomy washings and biopsies for ovarian cancer by Dr. [name]      06/27/2015 Pathology Results    PERITONEAL WASHING(SPECIMEN 1 OF 1 COLLECTED 06/27/15): ATYPICAL CELLS. Immunohistochemistry calretinin, cytokeratin 5/6, estrogen receptor, progesterone receptor and WT-1 is performed. The morphology and immunophenotype favor reactive mesothelial cells      07/10/2015 Initial Diagnosis    Mucinous adenocarcinoma ([place])      07/22/2015 -  Chemotherapy    FOLFOX      07/24/2015 Survivorship    Genetic Counseling consultation with [name].      07/26/2015 Adverse Reaction    Loose stools, 2-3 per 24 hours      07/29/2015 Treatment Plan Change    5FU bolus discontinued due to loose stools.      08/05/2015 Adverse Reaction    Reaction to Oxaliplatin      09/01/2015 Treatment Plan Change    Decrease 5FU CI by 10%      09/30/2015 Treatment Plan Change   Oxaliplatin reduced by 10%      12/02/2015 Treatment Plan Change    Treatement deferred x 1 week, thrombocytopenia at 85,000      05/17/2016 Imaging    CT scans :Status post total abdominal hysterectomy and bilateral salpingo-oophorectomy, with no evidence to suggest local recurrence of disease or metastatic disease in the abdomen or pelvis. Previously noted tiny volume of ascites and mildly enlarged right inguinal lymph node have both resolved. 2. Incidental findings, as above.       CURRENT THERAPY: Observation   INTERVAL HISTORY: Natalie Ball 66 y.o. female returns for follow-up of mucinous adenocarcinoma. She completed 11/12 planned cycles of FOLFOX secondary to prolonged cytopenias and neuropathy in spite of dose reductions and delays in therapy.   She is doing well today other than her neuropathy in her hands and feet. The neuropathy has not worsened and may have improved some. Denies chest pain, SOB, abdominal pain, bloating, leg swelling, or any other concerns.   MEDICAL HISTORY: Past Medical History:  Diagnosis Date  . Allergy   . Complication of anesthesia   . Family history of colon cancer   . Family history of ovarian cancer   . Family history of pancreatic cancer   . GERD (gastroesophageal reflux disease)   . Headache   . History of bronchitis   . Mucinous adenocarcinoma ([place]) 07/10/2015  . Pneumonia   . PONV (postoperative nausea and vomiting)   . Tinnitus   . Urinary urgency   . Vertigo   .  Wears glasses     has Mucinous adenocarcinoma ([place]); Family history of ovarian cancer; Family history of pancreatic cancer; Family history of colon cancer; Genetic testing; and Nausea without vomiting on her problem list.     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  SURGICAL HISTORY: Past Surgical History:  Procedure Laterality Date  . ABDOMINAL HYSTERECTOMY N/A 06/26/2015   Procedure: TOTAL HYSTERECTOMY ABDOMINAL;  Surgeon: [name], MD;  Location: [facility];  Service:  Gynecology;  Laterality: N/A;  . APPENDECTOMY    . DEBULKING N/A 06/26/2015   Procedure: bilateral periaortic lymph node dissection biopsies and peritoneal washings;  Surgeon: [name], MD;  Location: [facility];  Service: Gynecology;  Laterality: N/A;  . DIAGNOSTIC LAPAROSCOPY  1977  . LAPAROTOMY N/A 06/26/2015   Procedure: EXPLORATORY LAPAROTOMY;  Surgeon: [name], MD;  Location: [facility];  Service: Gynecology;  Laterality: N/A;  . OMENTECTOMY N/A 06/26/2015   Procedure:  OMENTECTOMY;  Surgeon: [name], MD;  Location: [facility];  Service: Gynecology;  Laterality: N/A;  . PORTACATH PLACEMENT Left 07/18/2015   Procedure: INSERTION PORT-A-CATH;  Surgeon: Aviva Signs, MD;  Location: [facility];  Service: General;  Laterality: Left;  pt knows to arrive at 6:15  . rectal fusula  1980  . SALPINGOOPHORECTOMY Bilateral 06/26/2015   Procedure: SALPINGO OOPHORECTOMY;  Surgeon: [name], MD;  Location: [facility];  Service: Gynecology;  Laterality: Bilateral;  . TUBAL LIGATION  1974    SOCIAL HISTORY: Social History   Social History  . Marital status: Widowed    Spouse name: N/A  . Number of children: 1  . Years of education: N/A   Occupational History  . Not on file.   Social History Main Topics  . Smoking status: Former Smoker    Packs/day: 1.00    Years: 19.00    Types: Cigarettes    Quit date: 06/14/1988  . Smokeless tobacco: Never Used  . Alcohol use Yes     Comment: occasionally   . Drug use: No  . Sexual activity: Not on file   Other Topics Concern  . Not on file   Social History Narrative  . No narrative on file  reports that she quit smoking about 27 years ago. Her smoking use included Cigarettes. She has a 19 pack-year smoking history. She has never used smokeless tobacco. She reports that she drinks alcohol. She reports that she does not use illicit drugs. She reports that prior to her illness, she enjoyed an intermittent beer or marguirtia. She denies being a heavy  drinker of EtOH. She notes that she is religious and she associates herselft with [place]. 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 She is widowed  FAMILY HISTORY: Family History  Problem Relation Age of Onset  . Cancer Father   . Colon polyps Father   . Ovarian cancer Maternal Aunt   . Colon cancer Maternal Aunt   . Pancreatic cancer Maternal Uncle   . Colon polyps Mother   . Colon polyps Sister   . Cancer Paternal Aunt     NOS  . Heart defect Maternal Grandmother     hole in the heart  . COPD Maternal Grandfather   . Colon cancer Paternal Grandmother     dx possibly under 75  . Mental illness Sister   . Uterine cancer Maternal Aunt     dx possibly in her 34s  . Pancreatic cancer Maternal Aunt   . Cancer Paternal Aunt  NOS  . Cancer Cousin     adrenal cancer dx in his 64s; maternal cousin  . Ovarian cancer Cousin     dx in her 68s; maternal cousin  . Diabetes Daughter   Mother is alive at the age of 11 with heart disease, hypothyroidism, and valgus malformation of her ankles bilaterally inhibiting her ability to ambulate well. Father is 17 years old and alive with HTN and a mild form of dementia.  Sister is 38 yo with GERD Sister is 79 yo who was estranged from the patient until most recently. Sister 79 yo who has some sort of mental disorder requiring medications. She has 1 daughter who is 73 years old with DM and hypothyroidism. She has 1 granddaughter who is 52 yo who is overweight, but otherwise healthy. She is in college in [place], [place].  Review of Systems  Constitutional: Negative.   HENT: Negative.   Eyes: Negative.   Respiratory: Negative.  Negative for shortness of breath.   Cardiovascular: Negative.  Negative for chest pain and leg swelling.  Gastrointestinal: Negative.  Negative for abdominal pain.       No abdominal bloating  Genitourinary: Negative.   Musculoskeletal: Negative.   Skin: Negative.   Neurological: Positive  for tingling (hands and feet).  Endo/Heme/Allergies: Negative.   Psychiatric/Behavioral: Negative.   All other systems reviewed and are negative. 14 point review of systems was performed and is negative except as detailed under history of present illness and above  PHYSICAL EXAMINATION ECOG PERFORMANCE STATUS: 0 - Asymptomatic    Physical Exam  Constitutional: She is oriented to person, place, and time and well-developed, well-nourished, and in no distress.  HENT:  Head: Normocephalic and atraumatic.  Mouth/Throat: No oropharyngeal exudate.  Eyes: EOM are normal. Pupils are equal, round, and reactive to light. No scleral icterus.  Neck: Normal range of motion. Neck supple.  Cardiovascular: Normal rate, regular rhythm and normal heart sounds.   Pulmonary/Chest: Effort normal and breath sounds normal.  Abdominal: Soft. Bowel sounds are normal. She exhibits no distension and no mass. There is no tenderness. There is no rebound and no guarding.  Musculoskeletal: Normal range of motion.  Lymphadenopathy:    She has no cervical adenopathy.  Neurological: She is alert and oriented to person, place, and time. No cranial nerve deficit. Gait normal.  Skin: Skin is warm and dry.  Psychiatric: Mood, memory, affect and judgment normal.  Nursing note and vitals reviewed.  LABORATORY DATA: I have reviewed the data as listed. CBC Latest Ref Rng & Units 05/19/2016 02/18/2016 01/13/2016  WBC 4.0 - 10.5 K/uL 5.3 3.8(L) 10.3  Hemoglobin 12.0 - 15.0 g/dL 12.0 11.9(L) 10.8(L)  Hematocrit 36.0 - 46.0 % 35.9(L) 35.3(L) 32.3(L)  Platelets 150 - 400 K/uL 219 141(L) 77(L)   CMP Latest Ref Rng & Units 05/19/2016 05/17/2016 02/18/2016  Glucose 65 - 99 mg/dL 97 - 92  BUN 6 - 20 mg/dL 16 - 18  Creatinine 0.44 - 1.00 mg/dL 0.85 0.90 0.65  Sodium 135 - 145 mmol/L 140 - 139  Potassium 3.5 - 5.1 mmol/L 4.1 - 3.9  Chloride 101 - 111 mmol/L 107 - 104  CO2 22 - 32 mmol/L 26 - 27  Calcium 8.9 - 10.3 mg/dL 9.7 - 9.7    Total Protein 6.5 - 8.1 g/dL 6.7 - 7.1  Total Bilirubin 0.3 - 1.2 mg/dL 0.8 - 1.1  Alkaline Phos 38 - 126 U/L 92 - 149(H)  AST 15 - 41 U/L 35 - 58(H)  ALT 14 - 54 U/L 40 - 65(H)    RADIOGRAPHIC STUDIES: I have personally reviewed the radiological images as listed and agreed with the findings in the report.   CT Abdomen Pelvis w Contrast 05/17/2016 IMPRESSION: 1. Status post total abdominal hysterectomy and bilateral salpingo-oophorectomy, with no evidence to suggest local recurrence of disease or metastatic disease in the abdomen or pelvis. Previously noted tiny volume of ascites and mildly enlarged right inguinal lymph node have both resolved. 2. Incidental findings, as above.  PATHOLOGY:      ASSESSMENT and THERAPY PLAN:  Ovarian mucinous adenocarcinoma found on exploratory laparotomy, pathologically T1cN0M0. Persistent post operative elevation in CA-125 at 304.9 U/ml, normal post-op CEA. CA 125 now WNL. Oxaliplatin reaction Thrombocytopenia secondary to chemotherapy-improved Chemotherapy induced neuropathy   Clinically NED today. Tumor markers WNL.   Port flush today.   She will return for follow up in 6 months. CBC, CMP,CEA, CA 125 today and prior to next visit.   No orders of the defined types were placed in this encounter.   All questions were answered. The patient knows to call the clinic with any problems, questions or concerns. We can certainly see the patient much sooner if necessary. . This document serves as a record of services personally performed by Twana First, MD. It was created on her behalf by Martinique Casey, a trained medical scribe. The creation of this record is based on the scribe's personal observations and the provider's statements to them. This document has been checked and approved by the attending provider.  I have reviewed the above documentation for accuracy and completeness, and I agree with the above.  This note was electronically  signed. Martinique M Casey  08/25/2016

## 2016-08-26 LAB — CEA: CEA: 2.1 ng/mL (ref 0.0–4.7)

## 2016-08-26 LAB — CA 125: CA 125: 12.9 U/mL (ref 0.0–38.1)

## 2016-09-14 ENCOUNTER — Encounter (HOSPITAL_COMMUNITY): Payer: Self-pay

## 2016-10-19 ENCOUNTER — Encounter (HOSPITAL_COMMUNITY): Payer: BLUE CROSS/BLUE SHIELD

## 2016-10-19 ENCOUNTER — Encounter (HOSPITAL_COMMUNITY): Payer: Self-pay

## 2016-10-19 ENCOUNTER — Encounter (HOSPITAL_COMMUNITY): Payer: Medicare HMO | Attending: Oncology

## 2016-10-19 DIAGNOSIS — C569 Malignant neoplasm of unspecified ovary: Secondary | ICD-10-CM | POA: Diagnosis not present

## 2016-10-19 MED ORDER — HEPARIN SOD (PORK) LOCK FLUSH 100 UNIT/ML IV SOLN
500.0000 [IU] | Freq: Once | INTRAVENOUS | Status: AC
  Administered 2016-10-19: 500 [IU] via INTRAVENOUS

## 2016-10-19 MED ORDER — SODIUM CHLORIDE 0.9% FLUSH
10.0000 mL | INTRAVENOUS | Status: DC | PRN
  Administered 2016-10-19: 10 mL via INTRAVENOUS
  Filled 2016-10-19: qty 10

## 2016-10-19 NOTE — Progress Notes (Signed)
Horton Marshall presented for Portacath access and flush. Portacath located left chest wall accessed with  H 20 needle.  Good blood return present. Portacath flushed with 58ml NS and 500U/62ml Heparin and needle removed intact.  Procedure tolerated well and without incident.

## 2016-10-19 NOTE — Patient Instructions (Signed)
[facility] at [facility] Discharge Instructions  RECOMMENDATIONS MADE BY THE CONSULTANT AND ANY TEST RESULTS WILL BE SENT TO YOUR REFERRING PHYSICIAN.  Port flush today. Return as scheduled.   Thank you for choosing [facility] at [facility] to provide your oncology and hematology care.  To afford each patient quality time with our provider, please arrive at least 15 minutes before your scheduled appointment time.    If you have a lab appointment with the Cancer Center please come in thru the  Main Entrance and check in at the main information desk  You need to re-schedule your appointment should you arrive 10 or more minutes late.  We strive to give you quality time with our providers, and arriving late affects you and other patients whose appointments are after yours.  Also, if you no show three or more times for appointments you may be dismissed from the clinic at the providers discretion.     Again, thank you for choosing [facility].  Our hope is that these requests will decrease the amount of time that you wait before being seen by our physicians.       _____________________________________________________________  Should you have questions after your visit to [facility], please contact our office at [phone] between the hours of 8:30 a.m. and 4:30 p.m.  Voicemails left after 4:30 p.m. will not be returned until the following business day.  For prescription refill requests, have your pharmacy contact our office.       Resources For Cancer Patients and their Caregivers ? American Cancer Society: Can assist with transportation, wigs, general needs, runs Look Good Feel Better.        1-888-227-6333 ? Cancer Care: Provides financial assistance, online support groups, medication/co-pay assistance.  1-800-813-HOPE (4673) ? [facility] Assists [place] cancer patients and their  families through emotional , educational and financial support.  336-427-4357 ? [place] DSS Where to apply for food stamps, Medicaid and utility assistance. 336-342-1394 ? [employer]: Transportation to medical appointments. 336-347-2287 ? Social Security Administration: May apply for disability if have a Stage IV cancer. 336-342-7796 1-800-772-1213 ? [place] Aging, Disability and Transit Services: Assists with nutrition, care and transit needs. 336-349-2343  Cancer Center Support Programs: @10RELATIVEDAYS@ > Cancer Support Group  2nd Tuesday of the month 1pm-2pm, Journey Room  > Creative Journey  3rd Tuesday of the month 1130am-1pm, Journey Room  > Look Good Feel Better  1st Wednesday of the month 10am-12 noon, Journey Room (Call [employer] to register 1-800-395-5775)   

## 2016-12-14 ENCOUNTER — Encounter (HOSPITAL_COMMUNITY): Payer: BLUE CROSS/BLUE SHIELD

## 2016-12-14 ENCOUNTER — Encounter (HOSPITAL_COMMUNITY): Payer: Medicare HMO | Attending: Oncology

## 2016-12-14 ENCOUNTER — Encounter (HOSPITAL_COMMUNITY): Payer: Self-pay

## 2016-12-14 VITALS — BP 118/66 | HR 73 | Temp 98.3°F | Resp 18

## 2016-12-14 DIAGNOSIS — C569 Malignant neoplasm of unspecified ovary: Secondary | ICD-10-CM

## 2016-12-14 DIAGNOSIS — Z452 Encounter for adjustment and management of vascular access device: Secondary | ICD-10-CM | POA: Diagnosis not present

## 2016-12-14 DIAGNOSIS — Z95828 Presence of other vascular implants and grafts: Secondary | ICD-10-CM

## 2016-12-14 MED ORDER — HEPARIN SOD (PORK) LOCK FLUSH 100 UNIT/ML IV SOLN
500.0000 [IU] | Freq: Once | INTRAVENOUS | Status: AC
  Administered 2016-12-14: 500 [IU] via INTRAVENOUS

## 2016-12-14 MED ORDER — SODIUM CHLORIDE 0.9% FLUSH
10.0000 mL | INTRAVENOUS | Status: DC | PRN
  Administered 2016-12-14: 10 mL via INTRAVENOUS
  Filled 2016-12-14: qty 10

## 2016-12-14 NOTE — Patient Instructions (Signed)
[facility] at [facility] Discharge Instructions  RECOMMENDATIONS MADE BY THE CONSULTANT AND ANY TEST RESULTS WILL BE SENT TO YOUR REFERRING PHYSICIAN.  Port flush done  Follow up as scheduled.  Thank you for choosing [facility] at [facility] to provide your oncology and hematology care.  To afford each patient quality time with our provider, please arrive at least 15 minutes before your scheduled appointment time.    If you have a lab appointment with the Cancer Center please come in thru the  Main Entrance and check in at the main information desk  You need to re-schedule your appointment should you arrive 10 or more minutes late.  We strive to give you quality time with our providers, and arriving late affects you and other patients whose appointments are after yours.  Also, if you no show three or more times for appointments you may be dismissed from the clinic at the providers discretion.     Again, thank you for choosing [facility].  Our hope is that these requests will decrease the amount of time that you wait before being seen by our physicians.       _____________________________________________________________  Should you have questions after your visit to [facility], please contact our office at [phone] between the hours of 8:30 a.m. and 4:30 p.m.  Voicemails left after 4:30 p.m. will not be returned until the following business day.  For prescription refill requests, have your pharmacy contact our office.       Resources For Cancer Patients and their Caregivers ? American Cancer Society: Can assist with transportation, wigs, general needs, runs Look Good Feel Better.        1-888-227-6333 ? Cancer Care: Provides financial assistance, online support groups, medication/co-pay assistance.  1-800-813-HOPE (4673) ? [facility] Assists [place] cancer patients and their  families through emotional , educational and financial support.  336-427-4357 ? [place] DSS Where to apply for food stamps, Medicaid and utility assistance. 336-342-1394 ? [employer]: Transportation to medical appointments. 336-347-2287 ? Social Security Administration: May apply for disability if have a Stage IV cancer. 336-342-7796 1-800-772-1213 ? [place] Aging, Disability and Transit Services: Assists with nutrition, care and transit needs. 336-349-2343  Cancer Center Support Programs: @10RELATIVEDAYS@ > Cancer Support Group  2nd Tuesday of the month 1pm-2pm, Journey Room  > Creative Journey  3rd Tuesday of the month 1130am-1pm, Journey Room  > Look Good Feel Better  1st Wednesday of the month 10am-12 noon, Journey Room (Call [employer] to register 1-800-395-5775)   

## 2016-12-14 NOTE — Progress Notes (Signed)
Natalie Ball presented for Portacath access and flush. Portacath located right chest wall accessed with  H 20 needle. Good blood return present. Portacath flushed with 58ml NS and 500U/27ml Heparin and needle removed intact. Procedure without incident. Patient tolerated procedure well. Vitals stable and discharged home from clinic ambulatory. Follow up as scheduled.

## 2016-12-29 ENCOUNTER — Other Ambulatory Visit (HOSPITAL_COMMUNITY): Payer: Self-pay

## 2016-12-29 DIAGNOSIS — C801 Malignant (primary) neoplasm, unspecified: Secondary | ICD-10-CM

## 2016-12-29 MED ORDER — LORAZEPAM 0.5 MG PO TABS: 0.5000 mg | ORAL_TABLET | Freq: Three times a day (TID) | ORAL | 1 refills | Status: DC

## 2016-12-29 NOTE — Telephone Encounter (Signed)
Received refill request from patients pharmacy for Lorazepam. Reviewed with provider, chart checked and refilled.

## 2017-01-10 DIAGNOSIS — C569 Malignant neoplasm of unspecified ovary: Secondary | ICD-10-CM | POA: Diagnosis not present

## 2017-01-10 DIAGNOSIS — Z452 Encounter for adjustment and management of vascular access device: Secondary | ICD-10-CM | POA: Diagnosis not present

## 2017-02-23 ENCOUNTER — Encounter (HOSPITAL_COMMUNITY): Payer: Self-pay

## 2017-02-23 ENCOUNTER — Encounter (HOSPITAL_COMMUNITY): Payer: PPO | Attending: Oncology | Admitting: Oncology

## 2017-02-23 ENCOUNTER — Encounter (HOSPITAL_BASED_OUTPATIENT_CLINIC_OR_DEPARTMENT_OTHER): Payer: PPO

## 2017-02-23 VITALS — BP 121/54 | HR 64 | Temp 98.8°F | Resp 16 | Wt 140.7 lb

## 2017-02-23 DIAGNOSIS — C801 Malignant (primary) neoplasm, unspecified: Secondary | ICD-10-CM

## 2017-02-23 DIAGNOSIS — Z95828 Presence of other vascular implants and grafts: Secondary | ICD-10-CM

## 2017-02-23 DIAGNOSIS — D6959 Other secondary thrombocytopenia: Secondary | ICD-10-CM | POA: Insufficient documentation

## 2017-02-23 DIAGNOSIS — Z8371 Family history of colonic polyps: Secondary | ICD-10-CM | POA: Insufficient documentation

## 2017-02-23 DIAGNOSIS — Z8041 Family history of malignant neoplasm of ovary: Secondary | ICD-10-CM | POA: Insufficient documentation

## 2017-02-23 DIAGNOSIS — Z825 Family history of asthma and other chronic lower respiratory diseases: Secondary | ICD-10-CM | POA: Diagnosis not present

## 2017-02-23 DIAGNOSIS — G620 Drug-induced polyneuropathy: Secondary | ICD-10-CM | POA: Diagnosis not present

## 2017-02-23 DIAGNOSIS — C569 Malignant neoplasm of unspecified ovary: Secondary | ICD-10-CM | POA: Diagnosis not present

## 2017-02-23 DIAGNOSIS — Z90710 Acquired absence of both cervix and uterus: Secondary | ICD-10-CM | POA: Insufficient documentation

## 2017-02-23 DIAGNOSIS — Z800 Family history of malignant neoplasm of digestive organs: Secondary | ICD-10-CM | POA: Insufficient documentation

## 2017-02-23 DIAGNOSIS — K219 Gastro-esophageal reflux disease without esophagitis: Secondary | ICD-10-CM | POA: Insufficient documentation

## 2017-02-23 DIAGNOSIS — Z9889 Other specified postprocedural states: Secondary | ICD-10-CM | POA: Diagnosis not present

## 2017-02-23 DIAGNOSIS — Z808 Family history of malignant neoplasm of other organs or systems: Secondary | ICD-10-CM | POA: Diagnosis not present

## 2017-02-23 DIAGNOSIS — Z818 Family history of other mental and behavioral disorders: Secondary | ICD-10-CM | POA: Diagnosis not present

## 2017-02-23 DIAGNOSIS — D701 Agranulocytosis secondary to cancer chemotherapy: Secondary | ICD-10-CM | POA: Diagnosis not present

## 2017-02-23 DIAGNOSIS — Z833 Family history of diabetes mellitus: Secondary | ICD-10-CM | POA: Insufficient documentation

## 2017-02-23 DIAGNOSIS — Z87891 Personal history of nicotine dependence: Secondary | ICD-10-CM | POA: Diagnosis not present

## 2017-02-23 LAB — COMPREHENSIVE METABOLIC PANEL
ALBUMIN: 4.2 g/dL (ref 3.5–5.0)
ALT: 22 U/L (ref 14–54)
ANION GAP: 11 (ref 5–15)
AST: 30 U/L (ref 15–41)
Alkaline Phosphatase: 69 U/L (ref 38–126)
BUN: 16 mg/dL (ref 6–20)
CHLORIDE: 103 mmol/L (ref 101–111)
CO2: 23 mmol/L (ref 22–32)
Calcium: 9.2 mg/dL (ref 8.9–10.3)
Creatinine, Ser: 0.92 mg/dL (ref 0.44–1.00)
GFR calc Af Amer: >60 mL/min (ref >60)
GFR calc non Af Amer: >60 mL/min (ref >60)
GLUCOSE: 206 mg/dL — AB (ref 65–99)
POTASSIUM: 3.6 mmol/L (ref 3.5–5.1)
SODIUM: 137 mmol/L (ref 135–145)
Total Bilirubin: 0.7 mg/dL (ref 0.3–1.2)
Total Protein: 6.9 g/dL (ref 6.5–8.1)

## 2017-02-23 LAB — CBC WITH DIFFERENTIAL/PLATELET
BASOS ABS: 0 10*3/uL (ref 0.0–0.1)
BASOS PCT: 0 %
Eosinophils Absolute: 0.1 10*3/uL (ref 0.0–0.7)
Eosinophils Relative: 1 %
HEMATOCRIT: 35.6 % — AB (ref 36.0–46.0)
HEMOGLOBIN: 12.1 g/dL (ref 12.0–15.0)
LYMPHS PCT: 33 %
Lymphs Abs: 1.8 10*3/uL (ref 0.7–4.0)
MCH: 33.2 pg (ref 26.0–34.0)
MCHC: 34 g/dL (ref 30.0–36.0)
MCV: 97.5 fL (ref 78.0–100.0)
Monocytes Absolute: 0.2 10*3/uL (ref 0.1–1.0)
Monocytes Relative: 5 %
NEUTROS ABS: 3.2 10*3/uL (ref 1.7–7.7)
NEUTROS PCT: 61 %
PLATELETS: 212 10*3/uL (ref 150–400)
RBC: 3.65 MIL/uL — AB (ref 3.87–5.11)
RDW: 13.3 % (ref 11.5–15.5)
WBC: 5.3 10*3/uL (ref 4.0–10.5)

## 2017-02-23 MED ORDER — SODIUM CHLORIDE 0.9% FLUSH
10.0000 mL | INTRAVENOUS | Status: DC | PRN
  Administered 2017-02-23: 10 mL via INTRAVENOUS
  Filled 2017-02-23: qty 10

## 2017-02-23 MED ORDER — HEPARIN SOD (PORK) LOCK FLUSH 100 UNIT/ML IV SOLN
INTRAVENOUS | Status: AC
  Filled 2017-02-23: qty 5

## 2017-02-23 MED ORDER — HEPARIN SOD (PORK) LOCK FLUSH 100 UNIT/ML IV SOLN
500.0000 [IU] | Freq: Once | INTRAVENOUS | Status: AC
  Administered 2017-02-23: 500 [IU] via INTRAVENOUS

## 2017-02-23 NOTE — Progress Notes (Signed)
Natalie Ball presented for Portacath access and flush. Portacath located left chest wall accessed with  H 20 needle. Good blood return present. Portacath flushed with 47ml NS and 500U/49ml Heparin and needle removed intact. Procedure without incident. Patient tolerated procedure well.  Treatment given per orders. Patient tolerated it well without problems. Vitals stable and discharged home from clinic ambulatory. Follow up as scheduled.

## 2017-02-23 NOTE — Progress Notes (Signed)
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 Persistent post operative elevation in CA-125 at 304.9 U/ml, normal post-op CEA  SUMMARY OF ONCOLOGIC HISTORY:   Mucinous adenocarcinoma ([place])   06/24/2015 Imaging    CT CAP- Complex cystic central pelvic mass remains suspicious for cystic ovarian carcinoma, most likely arising from the right ovary. Prominent right gonadal vasculature. Tiny hypodensity in the dome of the liver is too small to characterize, but stable.      06/26/2015 Pathology Results    Adnexa - ovary +/- tube, neoplastic, right - MUCINOUS ADENOCARCINOMA, 13 CM. - OVARIAN SURFACE NOT INVOLVED BY TUMOR. - UNREMARKABLE FALLOPIAN TUBE WITH NO ENDOMETRIOSIS OR MALIGNANCY.      06/26/2015 Procedure    Exploratory laparotomy with total hysterectomy bilateral salpingo-oophorectomy, pelvic and para-aortic lymph node dissection, omentectomy washings and biopsies for ovarian cancer by Dr. [name]      06/27/2015 Pathology Results    PERITONEAL WASHING(SPECIMEN 1 OF 1 COLLECTED 06/27/15): ATYPICAL CELLS. Immunohistochemistry calretinin, cytokeratin 5/6, estrogen receptor, progesterone receptor and WT-1 is performed. The morphology and immunophenotype favor reactive mesothelial cells      07/10/2015 Initial Diagnosis    Mucinous adenocarcinoma ([place])      07/22/2015 -  Chemotherapy    FOLFOX      07/24/2015 Survivorship    Genetic Counseling consultation with [name].      07/26/2015 Adverse Reaction    Loose stools, 2-3 per 24 hours      07/29/2015 Treatment Plan Change    5FU bolus discontinued due to loose stools.      08/05/2015 Adverse Reaction    Reaction to Oxaliplatin      09/01/2015 Treatment Plan Change    Decrease 5FU CI by 10%      09/30/2015 Treatment Plan Change   Oxaliplatin reduced by 10%      12/02/2015 Treatment Plan Change    Treatement deferred x 1 week, thrombocytopenia at 85,000      05/17/2016 Imaging    CT scans :Status post total abdominal hysterectomy and bilateral salpingo-oophorectomy, with no evidence to suggest local recurrence of disease or metastatic disease in the abdomen or pelvis. Previously noted tiny volume of ascites and mildly enlarged right inguinal lymph node have both resolved. 2. Incidental findings, as above.       CURRENT THERAPY: Observation   INTERVAL HISTORY: Natalie Ball 66 y.o. female returns for follow-up of mucinous adenocarcinoma. She completed 11/12 planned cycles of FOLFOX secondary to prolonged cytopenias and neuropathy in spite of dose reductions and delays in therapy.   Patient presents for follow up alone. She is doing well. She continues to have a good appetite and denies any weight loss. She continues to have persistent mild in her neuropathy in her hands and feet. The neuropathy has not worsened and may have improved some. Denies chest pain, SOB, abdominal pain, bloating, leg swelling, or any other concerns.   MEDICAL HISTORY: Past Medical History:  Diagnosis Date  . Allergy   . Complication of anesthesia   . Family history of colon cancer   . Family history of ovarian cancer   . Family history of pancreatic cancer   . GERD (gastroesophageal reflux disease)   . Headache   . History of bronchitis   . Mucinous adenocarcinoma ([place]) 07/10/2015  . Pneumonia   .  PONV (postoperative nausea and vomiting)   . Tinnitus   . Urinary urgency   . Vertigo   . Wears glasses     has Mucinous adenocarcinoma ([place]); Family history of ovarian cancer; Family history of pancreatic cancer; Family history of colon cancer; Genetic testing; and Nausea without vomiting on her problem list.     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  SURGICAL HISTORY: Past Surgical History:  Procedure Laterality Date  . ABDOMINAL  HYSTERECTOMY N/A 06/26/2015   Procedure: TOTAL HYSTERECTOMY ABDOMINAL;  Surgeon: [name], MD;  Location: [facility];  Service: Gynecology;  Laterality: N/A;  . APPENDECTOMY    . DEBULKING N/A 06/26/2015   Procedure: bilateral periaortic lymph node dissection biopsies and peritoneal washings;  Surgeon: [name], MD;  Location: [facility];  Service: Gynecology;  Laterality: N/A;  . DIAGNOSTIC LAPAROSCOPY  1977  . LAPAROTOMY N/A 06/26/2015   Procedure: EXPLORATORY LAPAROTOMY;  Surgeon: [name], MD;  Location: [facility];  Service: Gynecology;  Laterality: N/A;  . OMENTECTOMY N/A 06/26/2015   Procedure:  OMENTECTOMY;  Surgeon: [name], MD;  Location: [facility];  Service: Gynecology;  Laterality: N/A;  . PORTACATH PLACEMENT Left 07/18/2015   Procedure: INSERTION PORT-A-CATH;  Surgeon: Aviva Signs, MD;  Location: [facility];  Service: General;  Laterality: Left;  pt knows to arrive at 6:15  . rectal fusula  1980  . SALPINGOOPHORECTOMY Bilateral 06/26/2015   Procedure: SALPINGO OOPHORECTOMY;  Surgeon: [name], MD;  Location: [facility];  Service: Gynecology;  Laterality: Bilateral;  . TUBAL LIGATION  1974    SOCIAL HISTORY: Social History   Social History  . Marital status: Widowed    Spouse name: N/A  . Number of children: 1  . Years of education: N/A   Occupational History  . Not on file.   Social History Main Topics  . Smoking status: Former Smoker    Packs/day: 1.00    Years: 19.00    Types: Cigarettes    Quit date: 06/14/1988  . Smokeless tobacco: Never Used  . Alcohol use Yes     Comment: occasionally   . Drug use: No  . Sexual activity: Not on file   Other Topics Concern  . Not on file   Social History Narrative  . No narrative on file  reports that she quit smoking about 27 years ago. Her smoking use included Cigarettes. She has a 19 pack-year smoking history. She has never used smokeless tobacco. She reports that she drinks alcohol. She reports that she does not  use illicit drugs. She reports that prior to her illness, she enjoyed an intermittent beer or marguirtia. She denies being a heavy drinker of EtOH. She notes that she is religious and she associates herselft with [place]. 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 She is widowed  FAMILY HISTORY: Family History  Problem Relation Age of Onset  . Cancer Father   . Colon polyps Father   . Ovarian cancer Maternal Aunt   . Colon cancer Maternal Aunt   . Pancreatic cancer Maternal Uncle   . Colon polyps Mother   . Colon polyps Sister   . Cancer Paternal Aunt        NOS  . Heart defect Maternal Grandmother        hole in the heart  . COPD Maternal Grandfather   . Colon cancer Paternal Grandmother        dx possibly under 47  . Mental illness Sister   .  Uterine cancer Maternal Aunt        dx possibly in her 71s  . Pancreatic cancer Maternal Aunt   . Cancer Paternal Aunt        NOS  . Cancer Cousin        adrenal cancer dx in his 29s; maternal cousin  . Ovarian cancer Cousin        dx in her 109s; maternal cousin  . Diabetes Daughter   Mother is alive at the age of 65 with heart disease, hypothyroidism, and valgus malformation of her ankles bilaterally inhibiting her ability to ambulate well. Father is 90 years old and alive with HTN and a mild form of dementia.  Sister is 46 yo with GERD Sister is 87 yo who was estranged from the patient until most recently. Sister 33 yo who has some sort of mental disorder requiring medications. She has 1 daughter who is 40 years old with DM and hypothyroidism. She has 1 granddaughter who is 66 yo who is overweight, but otherwise healthy. She is in college in [place], [place].  Review of Systems  Constitutional: Negative.   HENT: Negative.   Eyes: Negative.   Respiratory: Negative.  Negative for shortness of breath.   Cardiovascular: Negative.  Negative for chest pain and leg swelling.  Gastrointestinal: Negative.   Negative for abdominal pain.       No abdominal bloating  Genitourinary: Negative.   Musculoskeletal: Negative.   Skin: Negative.   Neurological: Positive for tingling (hands and feet).  Endo/Heme/Allergies: Negative.   Psychiatric/Behavioral: Negative.   All other systems reviewed and are negative. 14 point review of systems was performed and is negative except as detailed under history of present illness and above  PHYSICAL EXAMINATION ECOG PERFORMANCE STATUS: 0 - Asymptomatic    Physical Exam  Constitutional: She is oriented to person, place, and time and well-developed, well-nourished, and in no distress.  HENT:  Head: Normocephalic and atraumatic.  Mouth/Throat: No oropharyngeal exudate.  Eyes: Pupils are equal, round, and reactive to light. EOM are normal. No scleral icterus.  Neck: Normal range of motion. Neck supple.  Cardiovascular: Normal rate, regular rhythm and normal heart sounds.   Pulmonary/Chest: Effort normal and breath sounds normal.  Abdominal: Soft. Bowel sounds are normal. She exhibits no distension and no mass. There is no tenderness. There is no rebound and no guarding.  Musculoskeletal: Normal range of motion.  Lymphadenopathy:    She has no cervical adenopathy.  Neurological: She is alert and oriented to person, place, and time. No cranial nerve deficit. Gait normal.  Skin: Skin is warm and dry.  Psychiatric: Mood, memory, affect and judgment normal.  Nursing note and vitals reviewed.  LABORATORY DATA: I have reviewed the data as listed. CBC Latest Ref Rng & Units 02/23/2017 08/25/2016 05/19/2016  WBC 4.0 - 10.5 K/uL 5.3 5.5 5.3  Hemoglobin 12.0 - 15.0 g/dL 12.1 12.1 12.0  Hematocrit 36.0 - 46.0 % 35.6(L) 34.7(L) 35.9(L)  Platelets 150 - 400 K/uL 212 208 219   CMP Latest Ref Rng & Units 08/25/2016 05/19/2016 05/17/2016  Glucose 65 - 99 mg/dL 87 97 -  BUN 6 - 20 mg/dL 19 16 -  Creatinine 0.44 - 1.00 mg/dL 0.85 0.85 0.90  Sodium 135 - 145 mmol/L 134(L)  140 -  Potassium 3.5 - 5.1 mmol/L 3.9 4.1 -  Chloride 101 - 111 mmol/L 101 107 -  CO2 22 - 32 mmol/L 27 26 -  Calcium 8.9 - 10.3 mg/dL 9.3 9.7 -  Total Protein 6.5 - 8.1 g/dL 7.1 6.7 -  Total Bilirubin 0.3 - 1.2 mg/dL 0.8 0.8 -  Alkaline Phos 38 - 126 U/L 72 92 -  AST 15 - 41 U/L 27 35 -  ALT 14 - 54 U/L 34 40 -    RADIOGRAPHIC STUDIES: I have personally reviewed the radiological images as listed and agreed with the findings in the report.   CT Abdomen Pelvis w Contrast 05/17/2016 IMPRESSION: 1. Status post total abdominal hysterectomy and bilateral salpingo-oophorectomy, with no evidence to suggest local recurrence of disease or metastatic disease in the abdomen or pelvis. Previously noted tiny volume of ascites and mildly enlarged right inguinal lymph node have both resolved. 2. Incidental findings, as above.  PATHOLOGY:      ASSESSMENT and THERAPY PLAN:  Ovarian mucinous adenocarcinoma found on exploratory laparotomy, pathologically T1cN0M0. Persistent post operative elevation in CA-125 at 304.9 U/ml, normal post-op CEA. CA 125 now WNL. Oxaliplatin reaction Thrombocytopenia secondary to chemotherapy-improved Chemotherapy induced neuropathy   Clinically NED today. CA125 has been stable in the 12 range.  Reviewed labs with the patient today.  Port flush today. Since she has been disease free for over 1 year now, I will set her up to see Dr. Arnoldo Morale for chemoport removal.   She will return for follow up in 6 months. CBC, CMP, CA 125 today and prior to next visit.   Orders Placed This Encounter  Procedures  . CBC with Differential    Standing Status:   Future    Standing Expiration Date:   02/23/2018  . Comprehensive metabolic panel    Standing Status:   Future    Standing Expiration Date:   02/23/2018  . CA 125    Standing Status:   Future    Standing Expiration Date:   02/23/2018    All questions were answered. The patient knows to call the clinic with any  problems, questions or concerns. We can certainly see the patient much sooner if necessary. .  This note was electronically signed. Twana First, MD  02/23/2017

## 2017-02-23 NOTE — Patient Instructions (Addendum)
[place] at [facility] Discharge Instructions  RECOMMENDATIONS MADE BY THE CONSULTANT AND ANY TEST RESULTS WILL BE SENT TO YOUR REFERRING PHYSICIAN.  You were seen today by Dr. Twana First You had your port flushed, continue to get it flushed every 6-8 weeks. Follow up as scheduled  Thank you for choosing [place] at [facility] to provide your oncology and hematology care.  To afford each patient quality time with our provider, please arrive at least 15 minutes before your scheduled appointment time.    If you have a lab appointment with the [facility] please come in thru the  Main Entrance and check in at the main information desk  You need to re-schedule your appointment should you arrive 10 or more minutes late.  We strive to give you quality time with our providers, and arriving late affects you and other patients whose appointments are after yours.  Also, if you no show three or more times for appointments you may be dismissed from the clinic at the providers discretion.     Again, thank you for choosing [facility].  Our hope is that these requests will decrease the amount of time that you wait before being seen by our physicians.       _____________________________________________________________  Should you have questions after your visit to [facility], please contact our office at [phone] between the hours of 8:30 a.m. and 4:30 p.m.  Voicemails left after 4:30 p.m. will not be returned until the following business day.  For prescription refill requests, have your pharmacy contact our office.       Resources For Cancer Patients and their Caregivers ? American Cancer Society: Can assist with transportation, wigs, general needs, runs Look Good Feel Better.        3602420714 ? Cancer Care: Provides financial assistance, online support groups, medication/co-pay assistance.  1-800-813-HOPE  601-788-5738) ? [facility] Assists [place] cancer patients and their families through emotional , educational and financial support.  805-217-9686 ? [place] DSS Where to apply for food stamps, Medicaid and utility assistance. (402) 075-6602 ? [employer]: Transportation to medical appointments. 641-801-8606 ? Social Security Administration: May apply for disability if have a Stage IV cancer. 682-625-5226 614-677-0418 ? [employer], Disability and Transit Services: Assists with nutrition, care and transit needs. [place] Support Programs: @10RELATIVEDAYS @ > Cancer Support Group  2nd Tuesday of the month 1pm-2pm, Journey Room  > Creative Journey  3rd Tuesday of the month 1130am-1pm, Journey Room  > Look Good Feel Better  1st Wednesday of the month 10am-12 noon, Journey Room (Call Ryan to register [phone])

## 2017-02-23 NOTE — Patient Instructions (Signed)
[facility] at [facility] Discharge Instructions  RECOMMENDATIONS MADE BY THE CONSULTANT AND ANY TEST RESULTS WILL BE SENT TO YOUR REFERRING PHYSICIAN.  Port flush with labs done Follow up as scheduled.  Thank you for choosing [facility] at [facility] to provide your oncology and hematology care.  To afford each patient quality time with our provider, please arrive at least 15 minutes before your scheduled appointment time.    If you have a lab appointment with the Cancer Center please come in thru the  Main Entrance and check in at the main information desk  You need to re-schedule your appointment should you arrive 10 or more minutes late.  We strive to give you quality time with our providers, and arriving late affects you and other patients whose appointments are after yours.  Also, if you no show three or more times for appointments you may be dismissed from the clinic at the providers discretion.     Again, thank you for choosing [facility].  Our hope is that these requests will decrease the amount of time that you wait before being seen by our physicians.       _____________________________________________________________  Should you have questions after your visit to [facility], please contact our office at [phone] between the hours of 8:30 a.m. and 4:30 p.m.  Voicemails left after 4:30 p.m. will not be returned until the following business day.  For prescription refill requests, have your pharmacy contact our office.       Resources For Cancer Patients and their Caregivers ? American Cancer Society: Can assist with transportation, wigs, general needs, runs Look Good Feel Better.        1-888-227-6333 ? Cancer Care: Provides financial assistance, online support groups, medication/co-pay assistance.  1-800-813-HOPE (4673) ? [facility] Assists [place] cancer patients  and their families through emotional , educational and financial support.  336-427-4357 ? [place] DSS Where to apply for food stamps, Medicaid and utility assistance. 336-342-1394 ? RCATS: Transportation to medical appointments. 336-347-2287 ? Social Security Administration: May apply for disability if have a Stage IV cancer. 336-342-7796 1-800-772-1213 ? [place] Aging, Disability and Transit Services: Assists with nutrition, care and transit needs. 336-349-2343  Cancer Center Support Programs: @10RELATIVEDAYS@ > Cancer Support Group  2nd Tuesday of the month 1pm-2pm, Journey Room  > Creative Journey  3rd Tuesday of the month 1130am-1pm, Journey Room  > Look Good Feel Better  1st Wednesday of the month 10am-12 noon, Journey Room (Call American Cancer Society to register 1-800-395-5775)   

## 2017-02-24 LAB — CEA: CEA1: 2.2 ng/mL (ref 0.0–4.7)

## 2017-02-24 LAB — CA 125: Cancer Antigen (CA) 125: 14.1 U/mL (ref 0.0–38.1)

## 2017-02-24 IMAGING — CT CT ABD-PELV W/ CM
2 of 5 series · 15 of 46 positions shown, 17 images · IV contrast (OMNIPAQUE)
Comparison: Pelvic MRI from 06/12/2015.  CT chest from 04/07/2015.

CLINICAL DATA: Cystic ovarian mass.

EXAM:
CT CHEST, ABDOMEN, AND PELVIS WITH CONTRAST
TECHNIQUE: Multidetector CT imaging of the chest, abdomen and pelvis was
performed following the standard protocol during bolus
administration of intravenous contrast.
CONTRAST:  100mL OMNIPAQUE IOHEXOL 300 MG/ML  SOLN

[Series 2: cap with st · axial · 0.74mm/px · z∈[-625,-50]mm · 12 of 129 slices shown, 14 images]
[im 7/129  soft-tissue]
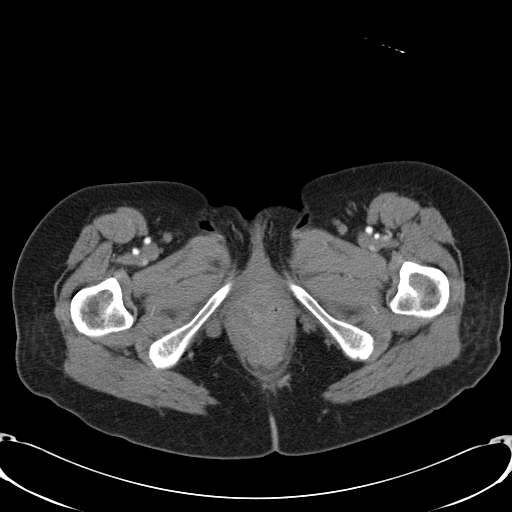
[im 7/129  bone]
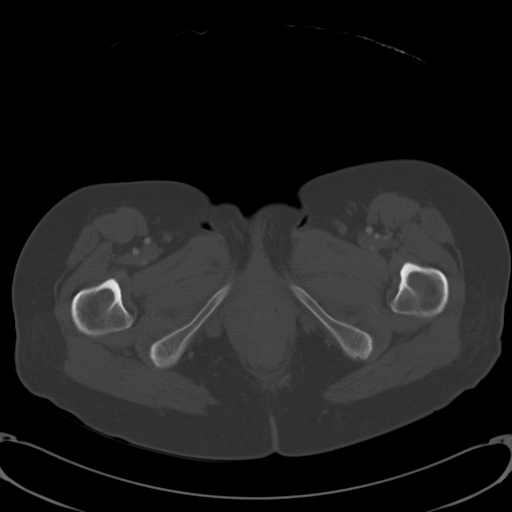
[im 21/129  soft-tissue]
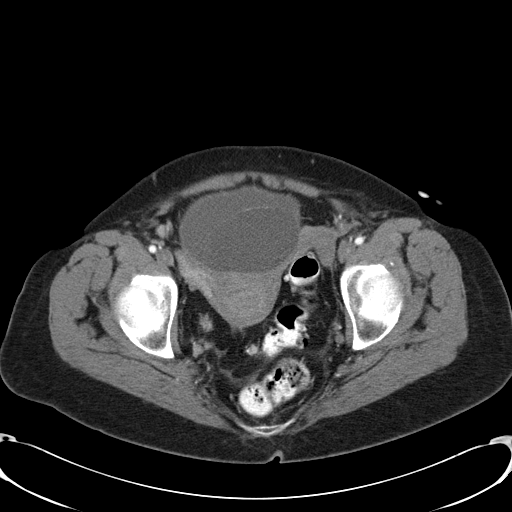
[im 27/129  soft-tissue]
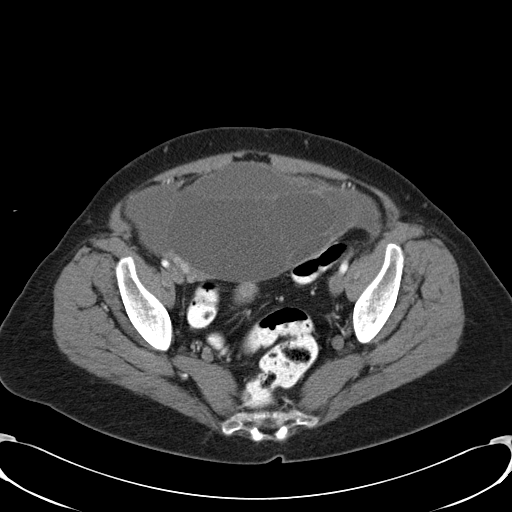
[im 41/129  soft-tissue]
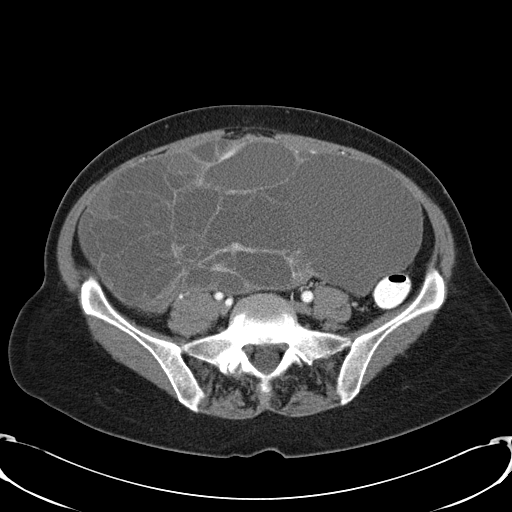
[im 48/129  soft-tissue]
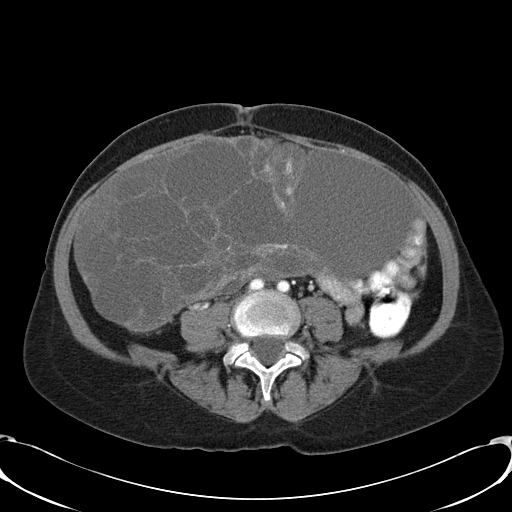
[im 61/129  soft-tissue]
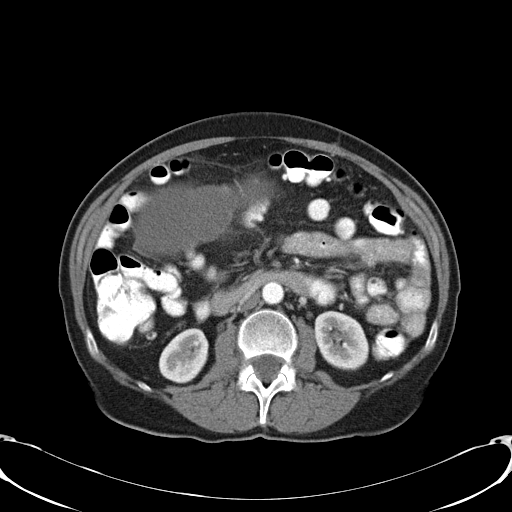
[im 68/129  soft-tissue]
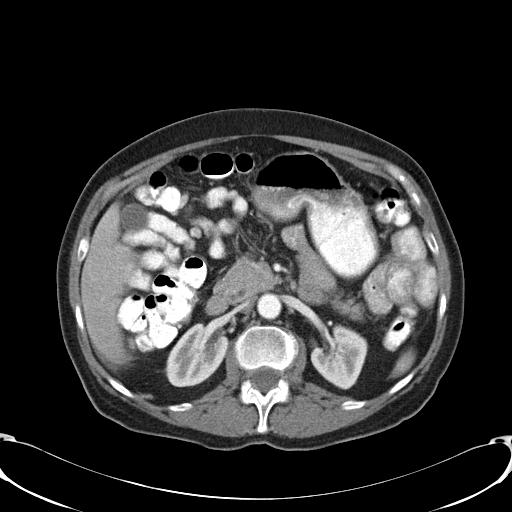
[im 81/129  soft-tissue]
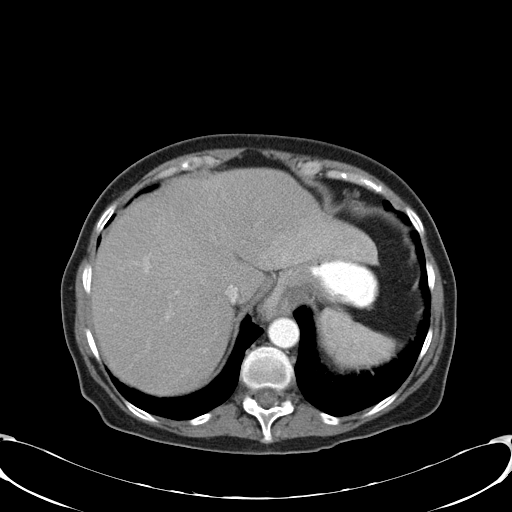
[im 88/129  soft-tissue]
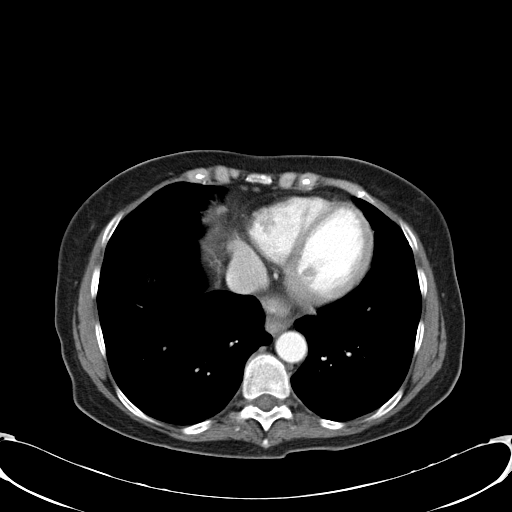
[im 88/129  bone]
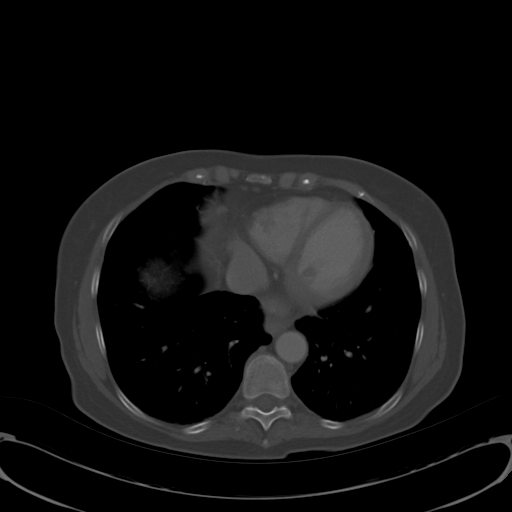
[im 102/129  soft-tissue]
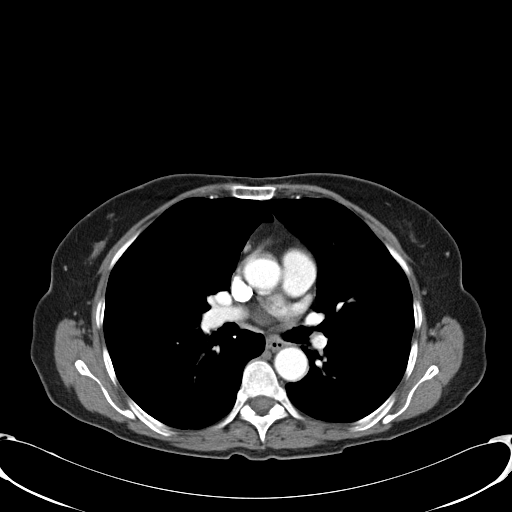
[im 108/129  soft-tissue]
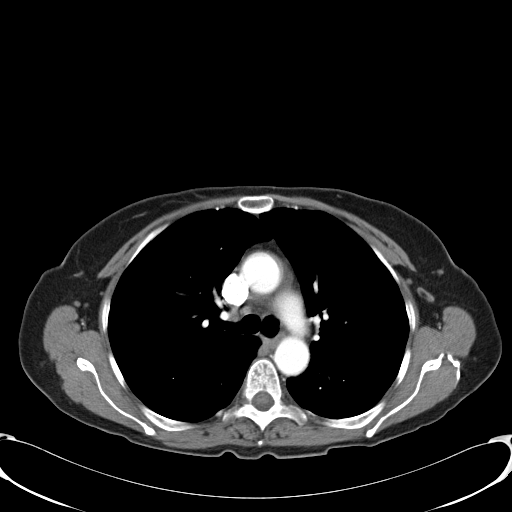
[im 122/129  soft-tissue]
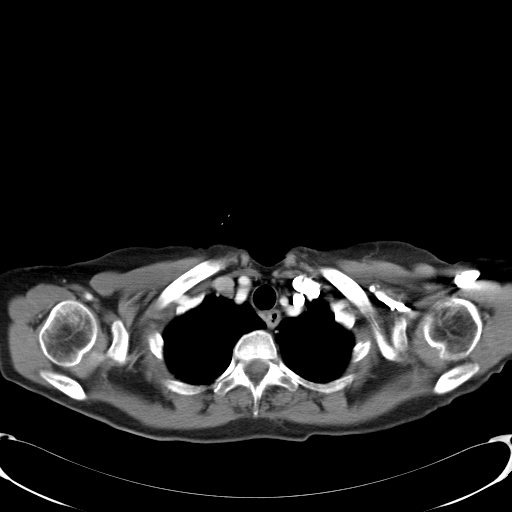

[Series 602: cor · coronal · 1.25mm/px · 3 of 84 slices shown]
[im 28/84  soft-tissue]
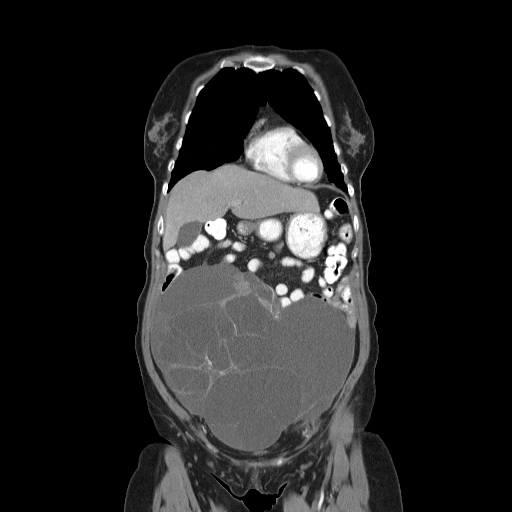
[im 37/84  soft-tissue]
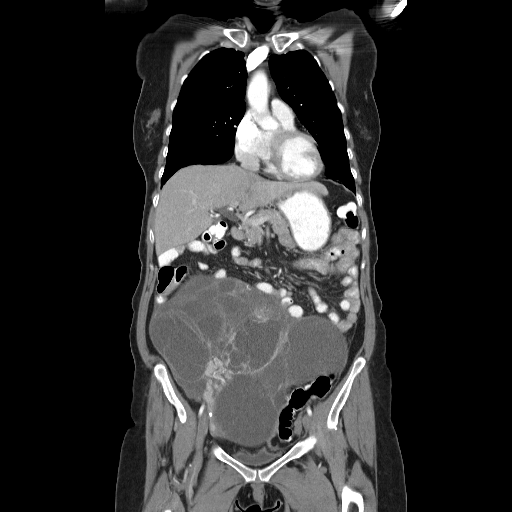
[im 47/84  soft-tissue]
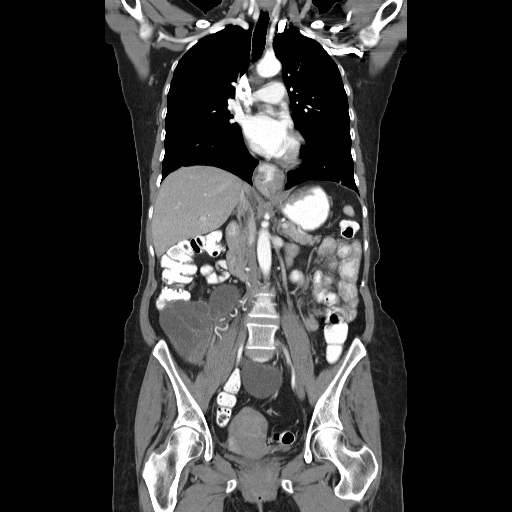

[15 of 46 positions shown; findings below may reference images not displayed]

FINDINGS: CT CHEST FINDINGS

Mediastinum/Lymph Nodes: There is no axillary lymphadenopathy. No
mediastinal lymphadenopathy. There is no hilar lymphadenopathy. The
heart size is normal. No pericardial effusion. The esophagus has
normal imaging features. Small hiatal hernia noted.

Lungs/Pleura: No focal airspace consolidation. No pulmonary edema or
pleural effusion. 4 mm left lower lobe pulmonary nodule (image 37
series 4) is stable since 04/07/2015. [DATE] mm right middle lobe nodule
on the same image is also stable.

Musculoskeletal: Bone windows reveal no worrisome lytic or sclerotic
osseous lesions.

CT ABDOMEN PELVIS FINDINGS

Hepatobiliary: 5 mm hypodensity in the dome of the liver is stable
since 04/07/2015. Geographic low attenuation of the liver parenchyma
is compatible with fatty deposition. There is no evidence for
gallstones, gallbladder wall thickening, or pericholecystic fluid.
No intrahepatic or extrahepatic biliary dilation.

Pancreas: No focal mass lesion. No dilatation of the main duct. No
intraparenchymal cyst. No peripancreatic edema.

Spleen: No splenomegaly. No focal mass lesion.

Adrenals/Urinary Tract: No adrenal nodule or mass. 7 mm low-density
lesion extreme upper pole right kidney too small to characterize but
likely a tiny cyst. Left kidney unremarkable. No evidence for
hydroureter. Bladder is decompressed.

Stomach/Bowel: Small hiatal hernia. Stomach otherwise unremarkable.
Duodenum is normally positioned as is the ligament of Treitz. No
small bowel wall thickening. No small bowel dilatation. The terminal
ileum is normal. The appendix is not visualized, but there is no
edema or inflammation in the region of the cecum. Diverticuli are
seen scattered along the entire length of the colon without CT
findings of diverticulitis.

Vascular/Lymphatic: No abdominal aortic aneurysm. No abdominal
atherosclerotic calcification. There is no gastrohepatic or
hepatoduodenal ligament lymphadenopathy. No intraperitoneal or
retroperitoneal lymphadenopy. No pelvic sidewall lymphadenopathy.

Reproductive: Uterus is unremarkable aside from displacement by the
large pelvic mass. 23.4 x 11.6 x 25.8 cm multiloculated complex
cystic lesion is identified in the anterior aspect of the central
pelvis. This mass appears to be distinct from both the uterus and
left ovary in the right gonadal vasculature tracks into the region
of this mass. Imaging features are highly suspicious for cystic
ovarian neoplasm.

Other: Trace intraperitoneal free fluid is seen in the cul-de-sac
and adjacent to the liver tip.

Musculoskeletal: Bone windows reveal no worrisome lytic or sclerotic
osseous lesions.
IMPRESSION: 1. Complex cystic central pelvic mass as seen previously. Imaging
features remain suspicious for cystic ovarian carcinoma, most likely
arising from the right ovary.
2. Prominent right gonadal vasculature.
3. Small hiatal hernia.
4. Tiny hypodensity in the dome of the liver is too small to
characterize, but is unchanged since 04/07/2015.
[DATE]. Trace intraperitoneal free fluid including adjacent to the right
liver and in the central pelvis.

## 2017-03-08 ENCOUNTER — Encounter: Payer: Self-pay | Admitting: General Surgery

## 2017-03-08 ENCOUNTER — Ambulatory Visit (INDEPENDENT_AMBULATORY_CARE_PROVIDER_SITE_OTHER): Payer: PPO | Admitting: General Surgery

## 2017-03-08 ENCOUNTER — Ambulatory Visit: Payer: Self-pay | Admitting: General Surgery

## 2017-03-08 VITALS — BP 137/77 | HR 58 | Temp 97.8°F | Resp 18 | Ht 65.0 in | Wt 143.0 lb

## 2017-03-08 DIAGNOSIS — C801 Malignant (primary) neoplasm, unspecified: Secondary | ICD-10-CM | POA: Diagnosis not present

## 2017-03-08 NOTE — Progress Notes (Signed)
Subjective:     Natalie Ball  Patient was referred by oncology for removal Port-A-Cath. In discussion with the patient, she wants to keep her Port-A-Cath until her next appointment in 6 months with oncology. Objective:    BP 137/77   Pulse (!) 58   Temp 97.8 F (36.6 C)   Resp 18   Ht 5\' 5"  (1.651 m)   Wt 143 lb (64.9 kg)   BMI 23.80 kg/m   General:  alert, cooperative and no distress       Assessment:    Mucinous adenocarcinoma, status post Port-A-Cath placement in 2017    Plan:   Patient will continue getting her Port-A-Cath flushed every 6 weeks. She will return in 6 months for Port-A-Cath removal if there is no evidence of recurrence of her cancer.

## 2017-03-20 IMAGING — CR DG CHEST 1V PORT
1 series · 1 of 1 positions shown · non-contrast
Comparison: Chest x-ray 05/07/2015.

CLINICAL DATA: 64-year-old female status post Port-A-Cath
insertion. History of mucinous at adenocarcinoma.

EXAM:
PORTABLE CHEST 1 VIEW

[ap portable]
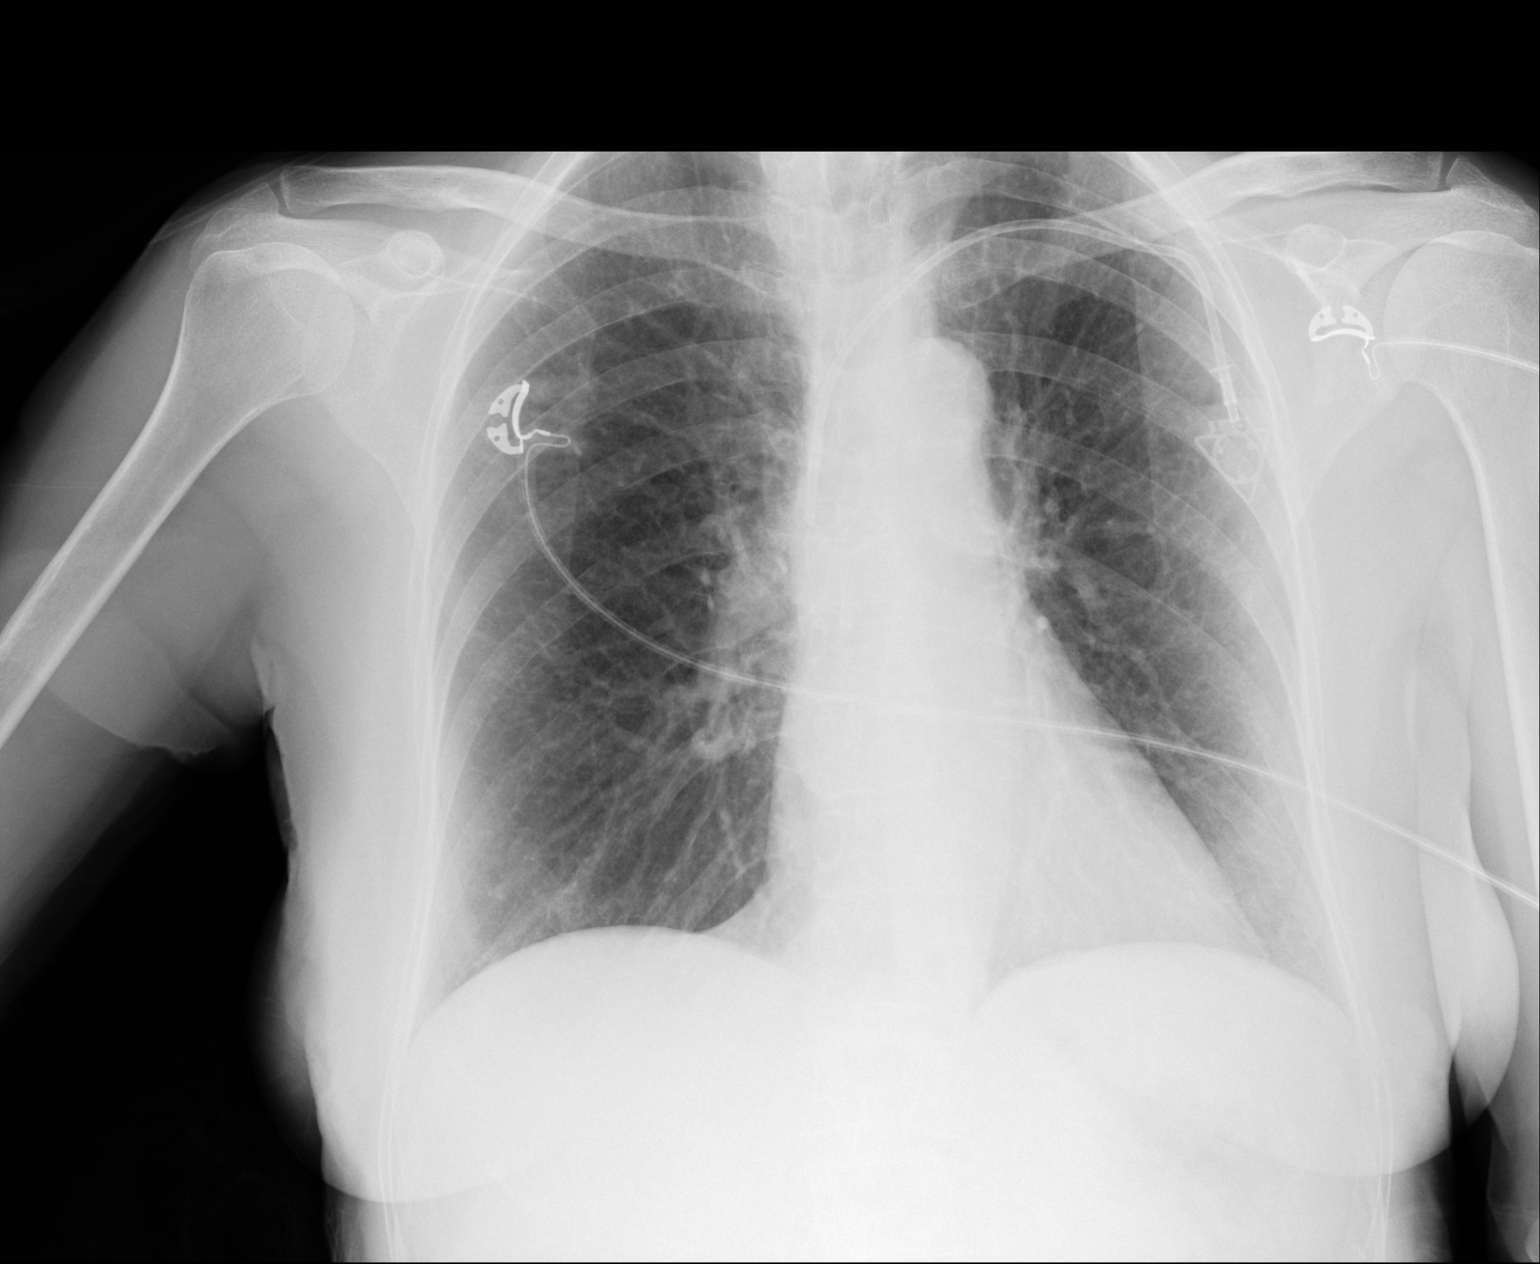

[1 of 1 positions shown; findings below may reference images not displayed]

FINDINGS: Left-sided single-lumen subclavian power porta cath with tip
terminating in the mid superior vena cava. Trace left apical
pneumothorax (less than 5% of the volume of the left hemithorax) is
noted. Lung volumes are otherwise normal. No consolidative airspace
disease. No pleural effusions. No evidence of pulmonary edema. No
suspicious appearing pulmonary nodules or masses are identified by
plain film examination. Heart size is normal. Upper mediastinal
contours are within normal limits.
IMPRESSION: 1. Interval placement of left-sided subclavian single-lumen power
porta cath with tip terminating in the mid superior vena cava. Trace
left-sided postprocedural pneumothorax (less than 5% of the volume
of the left hemithorax) noted. Close attention on followup studies
is recommended to ensure stability or resolution of this finding.
Critical Value/emergent results were called by telephone at the time
of interpretation on 07/18/2015 at [DATE] to Dr. LAMIN QUACH, who
verbally acknowledged these results.

## 2017-03-20 IMAGING — DX DG CHEST 2V
2 series · 2 of 2 positions shown · non-contrast
Comparison: 07/18/2015

CLINICAL DATA: Status post port placement

EXAM:
CHEST  2 VIEW

[chest pa]
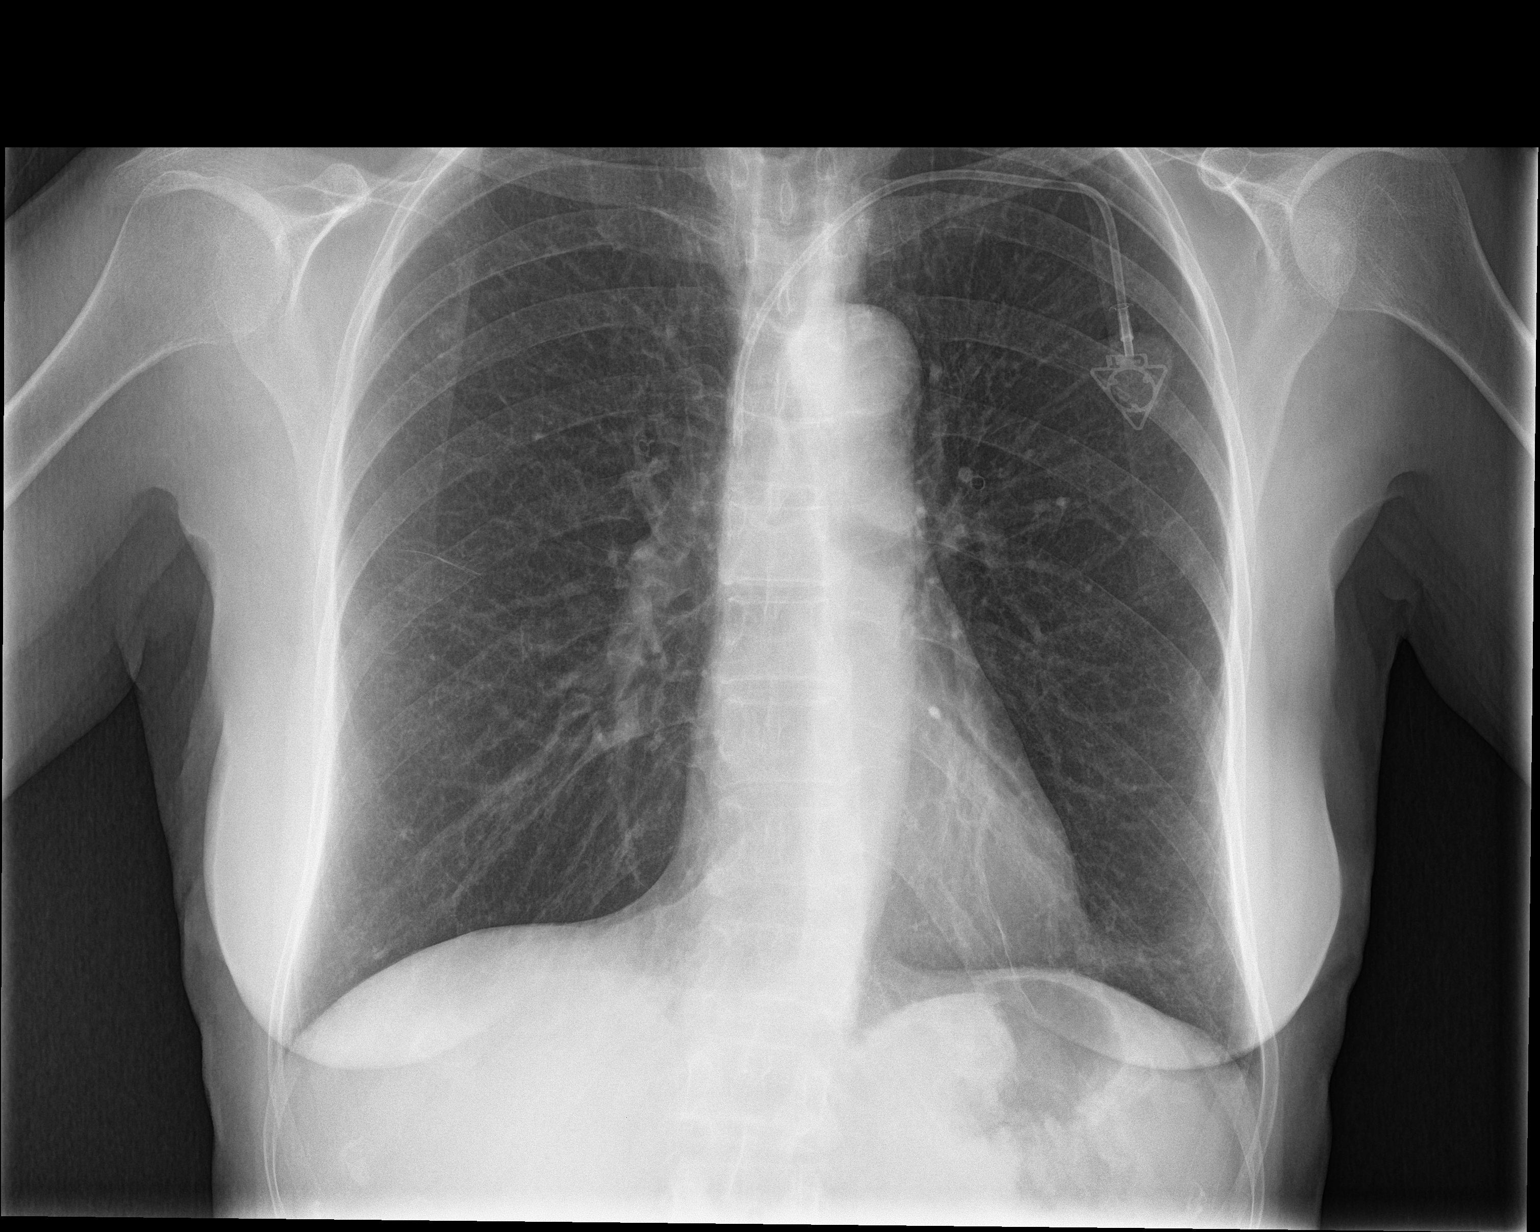

[chest lat]
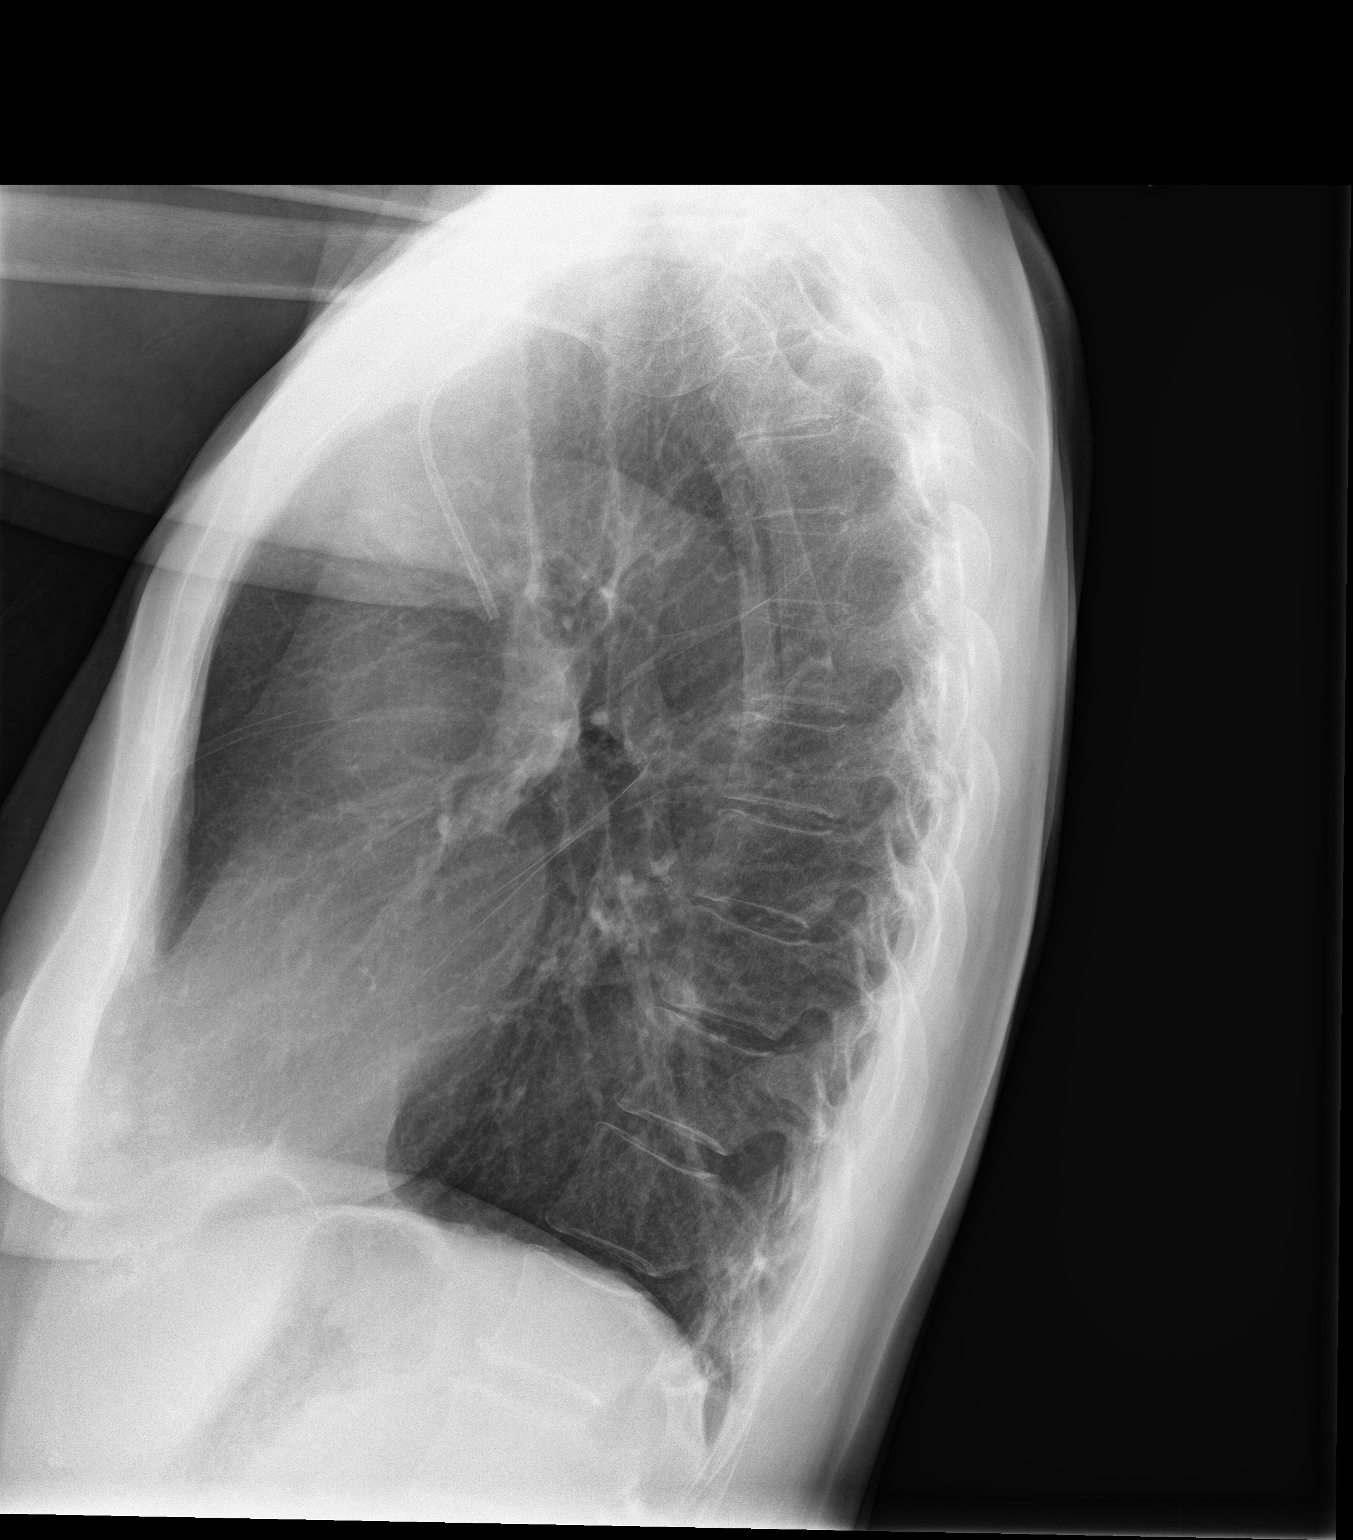

[2 of 2 positions shown; findings below may reference images not displayed]

FINDINGS: Cardiac shadow is stable. A left chest wall port is again seen. The
lungs are well aerated bilaterally. Tiny apical pneumothorax is
again seen on the left and stable. No new acute abnormality is
noted.
IMPRESSION: Stable tiny left pneumothorax

## 2017-04-20 ENCOUNTER — Encounter (HOSPITAL_COMMUNITY): Payer: Self-pay

## 2017-04-20 ENCOUNTER — Encounter (HOSPITAL_COMMUNITY): Payer: PPO | Attending: Oncology

## 2017-04-20 DIAGNOSIS — Z808 Family history of malignant neoplasm of other organs or systems: Secondary | ICD-10-CM | POA: Insufficient documentation

## 2017-04-20 DIAGNOSIS — K219 Gastro-esophageal reflux disease without esophagitis: Secondary | ICD-10-CM | POA: Insufficient documentation

## 2017-04-20 DIAGNOSIS — G620 Drug-induced polyneuropathy: Secondary | ICD-10-CM | POA: Insufficient documentation

## 2017-04-20 DIAGNOSIS — C569 Malignant neoplasm of unspecified ovary: Secondary | ICD-10-CM

## 2017-04-20 DIAGNOSIS — Z9889 Other specified postprocedural states: Secondary | ICD-10-CM | POA: Insufficient documentation

## 2017-04-20 DIAGNOSIS — Z8041 Family history of malignant neoplasm of ovary: Secondary | ICD-10-CM | POA: Insufficient documentation

## 2017-04-20 DIAGNOSIS — Z833 Family history of diabetes mellitus: Secondary | ICD-10-CM | POA: Insufficient documentation

## 2017-04-20 DIAGNOSIS — Z818 Family history of other mental and behavioral disorders: Secondary | ICD-10-CM | POA: Insufficient documentation

## 2017-04-20 DIAGNOSIS — Z90710 Acquired absence of both cervix and uterus: Secondary | ICD-10-CM | POA: Insufficient documentation

## 2017-04-20 DIAGNOSIS — Z452 Encounter for adjustment and management of vascular access device: Secondary | ICD-10-CM

## 2017-04-20 DIAGNOSIS — Z8371 Family history of colonic polyps: Secondary | ICD-10-CM | POA: Insufficient documentation

## 2017-04-20 DIAGNOSIS — Z800 Family history of malignant neoplasm of digestive organs: Secondary | ICD-10-CM | POA: Insufficient documentation

## 2017-04-20 DIAGNOSIS — Z87891 Personal history of nicotine dependence: Secondary | ICD-10-CM | POA: Insufficient documentation

## 2017-04-20 DIAGNOSIS — D6959 Other secondary thrombocytopenia: Secondary | ICD-10-CM | POA: Insufficient documentation

## 2017-04-20 DIAGNOSIS — Z825 Family history of asthma and other chronic lower respiratory diseases: Secondary | ICD-10-CM | POA: Insufficient documentation

## 2017-04-20 MED ORDER — SODIUM CHLORIDE 0.9% FLUSH
10.0000 mL | INTRAVENOUS | Status: DC | PRN
  Administered 2017-04-20: 10 mL via INTRAVENOUS
  Filled 2017-04-20: qty 10

## 2017-04-20 MED ORDER — HEPARIN SOD (PORK) LOCK FLUSH 100 UNIT/ML IV SOLN
500.0000 [IU] | Freq: Once | INTRAVENOUS | Status: AC
  Administered 2017-04-20: 500 [IU] via INTRAVENOUS

## 2017-04-20 MED ORDER — SODIUM CHLORIDE 0.9% FLUSH: 10.0000 mL | INTRAVENOUS | Status: DC | PRN

## 2017-04-20 MED ORDER — HEPARIN SOD (PORK) LOCK FLUSH 100 UNIT/ML IV SOLN
500.0000 [IU] | Freq: Once | INTRAVENOUS | Status: DC
  Filled 2017-04-20: qty 5

## 2017-04-20 NOTE — Progress Notes (Signed)
Natalie Ball presented for Portacath access and flush.   Portacath located left chest wall accessed with  H 20 needle.  No blood return present - flushes w/o difficulty with no s/s infiltration at site. Portacath flushed with 6ml NS and 500U/38ml Heparin and needle removed intact.  Procedure tolerated well and without incident.  Discharged ambulatory.

## 2017-06-20 ENCOUNTER — Encounter (HOSPITAL_COMMUNITY): Payer: PPO

## 2017-06-22 ENCOUNTER — Inpatient Hospital Stay (HOSPITAL_COMMUNITY): Payer: PPO | Attending: Hematology and Oncology

## 2017-06-22 ENCOUNTER — Other Ambulatory Visit: Payer: Self-pay

## 2017-06-22 ENCOUNTER — Encounter (HOSPITAL_COMMUNITY): Payer: Self-pay

## 2017-06-22 DIAGNOSIS — Z452 Encounter for adjustment and management of vascular access device: Secondary | ICD-10-CM | POA: Diagnosis not present

## 2017-06-22 DIAGNOSIS — C569 Malignant neoplasm of unspecified ovary: Secondary | ICD-10-CM | POA: Diagnosis not present

## 2017-06-22 MED ORDER — HEPARIN SOD (PORK) LOCK FLUSH 100 UNIT/ML IV SOLN
500.0000 [IU] | Freq: Once | INTRAVENOUS | Status: AC
  Administered 2017-06-22: 500 [IU] via INTRAVENOUS
  Filled 2017-06-22: qty 5

## 2017-06-22 MED ORDER — SODIUM CHLORIDE 0.9% FLUSH
10.0000 mL | INTRAVENOUS | Status: DC | PRN
  Administered 2017-06-22: 10 mL via INTRAVENOUS
  Filled 2017-06-22: qty 10

## 2017-06-22 NOTE — Progress Notes (Signed)
Natalie Ball presented for Portacath access and flush. Portacath located left chest wall accessed with  H 20 needle. No blood return, flushes easily, no signs of infiltration when flushing. Portacath flushed with 69ml NS and 500U/70ml Heparin and needle removed intact. Procedure without incident. Patient tolerated procedure well.  Treatment given per orders. Patient tolerated it well without problems. Vitals stable and discharged home from clinic ambulatory. Follow up as scheduled.

## 2017-06-22 NOTE — Patient Instructions (Signed)
[place] at [facility] Discharge Instructions  RECOMMENDATIONS MADE BY THE CONSULTANT AND ANY TEST RESULTS WILL BE SENT TO YOUR REFERRING PHYSICIAN.  Port flush done today.  Thank you for choosing [place] at [facility] to provide your oncology and hematology care.  To afford each patient quality time with our provider, please arrive at least 15 minutes before your scheduled appointment time.    If you have a lab appointment with the [facility] please come in thru the  Main Entrance and check in at the main information desk  You need to re-schedule your appointment should you arrive 10 or more minutes late.  We strive to give you quality time with our providers, and arriving late affects you and other patients whose appointments are after yours.  Also, if you no show three or more times for appointments you may be dismissed from the clinic at the providers discretion.     Again, thank you for choosing [facility].  Our hope is that these requests will decrease the amount of time that you wait before being seen by our physicians.       _____________________________________________________________  Should you have questions after your visit to [facility], please contact our office at [phone] between the hours of 8:30 a.m. and 4:30 p.m.  Voicemails left after 4:30 p.m. will not be returned until the following business day.  For prescription refill requests, have your pharmacy contact our office.       Resources For Cancer Patients and their Caregivers ? American Cancer Society: Can assist with transportation, wigs, general needs, runs Look Good Feel Better.        780-182-9655 ? Cancer Care: Provides financial assistance, online support groups, medication/co-pay assistance.  1-800-813-HOPE 445 180 7950) ? [place] Assists [place] cancer patients and their families through  emotional , educational and financial support.  360-607-3809 ? [place] DSS Where to apply for food stamps, Medicaid and utility assistance. 917-096-7657 ? RCATS: Transportation to medical appointments. (430) 383-6720 ? Social Security Administration: May apply for disability if have a Stage IV cancer. 216-222-1600 (564)748-2270 ? [employer], Disability and Transit Services: Assists with nutrition, care and transit needs. [place] Support Programs: @10RELATIVEDAYS @ > Cancer Support Group  2nd Tuesday of the month 1pm-2pm, Journey Room  > Creative Journey  3rd Tuesday of the month 1130am-1pm, Journey Room  > Look Good Feel Better  1st Wednesday of the month 10am-12 noon, Journey Room (Call [facility] to register [phone])

## 2017-07-13 DIAGNOSIS — S134XXA Sprain of ligaments of cervical spine, initial encounter: Secondary | ICD-10-CM | POA: Diagnosis not present

## 2017-07-13 DIAGNOSIS — M9901 Segmental and somatic dysfunction of cervical region: Secondary | ICD-10-CM | POA: Diagnosis not present

## 2017-07-20 DIAGNOSIS — S134XXA Sprain of ligaments of cervical spine, initial encounter: Secondary | ICD-10-CM | POA: Diagnosis not present

## 2017-07-20 DIAGNOSIS — M9901 Segmental and somatic dysfunction of cervical region: Secondary | ICD-10-CM | POA: Diagnosis not present

## 2017-07-27 DIAGNOSIS — S134XXA Sprain of ligaments of cervical spine, initial encounter: Secondary | ICD-10-CM | POA: Diagnosis not present

## 2017-07-27 DIAGNOSIS — M9901 Segmental and somatic dysfunction of cervical region: Secondary | ICD-10-CM | POA: Diagnosis not present

## 2017-08-03 DIAGNOSIS — S134XXA Sprain of ligaments of cervical spine, initial encounter: Secondary | ICD-10-CM | POA: Diagnosis not present

## 2017-08-03 DIAGNOSIS — M9901 Segmental and somatic dysfunction of cervical region: Secondary | ICD-10-CM | POA: Diagnosis not present

## 2017-08-10 DIAGNOSIS — S134XXA Sprain of ligaments of cervical spine, initial encounter: Secondary | ICD-10-CM | POA: Diagnosis not present

## 2017-08-10 DIAGNOSIS — M9901 Segmental and somatic dysfunction of cervical region: Secondary | ICD-10-CM | POA: Diagnosis not present

## 2017-08-17 DIAGNOSIS — S134XXA Sprain of ligaments of cervical spine, initial encounter: Secondary | ICD-10-CM | POA: Diagnosis not present

## 2017-08-17 DIAGNOSIS — M9901 Segmental and somatic dysfunction of cervical region: Secondary | ICD-10-CM | POA: Diagnosis not present

## 2017-08-24 ENCOUNTER — Inpatient Hospital Stay (HOSPITAL_COMMUNITY): Payer: PPO | Attending: Internal Medicine | Admitting: Internal Medicine

## 2017-08-24 ENCOUNTER — Inpatient Hospital Stay (HOSPITAL_COMMUNITY): Payer: PPO | Attending: Hematology and Oncology

## 2017-08-24 ENCOUNTER — Encounter (HOSPITAL_COMMUNITY): Payer: Self-pay | Admitting: Internal Medicine

## 2017-08-24 VITALS — BP 135/73 | HR 89 | Temp 98.2°F | Resp 18 | Wt 151.4 lb

## 2017-08-24 DIAGNOSIS — Z90722 Acquired absence of ovaries, bilateral: Secondary | ICD-10-CM | POA: Diagnosis not present

## 2017-08-24 DIAGNOSIS — F419 Anxiety disorder, unspecified: Secondary | ICD-10-CM | POA: Diagnosis not present

## 2017-08-24 DIAGNOSIS — Z90710 Acquired absence of both cervix and uterus: Secondary | ICD-10-CM

## 2017-08-24 DIAGNOSIS — Z9221 Personal history of antineoplastic chemotherapy: Secondary | ICD-10-CM

## 2017-08-24 DIAGNOSIS — C569 Malignant neoplasm of unspecified ovary: Secondary | ICD-10-CM | POA: Diagnosis not present

## 2017-08-24 DIAGNOSIS — Z800 Family history of malignant neoplasm of digestive organs: Secondary | ICD-10-CM | POA: Diagnosis not present

## 2017-08-24 DIAGNOSIS — C801 Malignant (primary) neoplasm, unspecified: Secondary | ICD-10-CM

## 2017-08-24 DIAGNOSIS — Z79899 Other long term (current) drug therapy: Secondary | ICD-10-CM | POA: Diagnosis not present

## 2017-08-24 DIAGNOSIS — K219 Gastro-esophageal reflux disease without esophagitis: Secondary | ICD-10-CM | POA: Diagnosis not present

## 2017-08-24 DIAGNOSIS — M9901 Segmental and somatic dysfunction of cervical region: Secondary | ICD-10-CM | POA: Diagnosis not present

## 2017-08-24 DIAGNOSIS — S134XXA Sprain of ligaments of cervical spine, initial encounter: Secondary | ICD-10-CM | POA: Diagnosis not present

## 2017-08-24 DIAGNOSIS — Z8041 Family history of malignant neoplasm of ovary: Secondary | ICD-10-CM

## 2017-08-24 LAB — COMPREHENSIVE METABOLIC PANEL
ALBUMIN: 4.5 g/dL (ref 3.5–5.0)
ALK PHOS: 72 U/L (ref 38–126)
ALT: 18 U/L (ref 14–54)
ANION GAP: 12 (ref 5–15)
AST: 20 U/L (ref 15–41)
BILIRUBIN TOTAL: 1 mg/dL (ref 0.3–1.2)
BUN: 17 mg/dL (ref 6–20)
CALCIUM: 9.8 mg/dL (ref 8.9–10.3)
CO2: 24 mmol/L (ref 22–32)
Chloride: 103 mmol/L (ref 101–111)
Creatinine, Ser: 0.84 mg/dL (ref 0.44–1.00)
GFR calc Af Amer: >60 mL/min (ref >60)
GFR calc non Af Amer: >60 mL/min (ref >60)
GLUCOSE: 93 mg/dL (ref 65–99)
Potassium: 4 mmol/L (ref 3.5–5.1)
SODIUM: 139 mmol/L (ref 135–145)
TOTAL PROTEIN: 7.4 g/dL (ref 6.5–8.1)

## 2017-08-24 LAB — CBC WITH DIFFERENTIAL/PLATELET
BASOS ABS: 0 10*3/uL (ref 0.0–0.1)
BASOS PCT: 0 %
Eosinophils Absolute: 0.1 10*3/uL (ref 0.0–0.7)
Eosinophils Relative: 1 %
HEMATOCRIT: 38.8 % (ref 36.0–46.0)
HEMOGLOBIN: 12.9 g/dL (ref 12.0–15.0)
Lymphocytes Relative: 20 %
Lymphs Abs: 1.4 10*3/uL (ref 0.7–4.0)
MCH: 32.2 pg (ref 26.0–34.0)
MCHC: 33.2 g/dL (ref 30.0–36.0)
MCV: 96.8 fL (ref 78.0–100.0)
Monocytes Absolute: 0.5 10*3/uL (ref 0.1–1.0)
Monocytes Relative: 8 %
NEUTROS ABS: 4.9 10*3/uL (ref 1.7–7.7)
NEUTROS PCT: 71 %
Platelets: 231 10*3/uL (ref 150–400)
RBC: 4.01 MIL/uL (ref 3.87–5.11)
RDW: 12.9 % (ref 11.5–15.5)
WBC: 7 10*3/uL (ref 4.0–10.5)

## 2017-08-24 MED ORDER — HEPARIN SOD (PORK) LOCK FLUSH 100 UNIT/ML IV SOLN
500.0000 [IU] | Freq: Once | INTRAVENOUS | Status: AC
  Administered 2017-08-24: 500 [IU] via INTRAVENOUS

## 2017-08-24 MED ORDER — SODIUM CHLORIDE 0.9% FLUSH
10.0000 mL | INTRAVENOUS | Status: DC | PRN
  Administered 2017-08-24: 10 mL via INTRAVENOUS
  Filled 2017-08-24: qty 10

## 2017-08-24 NOTE — Patient Instructions (Signed)
[place] at [facility] Discharge Instructions   You were seen today by Dr. Zoila Shutter She went over your recent labs and everything was stable. She discussed getting another scan in December just to make sure things are still good. Then go for the port removal after scan is done. Have your mammogram done this year. We will see you in December of this year for follow up and labs. Continue your port flushes every 8 weeks.    Thank you for choosing [place] at [facility] to provide your oncology and hematology care.  To afford each patient quality time with our provider, please arrive at least 15 minutes before your scheduled appointment time.    If you have a lab appointment with the [facility] please come in thru the  Main Entrance and check in at the main information desk  You need to re-schedule your appointment should you arrive 10 or more minutes late.  We strive to give you quality time with our providers, and arriving late affects you and other patients whose appointments are after yours.  Also, if you no show three or more times for appointments you may be dismissed from the clinic at the providers discretion.     Again, thank you for choosing [facility].  Our hope is that these requests will decrease the amount of time that you wait before being seen by our physicians.       _____________________________________________________________  Should you have questions after your visit to [facility], please contact our office at [phone] between the hours of 8:30 a.m. and 4:30 p.m.  Voicemails left after 4:30 p.m. will not be returned until the following business day.  For prescription refill requests, have your pharmacy contact our office.       Resources For Cancer Patients and their Caregivers ? American Cancer Society: Can assist with transportation, wigs, general needs, runs Look Good Feel  Better.        315-227-8216 ? Cancer Care: Provides financial assistance, online support groups, medication/co-pay assistance.  1-800-813-HOPE 5714586162) ? [place] Assists [place] cancer patients and their families through emotional , educational and financial support.  773-597-1480 ? [place] DSS Where to apply for food stamps, Medicaid and utility assistance. 737-641-1127 ? RCATS: Transportation to medical appointments. 7471603449 ? Social Security Administration: May apply for disability if have a Stage IV cancer. 904-840-2663 561-317-4524 ? [employer], Disability and Transit Services: Assists with nutrition, care and transit needs. [place] Support Programs:   > Cancer Support Group  2nd Tuesday of the month 1pm-2pm, Journey Room   > Creative Journey  3rd Tuesday of the month 1130am-1pm, Journey Room

## 2017-08-24 NOTE — Progress Notes (Signed)
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  Assessment and Plan:  1.  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  She completed 11/12 planned cycles of FOLFOX secondary to prolonged cytopenias and neuropathy in spite of dose reductions and delays in therapy.  She denies any complaints today.  She denies any weight loss. She no abdominal pain or bloating.  Last CT chest abdomen and pelvis was done in December 2017 and showed no signs of recurrence.  She was given option of repeat imaging in 1 week but desires to wait until December for scan evaluation.  She will be set up for repeat imaging in December 2019 and will return to clinic to go over the results.  Repeat labs will be performed at that time.  She is advised to notify the office if she has any problems prior to that visit.Marland Kitchen She will undergo PAC flush today and desires to wait for PAC removal.    2.  Persistent post operative elevation in CA-125 at 304.9 U/ml, normal post-op CEA. CA 125 done 08/2016 was normal at 12.9.  She will have repeat tumor markers performed on return to clinic.  3.  Chemotherapy induced neuropathy.  Will continue to monitor for improvement over time.    4.  Anxiety.  Continue to follow-up with PCP for management.       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 Prominent right gonadal vasculature. Tiny hypodensity in the dome of the liver is too small to characterize, but stable.      06/26/2015 Pathology Results     Adnexa - ovary +/- tube, neoplastic, right - MUCINOUS ADENOCARCINOMA, 13 CM. - OVARIAN SURFACE NOT INVOLVED BY TUMOR. - UNREMARKABLE FALLOPIAN TUBE WITH NO ENDOMETRIOSIS OR MALIGNANCY.      06/26/2015 Procedure    Exploratory laparotomy with total hysterectomy bilateral salpingo-oophorectomy, pelvic and para-aortic lymph node dissection, omentectomy washings and biopsies for ovarian cancer by Dr. [name]      06/27/2015 Pathology Results    PERITONEAL WASHING(SPECIMEN 1 OF 1 COLLECTED 06/27/15): ATYPICAL CELLS. Immunohistochemistry calretinin, cytokeratin 5/6, estrogen receptor, progesterone receptor and WT-1 is performed. 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      07/26/2015 Adverse Reaction    Loose stools, 2-3 per 24 hours      07/29/2015 Treatment Plan Change    5FU bolus discontinued due to loose stools.      08/05/2015 Adverse Reaction    Reaction to Oxaliplatin      09/01/2015 Treatment Plan Change    Decrease 5FU CI by 10%      09/30/2015 Treatment Plan Change    Oxaliplatin reduced by 10%      12/02/2015 Treatment Plan Change    Treatement deferred x 1 week, thrombocytopenia at 85,000      05/17/2016 Imaging    CT  scans :Status post total abdominal hysterectomy and bilateral salpingo-oophorectomy, with no evidence to suggest local recurrence of disease or metastatic disease in the abdomen or pelvis. Previously noted tiny volume of ascites and mildly enlarged right inguinal lymph node have both resolved. 2. Incidental findings, as above.       Problem List Patient Active Problem List   Diagnosis Date Noted  . Nausea without vomiting [R11.0] 09/16/2015  . Genetic testing [Z13.79] 08/18/2015  . Family history of ovarian cancer [Z80.41]    . Family history of pancreatic cancer [Z80.0]   . Family history of colon cancer [Z80.0]   . 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 Allergy   . Complication of anesthesia   . Family history of colon cancer   . Family history of ovarian cancer   . Family history of pancreatic cancer   . GERD (gastroesophageal reflux disease)   . Headache   . History of bronchitis   . 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 Pneumonia   . PONV (postoperative nausea and vomiting)   . Tinnitus   . Urinary urgency   . Vertigo   . Wears glasses     Past Surgical History Past Surgical History:  Procedure Laterality Date  . ABDOMINAL HYSTERECTOMY N/A 06/26/2015   Procedure: TOTAL HYSTERECTOMY ABDOMINAL;  Surgeon: [name], MD;  Location: [facility];  Service: Gynecology;  Laterality: N/A;  . APPENDECTOMY    . DEBULKING N/A 06/26/2015   Procedure: bilateral periaortic lymph node dissection biopsies and peritoneal washings;  Surgeon: [name], MD;  Location: [facility];  Service: Gynecology;  Laterality: N/A;  . DIAGNOSTIC LAPAROSCOPY  1977  . LAPAROTOMY N/A 06/26/2015   Procedure: EXPLORATORY LAPAROTOMY;  Surgeon: [name], MD;  Location: [facility];  Service: Gynecology;  Laterality: N/A;  . OMENTECTOMY N/A 06/26/2015   Procedure:  OMENTECTOMY;  Surgeon: [name], MD;  Location: [facility];  Service: Gynecology;  Laterality: N/A;  . PORTACATH PLACEMENT Left 07/18/2015   Procedure: INSERTION PORT-A-CATH;  Surgeon: Aviva Signs, MD;  Location: [facility];  Service: General;  Laterality: Left;  pt knows to arrive at 6:15  . rectal fusula  1980  . SALPINGOOPHORECTOMY Bilateral 06/26/2015   Procedure: SALPINGO OOPHORECTOMY;  Surgeon: [name], MD;  Location: [facility];  Service: Gynecology;  Laterality: Bilateral;  . TUBAL LIGATION  1974    Family History Family History  Problem Relation Age of Onset  . Cancer Father    . Colon polyps Father   . Ovarian cancer Maternal Aunt   . Colon cancer Maternal Aunt   . Pancreatic cancer Maternal Uncle   . Colon polyps Mother   . Colon polyps Sister   . Cancer Paternal Aunt        NOS  . Heart defect Maternal Grandmother        hole in the heart  . COPD Maternal Grandfather   . Colon cancer Paternal Grandmother        dx possibly under 15  . Mental illness Sister   . Uterine cancer Maternal Aunt        dx possibly in her 70s  . Pancreatic cancer Maternal Aunt   . Cancer Paternal Aunt        NOS  . Cancer Cousin        adrenal cancer dx in his 26s; maternal cousin  . Ovarian cancer Cousin  dx in her 76s; maternal cousin  . Diabetes Daughter      Social History  reports that she quit smoking about 29 years ago. Her smoking use included cigarettes. She has a 19.00 pack-year smoking history. she has never used smokeless tobacco. She reports that she drinks alcohol. She reports that she does not use drugs.  Medications  Current Outpatient Medications:  .  acetaminophen (TYLENOL) 500 MG tablet, Take 500 mg by mouth every 6 (six) hours as needed., Disp: , Rfl:  .  fluticasone (FLONASE) 50 MCG/ACT nasal spray, Place 1 spray into both nostrils daily., Disp: , Rfl:  .  ibuprofen (ADVIL,MOTRIN) 200 MG tablet, Take 400 mg by mouth every 6 (six) hours as needed for headache or moderate pain. Reported on 07/14/2015, Disp: , Rfl:  .  lidocaine-prilocaine (EMLA) cream, Apply a quarter size amount to port site 1 hour prior to chemo. Do not rub in. Cover with plastic wrap. (Patient not taking: Reported on 06/22/2017), Disp: 30 g, Rfl: 3 .  loratadine (CLARITIN) 10 MG tablet, Take 10 mg by mouth daily as needed for allergies., Disp: , Rfl:  .  LORazepam (ATIVAN) 0.5 MG tablet, Take 1 tablet (0.5 mg total) by mouth every 8 (eight) hours., Disp: 60 tablet, Rfl: 1 .  Papaya Enzyme CHEW, Chew 1 tablet by mouth daily. Reported on 10/28/2015, Disp: , Rfl:   Current  Facility-Administered Medications:  .  sodium chloride flush (NS) 0.9 % injection 10 mL, 10 mL, Intravenous, PRN, Pattrick Bady, Mathis Dad, MD, 10 mL at 08/24/17 1522  Facility-Administered Medications Ordered in Other Visits:  .  dexamethasone (DECADRON) 8 mg in sodium chloride 0.9 % 50 mL IVPB, 8 mg, Intravenous, Once, Kefalas, Thomas S, PA-C .  dexamethasone (DECADRON) 8 mg in sodium chloride 0.9 % 50 mL IVPB, 8 mg, Intravenous, Once, Kefalas, Thomas S, PA-C .  prochlorperazine (COMPAZINE) injection 10 mg, 10 mg, Intravenous, Once, Kefalas, Thomas S, PA-C .  prochlorperazine (COMPAZINE) injection 10 mg, 10 mg, Intravenous, Once, Kefalas, Thomas S, PA-C  Allergies Sulfur  Review of Systems Review of Systems - Oncology ROS as per HPI otherwise 12 point ROS is negative.   Physical Exam  Vitals Wt Readings from Last 3 Encounters:  08/24/17 151 lb 6.4 oz (68.7 kg)  03/08/17 143 lb (64.9 kg)  02/23/17 140 lb 11.2 oz (63.8 kg)   Temp Readings from Last 3 Encounters:  08/24/17 98.2 F (36.8 C) (Oral)  06/22/17 98.3 F (36.8 C) (Oral)  04/20/17 99 F (37.2 C) (Oral)   BP Readings from Last 3 Encounters:  08/24/17 135/73  06/22/17 (!) 156/77  04/20/17 130/66   Pulse Readings from Last 3 Encounters:  08/24/17 89  06/22/17 60  04/20/17 61   Constitutional: Well-developed, well-nourished, and in no distress.   HENT: Head: Normocephalic and atraumatic.  Mouth/Throat: No oropharyngeal exudate. Mucosa moist. Eyes: Pupils are equal, round, and reactive to light. Conjunctivae are normal. No scleral icterus.  Neck: Normal range of motion. Neck supple. No JVD present.  Cardiovascular: Normal rate, regular rhythm and normal heart sounds.  Exam reveals no gallop and no friction rub.   No murmur heard. Pulmonary/Chest: Effort normal and breath sounds normal. No respiratory distress. No wheezes.No rales.  Abdominal: Soft. Bowel sounds are normal. No distension. There is no tenderness. There is no  guarding.  Musculoskeletal: No edema or tenderness.  Lymphadenopathy: No cervical, axillary or supraclavicular adenopathy.  Neurological: Alert and oriented to person, place, and time. No cranial nerve deficit.  Skin: Skin is warm and dry. No rash noted. No erythema. No pallor.  Psychiatric: Affect and judgment normal.   Labs Appointment on 08/24/2017  Component Date Value Ref Range Status  . WBC 08/24/2017 7.0  4.0 - 10.5 K/uL Final  . RBC 08/24/2017 4.01  3.87 - 5.11 MIL/uL Final  . Hemoglobin 08/24/2017 12.9  12.0 - 15.0 g/dL Final  . HCT 08/24/2017 38.8  36.0 - 46.0 % Final  . MCV 08/24/2017 96.8  78.0 - 100.0 fL Final  . MCH 08/24/2017 32.2  26.0 - 34.0 pg Final  . MCHC 08/24/2017 33.2  30.0 - 36.0 g/dL Final  . RDW 08/24/2017 12.9  11.5 - 15.5 % Final  . Platelets 08/24/2017 231  150 - 400 K/uL Final  . Neutrophils Relative % 08/24/2017 71  % Final  . Neutro Abs 08/24/2017 4.9  1.7 - 7.7 K/uL Final  . Lymphocytes Relative 08/24/2017 20  % Final  . Lymphs Abs 08/24/2017 1.4  0.7 - 4.0 K/uL Final  . Monocytes Relative 08/24/2017 8  % Final  . Monocytes Absolute 08/24/2017 0.5  0.1 - 1.0 K/uL Final  . Eosinophils Relative 08/24/2017 1  % Final  . Eosinophils Absolute 08/24/2017 0.1  0.0 - 0.7 K/uL Final  . Basophils Relative 08/24/2017 0  % Final  . Basophils Absolute 08/24/2017 0.0  0.0 - 0.1 K/uL Final   Performed at [facility], [address] [place], [place] [address]  . Sodium 08/24/2017 139  135 - 145 mmol/L Final  . Potassium 08/24/2017 4.0  3.5 - 5.1 mmol/L Final  . Chloride 08/24/2017 103  101 - 111 mmol/L Final  . CO2 08/24/2017 24  22 - 32 mmol/L Final  . Glucose, Bld 08/24/2017 93  65 - 99 mg/dL Final  . BUN 08/24/2017 17  6 - 20 mg/dL Final  . Creatinine, Ser 08/24/2017 0.84  0.44 - 1.00 mg/dL Final  . Calcium 08/24/2017 9.8  8.9 - 10.3 mg/dL Final  . Total Protein 08/24/2017 7.4  6.5 - 8.1 g/dL Final  . Albumin 08/24/2017 4.5  3.5 - 5.0 g/dL Final  . AST  08/24/2017 20  15 - 41 U/L Final  . ALT 08/24/2017 18  14 - 54 U/L Final  . Alkaline Phosphatase 08/24/2017 72  38 - 126 U/L Final  . Total Bilirubin 08/24/2017 1.0  0.3 - 1.2 mg/dL Final  . GFR calc non Af Amer 08/24/2017 >60  >60 mL/min Final  . GFR calc Af Amer 08/24/2017 >60  >60 mL/min Final   Comment: (NOTE) The eGFR has been calculated using the CKD EPI equation. This calculation has not been validated in all clinical situations. eGFR's persistently <60 mL/min signify possible Chronic Kidney Disease.   Georgiann Hahn gap 08/24/2017 12  5 - 15 Final   Performed at [facility], [address] [place], [place] [address]     Pathology Orders Placed This Encounter  Procedures  . CT Chest W Contrast    Standing Status:   Future    Standing Expiration Date:   08/24/2018    Order Specific Question:   If indicated for the ordered procedure, I authorize the administration of contrast media per Radiology protocol    Answer:   Yes    Order Specific Question:   Preferred imaging location?    Answer:   [facility]    Order Specific Question:   Radiology Contrast Protocol - do NOT remove file path    Answer:   [email or web address]    Order Specific Question:   Reason for Exam additional comments    Answer:   ovarian adenocarcinoma  . CT Abdomen Pelvis W Contrast    Standing Status:   Future    Standing Expiration Date:   08/24/2018    Order Specific Question:   If indicated for the ordered procedure, I authorize the administration of contrast media per Radiology protocol    Answer:   Yes    Order Specific Question:   Preferred imaging location?    Answer:   [facility]    Order Specific Question:   Radiology Contrast Protocol - do NOT remove file path    Answer:   [email or web address]    Order Specific Question:   Reason for Exam additional comments    Answer:   ovarian neoplasm  . CBC with Differential/Platelet    Standing  Status:   Future    Standing Expiration Date:   08/25/2018  . Comprehensive metabolic panel    Standing Status:   Future    Standing Expiration Date:   08/25/2018  . Lactate dehydrogenase    Standing Status:   Future    Standing Expiration Date:   08/25/2018  . CA 125    Standing Status:   Future    Standing Expiration Date:   08/25/2018       Zoila Shutter MD

## 2017-08-25 LAB — CA 125: Cancer Antigen (CA) 125: 14.1 U/mL (ref 0.0–38.1)

## 2017-09-07 DIAGNOSIS — M9901 Segmental and somatic dysfunction of cervical region: Secondary | ICD-10-CM | POA: Diagnosis not present

## 2017-09-07 DIAGNOSIS — S134XXA Sprain of ligaments of cervical spine, initial encounter: Secondary | ICD-10-CM | POA: Diagnosis not present

## 2017-09-15 DIAGNOSIS — K529 Noninfective gastroenteritis and colitis, unspecified: Secondary | ICD-10-CM | POA: Diagnosis not present

## 2017-09-15 DIAGNOSIS — C569 Malignant neoplasm of unspecified ovary: Secondary | ICD-10-CM | POA: Diagnosis not present

## 2017-09-15 DIAGNOSIS — Z6823 Body mass index (BMI) 23.0-23.9, adult: Secondary | ICD-10-CM | POA: Diagnosis not present

## 2017-09-15 DIAGNOSIS — Z713 Dietary counseling and surveillance: Secondary | ICD-10-CM | POA: Diagnosis not present

## 2017-09-15 DIAGNOSIS — Z299 Encounter for prophylactic measures, unspecified: Secondary | ICD-10-CM | POA: Diagnosis not present

## 2017-09-21 DIAGNOSIS — S134XXA Sprain of ligaments of cervical spine, initial encounter: Secondary | ICD-10-CM | POA: Diagnosis not present

## 2017-09-21 DIAGNOSIS — M9901 Segmental and somatic dysfunction of cervical region: Secondary | ICD-10-CM | POA: Diagnosis not present

## 2017-10-05 DIAGNOSIS — S134XXA Sprain of ligaments of cervical spine, initial encounter: Secondary | ICD-10-CM | POA: Diagnosis not present

## 2017-10-05 DIAGNOSIS — M9901 Segmental and somatic dysfunction of cervical region: Secondary | ICD-10-CM | POA: Diagnosis not present

## 2017-10-26 ENCOUNTER — Encounter (HOSPITAL_COMMUNITY): Payer: Self-pay

## 2017-10-26 ENCOUNTER — Inpatient Hospital Stay (HOSPITAL_COMMUNITY): Payer: PPO | Attending: Hematology and Oncology

## 2017-10-26 DIAGNOSIS — Z90710 Acquired absence of both cervix and uterus: Secondary | ICD-10-CM | POA: Insufficient documentation

## 2017-10-26 DIAGNOSIS — Z90722 Acquired absence of ovaries, bilateral: Secondary | ICD-10-CM | POA: Diagnosis not present

## 2017-10-26 DIAGNOSIS — Z452 Encounter for adjustment and management of vascular access device: Secondary | ICD-10-CM | POA: Diagnosis not present

## 2017-10-26 DIAGNOSIS — Z9221 Personal history of antineoplastic chemotherapy: Secondary | ICD-10-CM | POA: Insufficient documentation

## 2017-10-26 DIAGNOSIS — C569 Malignant neoplasm of unspecified ovary: Secondary | ICD-10-CM | POA: Insufficient documentation

## 2017-10-26 MED ORDER — SODIUM CHLORIDE 0.9% FLUSH
10.0000 mL | INTRAVENOUS | Status: DC | PRN
  Administered 2017-10-26: 10 mL via INTRAVENOUS
  Filled 2017-10-26: qty 10

## 2017-10-26 MED ORDER — HEPARIN SOD (PORK) LOCK FLUSH 100 UNIT/ML IV SOLN
500.0000 [IU] | Freq: Once | INTRAVENOUS | Status: AC
  Administered 2017-10-26: 500 [IU] via INTRAVENOUS

## 2017-10-26 NOTE — Patient Instructions (Signed)
[facility] at [facility] Discharge Instructions  Portacath flushed per protocol today. Follow-up as scheduled. Call clinic for any questions or concerns   Thank you for choosing [facility] at [facility] to provide your oncology and hematology care.  To afford each patient quality time with our provider, please arrive at least 15 minutes before your scheduled appointment time.   If you have a lab appointment with the Cancer Center please come in thru the  Main Entrance and check in at the main information desk  You need to re-schedule your appointment should you arrive 10 or more minutes late.  We strive to give you quality time with our providers, and arriving late affects you and other patients whose appointments are after yours.  Also, if you no show three or more times for appointments you may be dismissed from the clinic at the providers discretion.     Again, thank you for choosing [facility].  Our hope is that these requests will decrease the amount of time that you wait before being seen by our physicians.       _____________________________________________________________  Should you have questions after your visit to [facility], please contact our office at [phone] between the hours of 8:30 a.m. and 4:30 p.m.  Voicemails left after 4:30 p.m. will not be returned until the following business day.  For prescription refill requests, have your pharmacy contact our office.       Resources For Cancer Patients and their Caregivers ? American Cancer Society: Can assist with transportation, wigs, general needs, runs Look Good Feel Better.        1-888-227-6333 ? Cancer Care: Provides financial assistance, online support groups, medication/co-pay assistance.  1-800-813-HOPE (4673) ? [facility] Assists [place] cancer patients and their families through emotional , educational and  financial support.  336-427-4357 ? [place] DSS Where to apply for food stamps, Medicaid and utility assistance. 336-342-1394 ? [employer]: Transportation to medical appointments. 336-347-2287 ? Social Security Administration: May apply for disability if have a Stage IV cancer. 336-342-7796 1-800-772-1213 ? [place] Aging, Disability and Transit Services: Assists with nutrition, care and transit needs. 336-349-2343  Cancer Center Support Programs:   > Cancer Support Group  2nd Tuesday of the month 1pm-2pm, Journey Room   > Creative Journey  3rd Tuesday of the month 1130am-1pm, Journey Room     

## 2017-10-26 NOTE — Progress Notes (Signed)
Natalie Ball tolerated portacath flush well without complaints or incident. Port accessed with 20 gauge needle with blood return noted and flushed with 10 ml NS and 5 ml Heparin easily per protocol  Then de-accessed. VSS Pt discharged self ambulatory in satisfactory condition

## 2017-11-02 DIAGNOSIS — S134XXA Sprain of ligaments of cervical spine, initial encounter: Secondary | ICD-10-CM | POA: Diagnosis not present

## 2017-11-02 DIAGNOSIS — M9901 Segmental and somatic dysfunction of cervical region: Secondary | ICD-10-CM | POA: Diagnosis not present

## 2017-11-23 DIAGNOSIS — E039 Hypothyroidism, unspecified: Secondary | ICD-10-CM | POA: Diagnosis not present

## 2017-11-23 DIAGNOSIS — Z299 Encounter for prophylactic measures, unspecified: Secondary | ICD-10-CM | POA: Diagnosis not present

## 2017-11-23 DIAGNOSIS — E7800 Pure hypercholesterolemia, unspecified: Secondary | ICD-10-CM | POA: Diagnosis not present

## 2017-11-23 DIAGNOSIS — Z7189 Other specified counseling: Secondary | ICD-10-CM | POA: Diagnosis not present

## 2017-11-23 DIAGNOSIS — C569 Malignant neoplasm of unspecified ovary: Secondary | ICD-10-CM | POA: Diagnosis not present

## 2017-11-23 DIAGNOSIS — E559 Vitamin D deficiency, unspecified: Secondary | ICD-10-CM | POA: Diagnosis not present

## 2017-11-23 DIAGNOSIS — Z6823 Body mass index (BMI) 23.0-23.9, adult: Secondary | ICD-10-CM | POA: Diagnosis not present

## 2017-11-23 DIAGNOSIS — Z1339 Encounter for screening examination for other mental health and behavioral disorders: Secondary | ICD-10-CM | POA: Diagnosis not present

## 2017-11-23 DIAGNOSIS — Z1211 Encounter for screening for malignant neoplasm of colon: Secondary | ICD-10-CM | POA: Diagnosis not present

## 2017-11-23 DIAGNOSIS — Z0000 Encounter for general adult medical examination without abnormal findings: Secondary | ICD-10-CM | POA: Diagnosis not present

## 2017-11-23 DIAGNOSIS — Z1331 Encounter for screening for depression: Secondary | ICD-10-CM | POA: Diagnosis not present

## 2017-11-24 DIAGNOSIS — E7800 Pure hypercholesterolemia, unspecified: Secondary | ICD-10-CM | POA: Diagnosis not present

## 2017-11-24 DIAGNOSIS — E559 Vitamin D deficiency, unspecified: Secondary | ICD-10-CM | POA: Diagnosis not present

## 2017-11-24 DIAGNOSIS — E039 Hypothyroidism, unspecified: Secondary | ICD-10-CM | POA: Diagnosis not present

## 2017-11-30 DIAGNOSIS — S134XXA Sprain of ligaments of cervical spine, initial encounter: Secondary | ICD-10-CM | POA: Diagnosis not present

## 2017-11-30 DIAGNOSIS — M9901 Segmental and somatic dysfunction of cervical region: Secondary | ICD-10-CM | POA: Diagnosis not present

## 2017-12-27 DIAGNOSIS — M9901 Segmental and somatic dysfunction of cervical region: Secondary | ICD-10-CM | POA: Diagnosis not present

## 2017-12-27 DIAGNOSIS — S134XXA Sprain of ligaments of cervical spine, initial encounter: Secondary | ICD-10-CM | POA: Diagnosis not present

## 2017-12-28 ENCOUNTER — Inpatient Hospital Stay (HOSPITAL_COMMUNITY): Payer: PPO | Attending: Hematology and Oncology

## 2017-12-28 ENCOUNTER — Encounter (HOSPITAL_COMMUNITY): Payer: PPO

## 2017-12-28 ENCOUNTER — Encounter (HOSPITAL_COMMUNITY): Payer: Self-pay

## 2017-12-28 DIAGNOSIS — Z452 Encounter for adjustment and management of vascular access device: Secondary | ICD-10-CM | POA: Insufficient documentation

## 2017-12-28 DIAGNOSIS — C569 Malignant neoplasm of unspecified ovary: Secondary | ICD-10-CM | POA: Insufficient documentation

## 2017-12-28 MED ORDER — SODIUM CHLORIDE 0.9% FLUSH
10.0000 mL | INTRAVENOUS | Status: DC | PRN
  Administered 2017-12-28: 10 mL via INTRAVENOUS
  Filled 2017-12-28: qty 10

## 2017-12-28 MED ORDER — HEPARIN SOD (PORK) LOCK FLUSH 100 UNIT/ML IV SOLN
500.0000 [IU] | Freq: Once | INTRAVENOUS | Status: AC
  Administered 2017-12-28: 500 [IU] via INTRAVENOUS

## 2017-12-28 NOTE — Progress Notes (Signed)
Horton Marshall tolerated portacath flush well without complaints or incident. Port accessed with 20 gauge needle with blood return noted then flushed with 10 ml NS and 5 ml Heparin easily per protocol then de-accessed. VSS Pt discharged self ambulatory in satisfactory condition

## 2017-12-28 NOTE — Patient Instructions (Signed)
[facility] at [facility] Discharge Instructions  Portacath flushed per protocol today. Follow-up as scheduled. Call clinic for any questions or concerns   Thank you for choosing [facility] at [facility] to provide your oncology and hematology care.  To afford each patient quality time with our provider, please arrive at least 15 minutes before your scheduled appointment time.   If you have a lab appointment with the Cancer Center please come in thru the  Main Entrance and check in at the main information desk  You need to re-schedule your appointment should you arrive 10 or more minutes late.  We strive to give you quality time with our providers, and arriving late affects you and other patients whose appointments are after yours.  Also, if you no show three or more times for appointments you may be dismissed from the clinic at the providers discretion.     Again, thank you for choosing [facility].  Our hope is that these requests will decrease the amount of time that you wait before being seen by our physicians.       _____________________________________________________________  Should you have questions after your visit to [facility], please contact our office at [phone] between the hours of 8:30 a.m. and 4:30 p.m.  Voicemails left after 4:30 p.m. will not be returned until the following business day.  For prescription refill requests, have your pharmacy contact our office.       Resources For Cancer Patients and their Caregivers ? American Cancer Society: Can assist with transportation, wigs, general needs, runs Look Good Feel Better.        1-888-227-6333 ? Cancer Care: Provides financial assistance, online support groups, medication/co-pay assistance.  1-800-813-HOPE (4673) ? [facility] Assists [place] cancer patients and their families through emotional , educational and  financial support.  336-427-4357 ? [place] DSS Where to apply for food stamps, Medicaid and utility assistance. 336-342-1394 ? [facility]: Transportation to medical appointments. 336-347-2287 ? Social Security Administration: May apply for disability if have a Stage IV cancer. 336-342-7796 1-800-772-1213 ? [place] Aging, Disability and Transit Services: Assists with nutrition, care and transit needs. 336-349-2343  Cancer Center Support Programs:   > Cancer Support Group  2nd Tuesday of the month 1pm-2pm, Journey Room   > Creative Journey  3rd Tuesday of the month 1130am-1pm, Journey Room     

## 2018-01-09 ENCOUNTER — Other Ambulatory Visit (HOSPITAL_COMMUNITY): Payer: Self-pay | Admitting: *Deleted

## 2018-01-09 DIAGNOSIS — C801 Malignant (primary) neoplasm, unspecified: Secondary | ICD-10-CM

## 2018-01-09 MED ORDER — LIDOCAINE-PRILOCAINE 2.5-2.5 % EX CREA: TOPICAL_CREAM | CUTANEOUS | 3 refills | Status: DC

## 2018-01-19 DIAGNOSIS — Z1231 Encounter for screening mammogram for malignant neoplasm of breast: Secondary | ICD-10-CM | POA: Diagnosis not present

## 2018-02-08 DIAGNOSIS — Z01419 Encounter for gynecological examination (general) (routine) without abnormal findings: Secondary | ICD-10-CM | POA: Diagnosis not present

## 2018-03-01 ENCOUNTER — Inpatient Hospital Stay (HOSPITAL_COMMUNITY): Payer: PPO | Attending: Internal Medicine

## 2018-03-01 ENCOUNTER — Encounter (HOSPITAL_COMMUNITY): Payer: Self-pay

## 2018-03-01 VITALS — BP 123/51 | HR 67 | Temp 98.2°F | Resp 18

## 2018-03-01 DIAGNOSIS — C569 Malignant neoplasm of unspecified ovary: Secondary | ICD-10-CM | POA: Diagnosis not present

## 2018-03-01 DIAGNOSIS — Z452 Encounter for adjustment and management of vascular access device: Secondary | ICD-10-CM | POA: Diagnosis not present

## 2018-03-01 DIAGNOSIS — S134XXA Sprain of ligaments of cervical spine, initial encounter: Secondary | ICD-10-CM | POA: Diagnosis not present

## 2018-03-01 DIAGNOSIS — G620 Drug-induced polyneuropathy: Secondary | ICD-10-CM

## 2018-03-01 DIAGNOSIS — C801 Malignant (primary) neoplasm, unspecified: Secondary | ICD-10-CM

## 2018-03-01 DIAGNOSIS — M9901 Segmental and somatic dysfunction of cervical region: Secondary | ICD-10-CM | POA: Diagnosis not present

## 2018-03-01 DIAGNOSIS — T451X5A Adverse effect of antineoplastic and immunosuppressive drugs, initial encounter: Secondary | ICD-10-CM

## 2018-03-01 MED ORDER — HEPARIN SOD (PORK) LOCK FLUSH 100 UNIT/ML IV SOLN
500.0000 [IU] | Freq: Once | INTRAVENOUS | Status: AC
  Administered 2018-03-01: 500 [IU] via INTRAVENOUS

## 2018-03-01 MED ORDER — SODIUM CHLORIDE 0.9% FLUSH
10.0000 mL | Freq: Once | INTRAVENOUS | Status: AC
  Administered 2018-03-01: 10 mL via INTRAVENOUS

## 2018-03-01 NOTE — Progress Notes (Signed)
Patients port flushed with no complaints voiced.  Blood return noted with no complaints of pain with flush.  Port site clean and dry with no bruising or swelling noted at site.  Band aid applied.  VSs with discharge and left ambulatory with no s/s of distress noted.

## 2018-03-01 NOTE — Patient Instructions (Signed)
[facility] at [facility]  Discharge Instructions:  Your port was flushed today. _______________________________________________________________  Thank you for choosing [facility] at [facility] to provide your oncology and hematology care.  To afford each patient quality time with our providers, please arrive at least 15 minutes before your scheduled appointment.  You need to re-schedule your appointment if you arrive 10 or more minutes late.  We strive to give you quality time with our providers, and arriving late affects you and other patients whose appointments are after yours.  Also, if you no show three or more times for appointments you may be dismissed from the clinic.  Again, thank you for choosing [facility] at [facility]. Our hope is that these requests will allow you access to exceptional care and in a timely manner. _______________________________________________________________  If you have questions after your visit, please contact our office at [phone] between the hours of 8:30 a.m. and 5:00 p.m. Voicemails left after 4:30 p.m. will not be returned until the following business day. _______________________________________________________________  For prescription refill requests, have your pharmacy contact our office. _______________________________________________________________  Recommendations made by the consultant and any test results will be sent to your referring physician. _______________________________________________________________ 

## 2018-04-26 DIAGNOSIS — M9901 Segmental and somatic dysfunction of cervical region: Secondary | ICD-10-CM | POA: Diagnosis not present

## 2018-04-26 DIAGNOSIS — S134XXA Sprain of ligaments of cervical spine, initial encounter: Secondary | ICD-10-CM | POA: Diagnosis not present

## 2018-05-03 ENCOUNTER — Inpatient Hospital Stay (HOSPITAL_COMMUNITY): Payer: PPO | Attending: Internal Medicine

## 2018-05-03 DIAGNOSIS — Z452 Encounter for adjustment and management of vascular access device: Secondary | ICD-10-CM | POA: Diagnosis not present

## 2018-05-03 DIAGNOSIS — Z90710 Acquired absence of both cervix and uterus: Secondary | ICD-10-CM | POA: Insufficient documentation

## 2018-05-03 DIAGNOSIS — C569 Malignant neoplasm of unspecified ovary: Secondary | ICD-10-CM | POA: Insufficient documentation

## 2018-05-03 DIAGNOSIS — Z90722 Acquired absence of ovaries, bilateral: Secondary | ICD-10-CM | POA: Diagnosis not present

## 2018-05-03 DIAGNOSIS — Z9221 Personal history of antineoplastic chemotherapy: Secondary | ICD-10-CM | POA: Insufficient documentation

## 2018-05-03 DIAGNOSIS — C801 Malignant (primary) neoplasm, unspecified: Secondary | ICD-10-CM

## 2018-05-03 LAB — CBC WITH DIFFERENTIAL/PLATELET
Abs Immature Granulocytes: 0.02 10*3/uL (ref 0.00–0.07)
Basophils Absolute: 0 10*3/uL (ref 0.0–0.1)
Basophils Relative: 1 %
Eosinophils Absolute: 0.1 10*3/uL (ref 0.0–0.5)
Eosinophils Relative: 1 %
HCT: 38.9 % (ref 36.0–46.0)
Hemoglobin: 12.6 g/dL (ref 12.0–15.0)
Immature Granulocytes: 0 %
Lymphocytes Relative: 31 %
Lymphs Abs: 1.8 10*3/uL (ref 0.7–4.0)
MCH: 32 pg (ref 26.0–34.0)
MCHC: 32.4 g/dL (ref 30.0–36.0)
MCV: 98.7 fL (ref 80.0–100.0)
Monocytes Absolute: 0.5 10*3/uL (ref 0.1–1.0)
Monocytes Relative: 8 %
Neutro Abs: 3.4 10*3/uL (ref 1.7–7.7)
Neutrophils Relative %: 59 %
Platelets: 248 10*3/uL (ref 150–400)
RBC: 3.94 MIL/uL (ref 3.87–5.11)
RDW: 12.3 % (ref 11.5–15.5)
WBC: 5.7 10*3/uL (ref 4.0–10.5)
nRBC: 0 % (ref 0.0–0.2)

## 2018-05-03 LAB — COMPREHENSIVE METABOLIC PANEL
ALBUMIN: 4.5 g/dL (ref 3.5–5.0)
ALT: 20 U/L (ref 0–44)
AST: 18 U/L (ref 15–41)
Alkaline Phosphatase: 57 U/L (ref 38–126)
Anion gap: 9 (ref 5–15)
BUN: 15 mg/dL (ref 8–23)
CHLORIDE: 105 mmol/L (ref 98–111)
CO2: 26 mmol/L (ref 22–32)
CREATININE: 0.84 mg/dL (ref 0.44–1.00)
Calcium: 9.8 mg/dL (ref 8.9–10.3)
GFR calc Af Amer: >60 mL/min (ref >60)
GLUCOSE: 97 mg/dL (ref 70–99)
POTASSIUM: 4.3 mmol/L (ref 3.5–5.1)
SODIUM: 140 mmol/L (ref 135–145)
Total Bilirubin: 0.8 mg/dL (ref 0.3–1.2)
Total Protein: 7.2 g/dL (ref 6.5–8.1)

## 2018-05-03 LAB — LACTATE DEHYDROGENASE: LDH: 146 U/L (ref 98–192)

## 2018-05-03 MED ORDER — SODIUM CHLORIDE 0.9% FLUSH
10.0000 mL | Freq: Once | INTRAVENOUS | Status: AC
  Administered 2018-05-03: 10 mL

## 2018-05-03 MED ORDER — HEPARIN SOD (PORK) LOCK FLUSH 100 UNIT/ML IV SOLN
500.0000 [IU] | Freq: Once | INTRAVENOUS | Status: AC
  Administered 2018-05-03: 500 [IU] via INTRAVENOUS

## 2018-05-03 NOTE — Patient Instructions (Signed)
[place] at [facility]  Discharge Instructions: Lab draw and port flush. _______________________________________________________________  Thank you for choosing [place] at [facility] to provide your oncology and hematology care.  To afford each patient quality time with our providers, please arrive at least 15 minutes before your scheduled appointment.  You need to re-schedule your appointment if you arrive 10 or more minutes late.  We strive to give you quality time with our providers, and arriving late affects you and other patients whose appointments are after yours.  Also, if you no show three or more times for appointments you may be dismissed from the clinic.  Again, thank you for choosing [facility] [place] hope is that these requests will allow you access to exceptional care and in a timely manner. _______________________________________________________________  If you have questions after your visit, please contact our office at [phone] between the hours of 8:30 a.m. and 5:00 p.m. Voicemails left after 4:30 p.m. will not be returned until the following business day. _______________________________________________________________  For prescription refill requests, have your pharmacy contact our office. _______________________________________________________________  Recommendations made by the consultant and any test results will be sent to your referring physician. _______________________________________________________________

## 2018-05-03 NOTE — Progress Notes (Signed)
Pt here today for labs and port flush. VSS. No complaints at this time.   Discharged from clinic ambulatory. F/U with [facility] as scheduled.

## 2018-05-04 LAB — CA 125: Cancer Antigen (CA) 125: 13.7 U/mL (ref 0.0–38.1)

## 2018-05-10 ENCOUNTER — Other Ambulatory Visit (HOSPITAL_COMMUNITY): Payer: Self-pay | Admitting: Internal Medicine

## 2018-05-17 ENCOUNTER — Ambulatory Visit (HOSPITAL_COMMUNITY)
Admission: RE | Admit: 2018-05-17 | Discharge: 2018-05-17 | Disposition: A | Discharge status: Home / Self Care | Payer: PPO | Source: Ambulatory Visit | Attending: Internal Medicine | Admitting: Internal Medicine

## 2018-05-17 DIAGNOSIS — R918 Other nonspecific abnormal finding of lung field: Secondary | ICD-10-CM | POA: Diagnosis not present

## 2018-05-17 DIAGNOSIS — Z90710 Acquired absence of both cervix and uterus: Secondary | ICD-10-CM | POA: Diagnosis not present

## 2018-05-17 DIAGNOSIS — C569 Malignant neoplasm of unspecified ovary: Secondary | ICD-10-CM | POA: Diagnosis not present

## 2018-05-17 DIAGNOSIS — K5730 Diverticulosis of large intestine without perforation or abscess without bleeding: Secondary | ICD-10-CM | POA: Insufficient documentation

## 2018-05-17 DIAGNOSIS — C801 Malignant (primary) neoplasm, unspecified: Secondary | ICD-10-CM

## 2018-05-17 DIAGNOSIS — M799 Soft tissue disorder, unspecified: Secondary | ICD-10-CM | POA: Diagnosis not present

## 2018-05-17 DIAGNOSIS — R911 Solitary pulmonary nodule: Secondary | ICD-10-CM | POA: Diagnosis not present

## 2018-05-17 MED ORDER — IOPAMIDOL (ISOVUE-300) INJECTION 61%
100.0000 mL | Freq: Once | INTRAVENOUS | Status: AC | PRN
  Administered 2018-05-17: 100 mL via INTRAVENOUS

## 2018-05-24 ENCOUNTER — Encounter (HOSPITAL_COMMUNITY): Payer: Self-pay | Admitting: Internal Medicine

## 2018-05-24 ENCOUNTER — Inpatient Hospital Stay (HOSPITAL_COMMUNITY): Payer: PPO | Attending: Internal Medicine | Admitting: Internal Medicine

## 2018-05-24 VITALS — BP 158/85 | HR 64 | Temp 98.0°F | Resp 18 | Wt 158.1 lb

## 2018-05-24 DIAGNOSIS — E039 Hypothyroidism, unspecified: Secondary | ICD-10-CM | POA: Diagnosis not present

## 2018-05-24 DIAGNOSIS — Z87891 Personal history of nicotine dependence: Secondary | ICD-10-CM | POA: Diagnosis not present

## 2018-05-24 DIAGNOSIS — T451X5S Adverse effect of antineoplastic and immunosuppressive drugs, sequela: Secondary | ICD-10-CM | POA: Diagnosis not present

## 2018-05-24 DIAGNOSIS — G620 Drug-induced polyneuropathy: Secondary | ICD-10-CM | POA: Diagnosis not present

## 2018-05-24 DIAGNOSIS — Z791 Long term (current) use of non-steroidal anti-inflammatories (NSAID): Secondary | ICD-10-CM | POA: Insufficient documentation

## 2018-05-24 DIAGNOSIS — Z90710 Acquired absence of both cervix and uterus: Secondary | ICD-10-CM | POA: Diagnosis not present

## 2018-05-24 DIAGNOSIS — C801 Malignant (primary) neoplasm, unspecified: Secondary | ICD-10-CM

## 2018-05-24 DIAGNOSIS — Z79899 Other long term (current) drug therapy: Secondary | ICD-10-CM | POA: Diagnosis not present

## 2018-05-24 DIAGNOSIS — Z9221 Personal history of antineoplastic chemotherapy: Secondary | ICD-10-CM | POA: Insufficient documentation

## 2018-05-24 DIAGNOSIS — Z800 Family history of malignant neoplasm of digestive organs: Secondary | ICD-10-CM | POA: Diagnosis not present

## 2018-05-24 DIAGNOSIS — Z8041 Family history of malignant neoplasm of ovary: Secondary | ICD-10-CM | POA: Diagnosis not present

## 2018-05-24 DIAGNOSIS — Z299 Encounter for prophylactic measures, unspecified: Secondary | ICD-10-CM | POA: Diagnosis not present

## 2018-05-24 DIAGNOSIS — R918 Other nonspecific abnormal finding of lung field: Secondary | ICD-10-CM | POA: Insufficient documentation

## 2018-05-24 DIAGNOSIS — F419 Anxiety disorder, unspecified: Secondary | ICD-10-CM | POA: Diagnosis not present

## 2018-05-24 DIAGNOSIS — C569 Malignant neoplasm of unspecified ovary: Secondary | ICD-10-CM

## 2018-05-24 DIAGNOSIS — Z90722 Acquired absence of ovaries, bilateral: Secondary | ICD-10-CM | POA: Diagnosis not present

## 2018-05-24 DIAGNOSIS — K219 Gastro-esophageal reflux disease without esophagitis: Secondary | ICD-10-CM | POA: Insufficient documentation

## 2018-05-24 DIAGNOSIS — E559 Vitamin D deficiency, unspecified: Secondary | ICD-10-CM | POA: Diagnosis not present

## 2018-05-24 DIAGNOSIS — Z6826 Body mass index (BMI) 26.0-26.9, adult: Secondary | ICD-10-CM | POA: Diagnosis not present

## 2018-05-24 NOTE — Patient Instructions (Signed)
[facility] at [facility] Discharge Instructions  You were seen by Dr. Higgs today   Thank you for choosing [facility] at [facility] to provide your oncology and hematology care.  To afford each patient quality time with our provider, please arrive at least 15 minutes before your scheduled appointment time.   If you have a lab appointment with the Cancer Center please come in thru the  Main Entrance and check in at the main information desk  You need to re-schedule your appointment should you arrive 10 or more minutes late.  We strive to give you quality time with our providers, and arriving late affects you and other patients whose appointments are after yours.  Also, if you no show three or more times for appointments you may be dismissed from the clinic at the providers discretion.     Again, thank you for choosing [facility].  Our hope is that these requests will decrease the amount of time that you wait before being seen by our physicians.       _____________________________________________________________  Should you have questions after your visit to [facility], please contact our office at [phone] between the hours of 8:00 a.m. and 4:30 p.m.  Voicemails left after 4:00 p.m. will not be returned until the following business day.  For prescription refill requests, have your pharmacy contact our office and allow 72 hours.    Cancer Center Support Programs:   > Cancer Support Group  2nd Tuesday of the month 1pm-2pm, Journey Room   

## 2018-05-24 NOTE — Progress Notes (Signed)
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  Assessment and Plan:  1.  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  She completed 11/12 planned cycles of FOLFOX secondary to prolonged cytopenias and neuropathy in spite of dose reductions and delays in therapy.  She denies any complaints today.  She denies any weight loss. She no abdominal pain or bloating.  CT chest abdomen and pelvis was done in December 2017 and showed no signs of recurrence.  She was given option of repeat imaging in 1 week but desired to wait until December for scan evaluation.    CT CAP done 05/17/2018 reviewed and showed  IMPRESSION: 1. No new or progressive findings to suggest recurrent or metastatic disease. 2. Nodular soft tissue along the left pelvic sidewall may represent vascular anatomy and is not substantially changed since prior study. Attention in this region on follow-up recommended to ensure stability. 3. Stable tiny bilateral pulmonary nodules.  Labs done 05/03/2018 reviewed and showed WBC 5.7 HB 12.6 plts 248,000.  Chemistries WNL with K+ 4.3 Cr 0.84 and normal LFTs.  CA 125 WNL at 13.7.  Recent CEA was 2.2.  Pt will be seen for follow-up in 11/2018 with labs and repeat tumor markers.  She is advised scans are stable with no findings to suggest recurrent or metastatic disease.  She is referred to Dr. Arnoldo Morale for port removal.    2  Pulmonary nodules.  These are small and are stable compared to prior imaging.  Pt has remote history of smoking.  She will be set up for repeat imaging in 05/2019.    3.  Chemotherapy induced neuropathy.  Suspect continued improvement over time.  Option of Neurontin or cymbalta.    4.  Anxiety.  Continue to follow-up with PCP for management.    5.   Port  Pt desires to have port removed  She is referred to Dr. Arnoldo Morale for port removal.    25 minutes spent with more than 50% spent in counseling and coordination of care.    Interval History:  Historical data obtained from note dated 08/24/2017.  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  She completed 11/12 planned cycles of FOLFOX secondary to prolonged cytopenias and neuropathy in spite of dose reductions and delays in therapy.  Current Status:  Pt is seen today for follow-up.  She is here to go over labs and scans.  She denies any abdominal symptoms and has noted no blood in stool or urine.  She desires to have port removed.       Mucinous adenocarcinoma ([place])   06/24/2015 Imaging    CT CAP- Complex cystic central pelvic mass remains suspicious for cystic ovarian carcinoma, most likely arising from the right ovary. Prominent right gonadal vasculature. Tiny hypodensity in the dome of the liver is too small to characterize, but stable.    06/26/2015 Pathology Results    Adnexa - ovary +/- tube, neoplastic, right - MUCINOUS ADENOCARCINOMA, 13 CM. - OVARIAN SURFACE NOT INVOLVED BY TUMOR. - UNREMARKABLE FALLOPIAN TUBE WITH NO ENDOMETRIOSIS OR MALIGNANCY.    06/26/2015 Procedure    Exploratory laparotomy with total hysterectomy bilateral salpingo-oophorectomy, pelvic and para-aortic lymph node dissection, omentectomy washings and biopsies for ovarian cancer by Dr. [name]    06/27/2015 Pathology Results    PERITONEAL WASHING(SPECIMEN 1 OF 1 COLLECTED 06/27/15):  ATYPICAL CELLS. Immunohistochemistry calretinin, cytokeratin 5/6, estrogen receptor, progesterone receptor and WT-1 is performed. The morphology and immunophenotype favor reactive mesothelial cells    07/10/2015 Initial Diagnosis    Mucinous adenocarcinoma ([place])    07/22/2015 -  Chemotherapy    FOLFOX    07/24/2015 Survivorship    Genetic Counseling consultation with [name].     07/26/2015 Adverse Reaction    Loose stools, 2-3 per 24 hours    07/29/2015 Treatment Plan Change    5FU bolus discontinued due to loose stools.    08/05/2015 Adverse Reaction    Reaction to Oxaliplatin    09/01/2015 Treatment Plan Change    Decrease 5FU CI by 10%    09/30/2015 Treatment Plan Change    Oxaliplatin reduced by 10%    12/02/2015 Treatment Plan Change    Treatement deferred x 1 week, thrombocytopenia at 85,000    05/17/2016 Imaging    CT scans :Status post total abdominal hysterectomy and bilateral salpingo-oophorectomy, with no evidence to suggest local recurrence of disease or metastatic disease in the abdomen or pelvis. Previously noted tiny volume of ascites and mildly enlarged right inguinal lymph node have both resolved. 2. Incidental findings, as above.      Problem List Patient Active Problem List   Diagnosis Date Noted  . Nausea without vomiting [R11.0] 09/16/2015  . Genetic testing [Z13.79] 08/18/2015  . Family history of ovarian cancer [Z80.41]   . Family history of pancreatic cancer [Z80.0]   . Family history of colon cancer [Z80.0]   . 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 Allergy   . Complication of anesthesia   . Family history of colon cancer   . Family history of ovarian cancer   . Family history of pancreatic cancer   . GERD (gastroesophageal reflux disease)   . Headache   . History of bronchitis   . Mucinous adenocarcinoma ([place]) 07/10/2015  . Pneumonia   . PONV (postoperative nausea and vomiting)   . Tinnitus   . Urinary urgency   . Vertigo   . Wears glasses     Past Surgical History Past Surgical History:  Procedure Laterality Date  . ABDOMINAL HYSTERECTOMY N/A 06/26/2015   Procedure: TOTAL HYSTERECTOMY ABDOMINAL;  Surgeon: [name], MD;  Location: [facility];  Service: Gynecology;  Laterality: N/A;  . APPENDECTOMY    . DEBULKING N/A 06/26/2015    Procedure: bilateral periaortic lymph node dissection biopsies and peritoneal washings;  Surgeon: [name], MD;  Location: [facility];  Service: Gynecology;  Laterality: N/A;  . DIAGNOSTIC LAPAROSCOPY  1977  . LAPAROTOMY N/A 06/26/2015   Procedure: EXPLORATORY LAPAROTOMY;  Surgeon: [name], MD;  Location: [facility];  Service: Gynecology;  Laterality: N/A;  . OMENTECTOMY N/A 06/26/2015   Procedure:  OMENTECTOMY;  Surgeon: [name], MD;  Location: [facility];  Service: Gynecology;  Laterality: N/A;  . PORTACATH PLACEMENT Left 07/18/2015   Procedure: INSERTION PORT-A-CATH;  Surgeon: Aviva Signs, MD;  Location: [facility];  Service: General;  Laterality: Left;  pt knows to arrive at 6:15  . rectal fusula  1980  . SALPINGOOPHORECTOMY Bilateral 06/26/2015   Procedure: SALPINGO OOPHORECTOMY;  Surgeon: [name], MD;  Location: [facility];  Service: Gynecology;  Laterality: Bilateral;  . TUBAL LIGATION  1974    Family History Family History  Problem Relation Age of Onset  . Cancer Father   . Colon polyps Father   . Ovarian  cancer Maternal Aunt   . Colon cancer Maternal Aunt   . Pancreatic cancer Maternal Uncle   . Colon polyps Mother   . Colon polyps Sister   . Cancer Paternal Aunt        NOS  . Heart defect Maternal Grandmother        hole in the heart  . COPD Maternal Grandfather   . Colon cancer Paternal Grandmother        dx possibly under 25  . Mental illness Sister   . Uterine cancer Maternal Aunt        dx possibly in her 22s  . Pancreatic cancer Maternal Aunt   . Cancer Paternal Aunt        NOS  . Cancer Cousin        adrenal cancer dx in his 13s; maternal cousin  . Ovarian cancer Cousin        dx in her 27s; maternal cousin  . Diabetes Daughter      Social History  reports that she quit smoking about 29 years ago. Her smoking use included cigarettes. She has a 19.00 pack-year smoking history. She has never used smokeless tobacco. She reports that she drinks alcohol. She  reports that she does not use drugs.  Medications  Current Outpatient Medications:  .  acetaminophen (TYLENOL) 500 MG tablet, Take 500 mg by mouth every 6 (six) hours as needed., Disp: , Rfl:  .  fluticasone (FLONASE) 50 MCG/ACT nasal spray, Place 1 spray into both nostrils daily., Disp: , Rfl:  .  ibuprofen (ADVIL,MOTRIN) 200 MG tablet, Take 400 mg by mouth every 6 (six) hours as needed for headache or moderate pain. Reported on 07/14/2015, Disp: , Rfl:  .  lidocaine-prilocaine (EMLA) cream, Apply a quarter size amount to port site 1 hour prior to chemo. Do not rub in. Cover with plastic wrap., Disp: 30 g, Rfl: 3 .  loratadine (CLARITIN) 10 MG tablet, Take 10 mg by mouth daily as needed for allergies., Disp: , Rfl:  .  LORazepam (ATIVAN) 0.5 MG tablet, Take 1 tablet (0.5 mg total) by mouth every 8 (eight) hours., Disp: 60 tablet, Rfl: 1 .  Papaya Enzyme CHEW, Chew 1 tablet by mouth daily. Reported on 10/28/2015, Disp: , Rfl:  No current facility-administered medications for this visit.   Facility-Administered Medications Ordered in Other Visits:  .  dexamethasone (DECADRON) 8 mg in sodium chloride 0.9 % 50 mL IVPB, 8 mg, Intravenous, Once, Kefalas, Thomas S, PA-C .  dexamethasone (DECADRON) 8 mg in sodium chloride 0.9 % 50 mL IVPB, 8 mg, Intravenous, Once, Kefalas, Thomas S, PA-C .  prochlorperazine (COMPAZINE) injection 10 mg, 10 mg, Intravenous, Once, Kefalas, Thomas S, PA-C .  prochlorperazine (COMPAZINE) injection 10 mg, 10 mg, Intravenous, Once, Kefalas, Thomas S, PA-C  Allergies Sulfur  Review of Systems Review of Systems - Oncology ROS negative other than neuropathy.     Physical Exam  Vitals Wt Readings from Last 3 Encounters:  05/24/18 158 lb 1.6 oz (71.7 kg)  08/24/17 151 lb 6.4 oz (68.7 kg)  03/08/17 143 lb (64.9 kg)   Temp Readings from Last 3 Encounters:  05/24/18 98 F (36.7 C) (Oral)  05/03/18 98.1 F (36.7 C) (Oral)  03/01/18 98.2 F (36.8 C) (Oral)   BP  Readings from Last 3 Encounters:  05/24/18 (!) 158/85  05/03/18 135/61  03/01/18 (!) 123/51   Pulse Readings from Last 3 Encounters:  05/24/18 64  05/03/18 65  03/01/18 67  Constitutional: Well-developed, well-nourished, and in no distress.   HENT: Head: Normocephalic and atraumatic.  Mouth/Throat: No oropharyngeal exudate. Mucosa moist. Eyes: Pupils are equal, round, and reactive to light. Conjunctivae are normal. No scleral icterus.  Neck: Normal range of motion. Neck supple. No JVD present.  Cardiovascular: Normal rate, regular rhythm and normal heart sounds.  Exam reveals no gallop and no friction rub.   No murmur heard. Pulmonary/Chest: Effort normal and breath sounds normal. No respiratory distress. No wheezes.No rales.  Abdominal: Soft. Bowel sounds are normal. No distension. There is no tenderness. There is no guarding.  Musculoskeletal: No edema or tenderness.  Lymphadenopathy: No cervical, axillary or supraclavicular adenopathy.  Neurological: Alert and oriented to person, place, and time. No cranial nerve deficit.  Skin: Skin is warm and dry. No rash noted. No erythema. No pallor.  Psychiatric: Affect and judgment normal.   Labs No visits with results within 3 Day(s) from this visit.  Latest known visit with results is:  Infusion on 05/03/2018  Component Date Value Ref Range Status  . Cancer Antigen (CA) 125 05/03/2018 13.7  0.0 - 38.1 U/mL Final   Comment: (NOTE) Roche Diagnostics Electrochemiluminescence Immunoassay (ECLIA) Values obtained with different assay methods or kits cannot be used interchangeably.  Results cannot be interpreted as absolute evidence of the presence or absence of malignant disease. Performed At: [facility] [place], [place] [id number] Rush Farmer MD RX:[id number]   . LDH 05/03/2018 146  98 - 192 U/L Final   Performed at [facility], [address], [place], [place] [address]  . Sodium 05/03/2018 140  135 - 145  mmol/L Final  . Potassium 05/03/2018 4.3  3.5 - 5.1 mmol/L Final  . Chloride 05/03/2018 105  98 - 111 mmol/L Final  . CO2 05/03/2018 26  22 - 32 mmol/L Final  . Glucose, Bld 05/03/2018 97  70 - 99 mg/dL Final  . BUN 05/03/2018 15  8 - 23 mg/dL Final  . Creatinine, Ser 05/03/2018 0.84  0.44 - 1.00 mg/dL Final  . Calcium 05/03/2018 9.8  8.9 - 10.3 mg/dL Final  . Total Protein 05/03/2018 7.2  6.5 - 8.1 g/dL Final  . Albumin 05/03/2018 4.5  3.5 - 5.0 g/dL Final  . AST 05/03/2018 18  15 - 41 U/L Final  . ALT 05/03/2018 20  0 - 44 U/L Final  . Alkaline Phosphatase 05/03/2018 57  38 - 126 U/L Final  . Total Bilirubin 05/03/2018 0.8  0.3 - 1.2 mg/dL Final  . GFR calc non Af Amer 05/03/2018 >60  >60 mL/min Final  . GFR calc Af Amer 05/03/2018 >60  >60 mL/min Final   Comment: (NOTE) The eGFR has been calculated using the CKD EPI equation. This calculation has not been validated in all clinical situations. eGFR's persistently <60 mL/min signify possible Chronic Kidney Disease.   Georgiann Hahn gap 05/03/2018 9  5 - 15 Final   Performed at [facility], [address], [place], [place] [address]  . WBC 05/03/2018 5.7  4.0 - 10.5 K/uL Final  . RBC 05/03/2018 3.94  3.87 - 5.11 MIL/uL Final  . Hemoglobin 05/03/2018 12.6  12.0 - 15.0 g/dL Final  . HCT 05/03/2018 38.9  36.0 - 46.0 % Final  . MCV 05/03/2018 98.7  80.0 - 100.0 fL Final  . MCH 05/03/2018 32.0  26.0 - 34.0 pg Final  . MCHC 05/03/2018 32.4  30.0 - 36.0 g/dL Final  . RDW 05/03/2018 12.3  11.5 - 15.5 %  Final  . Platelets 05/03/2018 248  150 - 400 K/uL Final  . nRBC 05/03/2018 0.0  0.0 - 0.2 % Final  . Neutrophils Relative % 05/03/2018 59  % Final  . Neutro Abs 05/03/2018 3.4  1.7 - 7.7 K/uL Final  . Lymphocytes Relative 05/03/2018 31  % Final  . Lymphs Abs 05/03/2018 1.8  0.7 - 4.0 K/uL Final  . Monocytes Relative 05/03/2018 8  % Final  . Monocytes Absolute 05/03/2018 0.5  0.1 - 1.0 K/uL Final  . Eosinophils Relative 05/03/2018 1  % Final   . Eosinophils Absolute 05/03/2018 0.1  0.0 - 0.5 K/uL Final  . Basophils Relative 05/03/2018 1  % Final  . Basophils Absolute 05/03/2018 0.0  0.0 - 0.1 K/uL Final  . Immature Granulocytes 05/03/2018 0  % Final  . Abs Immature Granulocytes 05/03/2018 0.02  0.00 - 0.07 K/uL Final   Performed at [facility], [address], [place], [place] [address]     Pathology Orders Placed This Encounter  Procedures  . CBC with Differential/Platelet    Standing Status:   Future    Standing Expiration Date:   05/24/2020  . Comprehensive metabolic panel    Standing Status:   Future    Standing Expiration Date:   05/24/2020  . Lactate dehydrogenase    Standing Status:   Future    Standing Expiration Date:   05/24/2020  . CEA    Standing Status:   Future    Standing Expiration Date:   05/24/2020  . CA 125    Standing Status:   Future    Standing Expiration Date:   05/24/2020       Zoila Shutter MD

## 2018-06-29 ENCOUNTER — Encounter: Payer: Self-pay | Admitting: General Surgery

## 2018-06-29 ENCOUNTER — Ambulatory Visit (INDEPENDENT_AMBULATORY_CARE_PROVIDER_SITE_OTHER): Payer: PPO | Admitting: General Surgery

## 2018-06-29 VITALS — BP 132/70 | HR 60 | Temp 97.3°F | Resp 18 | Wt 156.2 lb

## 2018-06-29 DIAGNOSIS — C801 Malignant (primary) neoplasm, unspecified: Secondary | ICD-10-CM | POA: Diagnosis not present

## 2018-06-29 NOTE — H&P (Signed)
Natalie Ball; [id number]; [birth year]   HPI Patient is referred to my care by Dr. Brigitte Pulse and oncology for Port-A-Cath removal.  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  She currently has no pain over the port site. Past Medical History:  Diagnosis Date  . Allergy   . Complication of anesthesia   . Family history of colon cancer   . Family history of ovarian cancer   . Family history of pancreatic cancer   . GERD (gastroesophageal reflux disease)   . Headache   . History of bronchitis   . 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 Pneumonia   . PONV (postoperative nausea and vomiting)   . Tinnitus   . Urinary urgency   . Vertigo   . Wears glasses     Past Surgical History:  Procedure Laterality Date  . ABDOMINAL HYSTERECTOMY N/A 06/26/2015   Procedure: TOTAL HYSTERECTOMY ABDOMINAL;  Surgeon: [name], MD;  Location: [facility];  Service: Gynecology;  Laterality: N/A;  . APPENDECTOMY    . DEBULKING N/A 06/26/2015   Procedure: bilateral periaortic lymph node dissection biopsies and peritoneal washings;  Surgeon: [name], MD;  Location: [facility];  Service: Gynecology;  Laterality: N/A;  . DIAGNOSTIC LAPAROSCOPY  1977  . LAPAROTOMY N/A 06/26/2015   Procedure: EXPLORATORY LAPAROTOMY;  Surgeon: [name], MD;  Location: [facility];  Service: Gynecology;  Laterality: N/A;  . OMENTECTOMY N/A 06/26/2015   Procedure:  OMENTECTOMY;  Surgeon: [name], MD;  Location: [facility];  Service: Gynecology;  Laterality: N/A;  . PORTACATH PLACEMENT Left 07/18/2015   Procedure: INSERTION PORT-A-CATH;  Surgeon: Aviva Signs, MD;  Location: [facility];  Service: General;  Laterality: Left;  pt knows to arrive at 6:15  . rectal fusula  1980  . SALPINGOOPHORECTOMY Bilateral 06/26/2015   Procedure: SALPINGO OOPHORECTOMY;  Surgeon: [name], MD;  Location: [facility];  Service: Gynecology;  Laterality: Bilateral;  . TUBAL LIGATION  1974    Family History  Problem  Relation Age of Onset  . Cancer Father   . Colon polyps Father   . Ovarian cancer Maternal Aunt   . Colon cancer Maternal Aunt   . Pancreatic cancer Maternal Uncle   . Colon polyps Mother   . Colon polyps Sister   . Cancer Paternal Aunt        NOS  . Heart defect Maternal Grandmother        hole in the heart  . COPD Maternal Grandfather   . Colon cancer Paternal Grandmother        dx possibly under 19  . Mental illness Sister   . Uterine cancer Maternal Aunt        dx possibly in her 41s  . Pancreatic cancer Maternal Aunt   . Cancer Paternal Aunt        NOS  . Cancer Cousin        adrenal cancer dx in his 34s; maternal cousin  . Ovarian cancer Cousin        dx in her 57s; maternal cousin  . Diabetes Daughter     Current Outpatient Medications on File Prior to Visit  Medication Sig Dispense Refill  . acetaminophen (TYLENOL) 500 MG tablet Take 500 mg by mouth every 6 (six) hours as needed.    . fluticasone (FLONASE) 50 MCG/ACT nasal spray Place 1 spray into both nostrils daily.    Marland Kitchen ibuprofen (ADVIL,MOTRIN) 200 MG tablet Take 400 mg by mouth every 6 (six) hours  as needed for headache or moderate pain. Reported on 07/14/2015    . lidocaine-prilocaine (EMLA) cream Apply a quarter size amount to port site 1 hour prior to chemo. Do not rub in. Cover with plastic wrap. 30 g 3  . loratadine (CLARITIN) 10 MG tablet Take 10 mg by mouth daily as needed for allergies.    Marland Kitchen LORazepam (ATIVAN) 0.5 MG tablet Take 1 tablet (0.5 mg total) by mouth every 8 (eight) hours. 60 tablet 1  . Papaya Enzyme CHEW Chew 1 tablet by mouth daily. Reported on 10/28/2015     Current Facility-Administered Medications on File Prior to Visit  Medication Dose Route Frequency Provider Last Rate Last Dose  . dexamethasone (DECADRON) 8 mg in sodium chloride 0.9 % 50 mL IVPB  8 mg Intravenous Once Kefalas, Thomas S, PA-C      . dexamethasone (DECADRON) 8 mg in sodium chloride 0.9 % 50 mL IVPB  8 mg Intravenous Once  Kefalas, Thomas S, PA-C      . prochlorperazine (COMPAZINE) injection 10 mg  10 mg Intravenous Once Kefalas, Thomas S, PA-C      . prochlorperazine (COMPAZINE) injection 10 mg  10 mg Intravenous Once Kefalas, Thomas S, PA-C        Allergies  Allergen Reactions  . Sulfur Other (See Comments)    Severe pain in side and couldn't hardly walk & then started vomiting after taking    Social History   Substance and Sexual Activity  Alcohol Use Yes   Comment: occasionally     Social History   Tobacco Use  Smoking Status Former Smoker  . Packs/day: 1.00  . Years: 19.00  . Pack years: 19.00  . Types: Cigarettes  . Last attempt to quit: 06/14/1988  . Years since quitting: 30.0  Smokeless Tobacco Never Used    Review of Systems  Constitutional: Negative.   HENT: Negative.   Eyes: Negative.   Respiratory: Negative.   Cardiovascular: Negative.   Gastrointestinal: Negative.   Genitourinary: Negative.   Musculoskeletal: Negative.   Skin: Negative.   Neurological: Negative.   Endo/Heme/Allergies: Negative.   Psychiatric/Behavioral: Negative.     Objective   Vitals:   06/29/18 0852  BP: 132/70  Pulse: 60  Resp: 18  Temp: (!) 97.3 F (36.3 C)    Physical Exam Vitals signs reviewed.  Constitutional:      Appearance: Normal appearance. She is not ill-appearing.  HENT:     Head: Normocephalic and atraumatic.  Cardiovascular:     Rate and Rhythm: Normal rate and regular rhythm.     Heart sounds: Normal heart sounds. No murmur. No gallop.   Pulmonary:     Effort: Pulmonary effort is normal. No respiratory distress.     Breath sounds: Normal breath sounds. No stridor. No wheezing, rhonchi or rales.     Comments: Port-A-Cath in place in the left upper chest. Skin:    General: Skin is warm and dry.  Neurological:     Mental Status: She is alert and oriented to person, place, and time.   Oncology notes reviewed  Assessment  Mucinous adenocarcinoma, finished with  chemotherapy Plan   Patient is scheduled for Port-A-Cath removal in the minor procedure room on 07/03/2018.  The risks and benefits of the procedure including bleeding and infection were fully explained to the patient, who gave informed consent.

## 2018-06-29 NOTE — Progress Notes (Signed)
Natalie Ball; [id number]; 05/22/1951   HPI Patient is referred to my care by Dr. Brigitte Pulse and oncology for Port-A-Cath removal.  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  She currently has no pain over the port site. Past Medical History:  Diagnosis Date  . Allergy   . Complication of anesthesia   . Family history of colon cancer   . Family history of ovarian cancer   . Family history of pancreatic cancer   . GERD (gastroesophageal reflux disease)   . Headache   . History of bronchitis   . 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 Pneumonia   . PONV (postoperative nausea and vomiting)   . Tinnitus   . Urinary urgency   . Vertigo   . Wears glasses     Past Surgical History:  Procedure Laterality Date  . ABDOMINAL HYSTERECTOMY N/A 06/26/2015   Procedure: TOTAL HYSTERECTOMY ABDOMINAL;  Surgeon: [name], MD;  Location: [facility];  Service: Gynecology;  Laterality: N/A;  . APPENDECTOMY    . DEBULKING N/A 06/26/2015   Procedure: bilateral periaortic lymph node dissection biopsies and peritoneal washings;  Surgeon: [name], MD;  Location: [facility];  Service: Gynecology;  Laterality: N/A;  . DIAGNOSTIC LAPAROSCOPY  1977  . LAPAROTOMY N/A 06/26/2015   Procedure: EXPLORATORY LAPAROTOMY;  Surgeon: [name], MD;  Location: [facility];  Service: Gynecology;  Laterality: N/A;  . OMENTECTOMY N/A 06/26/2015   Procedure:  OMENTECTOMY;  Surgeon: [name], MD;  Location: [facility];  Service: Gynecology;  Laterality: N/A;  . PORTACATH PLACEMENT Left 07/18/2015   Procedure: INSERTION PORT-A-CATH;  Surgeon: Aviva Signs, MD;  Location: [facility];  Service: General;  Laterality: Left;  pt knows to arrive at 6:15  . rectal fusula  1980  . SALPINGOOPHORECTOMY Bilateral 06/26/2015   Procedure: SALPINGO OOPHORECTOMY;  Surgeon: [name], MD;  Location: [facility];  Service: Gynecology;  Laterality: Bilateral;  . TUBAL LIGATION  1974    Family History  Problem  Relation Age of Onset  . Cancer Father   . Colon polyps Father   . Ovarian cancer Maternal Aunt   . Colon cancer Maternal Aunt   . Pancreatic cancer Maternal Uncle   . Colon polyps Mother   . Colon polyps Sister   . Cancer Paternal Aunt        NOS  . Heart defect Maternal Grandmother        hole in the heart  . COPD Maternal Grandfather   . Colon cancer Paternal Grandmother        dx possibly under 36  . Mental illness Sister   . Uterine cancer Maternal Aunt        dx possibly in her 20s  . Pancreatic cancer Maternal Aunt   . Cancer Paternal Aunt        NOS  . Cancer Cousin        adrenal cancer dx in his 16s; maternal cousin  . Ovarian cancer Cousin        dx in her 29s; maternal cousin  . Diabetes Daughter     Current Outpatient Medications on File Prior to Visit  Medication Sig Dispense Refill  . acetaminophen (TYLENOL) 500 MG tablet Take 500 mg by mouth every 6 (six) hours as needed.    . fluticasone (FLONASE) 50 MCG/ACT nasal spray Place 1 spray into both nostrils daily.    Marland Kitchen ibuprofen (ADVIL,MOTRIN) 200 MG tablet Take 400 mg by mouth every 6 (six) hours  as needed for headache or moderate pain. Reported on 07/14/2015    . lidocaine-prilocaine (EMLA) cream Apply a quarter size amount to port site 1 hour prior to chemo. Do not rub in. Cover with plastic wrap. 30 g 3  . loratadine (CLARITIN) 10 MG tablet Take 10 mg by mouth daily as needed for allergies.    Marland Kitchen LORazepam (ATIVAN) 0.5 MG tablet Take 1 tablet (0.5 mg total) by mouth every 8 (eight) hours. 60 tablet 1  . Papaya Enzyme CHEW Chew 1 tablet by mouth daily. Reported on 10/28/2015     Current Facility-Administered Medications on File Prior to Visit  Medication Dose Route Frequency Provider Last Rate Last Dose  . dexamethasone (DECADRON) 8 mg in sodium chloride 0.9 % 50 mL IVPB  8 mg Intravenous Once Kefalas, Thomas S, PA-C      . dexamethasone (DECADRON) 8 mg in sodium chloride 0.9 % 50 mL IVPB  8 mg Intravenous Once  Kefalas, Thomas S, PA-C      . prochlorperazine (COMPAZINE) injection 10 mg  10 mg Intravenous Once Kefalas, Thomas S, PA-C      . prochlorperazine (COMPAZINE) injection 10 mg  10 mg Intravenous Once Kefalas, Thomas S, PA-C        Allergies  Allergen Reactions  . Sulfur Other (See Comments)    Severe pain in side and couldn't hardly walk & then started vomiting after taking    Social History   Substance and Sexual Activity  Alcohol Use Yes   Comment: occasionally     Social History   Tobacco Use  Smoking Status Former Smoker  . Packs/day: 1.00  . Years: 19.00  . Pack years: 19.00  . Types: Cigarettes  . Last attempt to quit: 06/14/1988  . Years since quitting: 30.0  Smokeless Tobacco Never Used    Review of Systems  Constitutional: Negative.   HENT: Negative.   Eyes: Negative.   Respiratory: Negative.   Cardiovascular: Negative.   Gastrointestinal: Negative.   Genitourinary: Negative.   Musculoskeletal: Negative.   Skin: Negative.   Neurological: Negative.   Endo/Heme/Allergies: Negative.   Psychiatric/Behavioral: Negative.     Objective   Vitals:   06/29/18 0852  BP: 132/70  Pulse: 60  Resp: 18  Temp: (!) 97.3 F (36.3 C)    Physical Exam Vitals signs reviewed.  Constitutional:      Appearance: Normal appearance. She is not ill-appearing.  HENT:     Head: Normocephalic and atraumatic.  Cardiovascular:     Rate and Rhythm: Normal rate and regular rhythm.     Heart sounds: Normal heart sounds. No murmur. No gallop.   Pulmonary:     Effort: Pulmonary effort is normal. No respiratory distress.     Breath sounds: Normal breath sounds. No stridor. No wheezing, rhonchi or rales.     Comments: Port-A-Cath in place in the left upper chest. Skin:    General: Skin is warm and dry.  Neurological:     Mental Status: She is alert and oriented to person, place, and time.   Oncology notes reviewed  Assessment  Mucinous adenocarcinoma, finished with  chemotherapy Plan   Patient is scheduled for Port-A-Cath removal in the minor procedure room on 07/03/2018.  The risks and benefits of the procedure including bleeding and infection were fully explained to the patient, who gave informed consent.

## 2018-06-29 NOTE — Patient Instructions (Signed)
Implanted Port Removal  Implanted port removal is a procedure to remove the port and catheter that are implanted under your skin. The port is a small disc under your skin that can be punctured with a needle. It is connected to a vein in your chest or neck by a small flexible tube (catheter). The implanted port is used to give medicines for treatments, and it may also be used to take blood samples. Your health care provider will remove the implanted port if:  You no longer need it for treatment.  It is not working properly.  The area around it gets infected. Tell a health care provider about:  Any allergies you have.  All medicines you are taking, including vitamins, herbs, eye drops, creams, and over-the-counter medicines.  Any problems you or family members have had with anesthetic medicines.  Any blood disorders you have.  Any surgeries you have had.  Any medical conditions you have.  Whether you are pregnant or may be pregnant. What are the risks? Generally, this is a safe procedure. However, problems may occur, including:  Infection.  Bleeding.  Allergic reactions to anesthetic medicines.  Damage to nerves or blood vessels. What happens before the procedure? Medicines  Ask your health care provider about: ? Changing or stopping your regular medicines. This is especially important if you are taking diabetes medicines or blood thinners. ? Taking medicines such as aspirin and ibuprofen. These medicines can thin your blood. Do not take these medicines unless your health care provider tells you to take them. ? Taking over-the-counter medicines, vitamins, herbs, and supplements. General instructions   What happens during the procedure?  You may be given one or more of the following: ? A medicine to help you relax (sedative). ? A medicine to numb the area (local anesthetic).  A small incision will be made at the site of your implanted port.  The implanted port and  the catheter that has been inside your vein will be gently removed.  The port and catheter will be inspected to make sure that all the parts have been removed. Part of the catheter may be tested for bacteria.  The incision will be closed with stitches (sutures), adhesive strips, or skin glue.  A bandage (dressing) will be placed over the incision. The health care provider may apply gentle pressure over the dressing for about 5 minutes. The procedure may vary among health care providers and hospitals. What happens after the procedure?  Your blood pressure, heart rate, breathing rate, and blood oxygen level will be monitored until you leave the hospital or clinic.  You will be monitored to make sure that there is no bleeding from the site where the port was removed.  Do not drive for 24 hours if you were given a sedative during your procedure. Summary  Implanted port removal is a procedure to remove the port and catheter that are implanted under your skin.  Before the procedure, follow your health care provider's instructions about changing or stopping your regular medicines. This is especially important if you are taking diabetes medicines or blood thinners.  If you will be going home right after the procedure, plan to have a responsible adult care for you for at least 24 hours after you leave the hospital or clinic. This information is not intended to replace advice given to you by your health care provider. Make sure you discuss any questions you have with your health care provider. Document Released: 05/12/2015 Document Revised: 07/14/2017 Document  Reviewed: 07/14/2017 Elsevier Interactive Patient Education  [employer].

## 2018-07-03 ENCOUNTER — Encounter (HOSPITAL_COMMUNITY)
Admission: RE | Disposition: A | Discharge status: Home / Self Care | Payer: Self-pay | Source: Home / Self Care | Attending: General Surgery

## 2018-07-03 ENCOUNTER — Ambulatory Visit (HOSPITAL_COMMUNITY)
Admission: RE | Admit: 2018-07-03 | Discharge: 2018-07-03 | Disposition: A | Discharge status: Home / Self Care | Payer: PPO | Attending: General Surgery | Admitting: General Surgery

## 2018-07-03 ENCOUNTER — Encounter (HOSPITAL_COMMUNITY): Payer: Self-pay | Admitting: *Deleted

## 2018-07-03 DIAGNOSIS — Z452 Encounter for adjustment and management of vascular access device: Secondary | ICD-10-CM | POA: Insufficient documentation

## 2018-07-03 DIAGNOSIS — F17210 Nicotine dependence, cigarettes, uncomplicated: Secondary | ICD-10-CM | POA: Diagnosis not present

## 2018-07-03 DIAGNOSIS — C801 Malignant (primary) neoplasm, unspecified: Secondary | ICD-10-CM | POA: Insufficient documentation

## 2018-07-03 DIAGNOSIS — Z79899 Other long term (current) drug therapy: Secondary | ICD-10-CM | POA: Diagnosis not present

## 2018-07-03 DIAGNOSIS — Z9221 Personal history of antineoplastic chemotherapy: Secondary | ICD-10-CM | POA: Insufficient documentation

## 2018-07-03 DIAGNOSIS — Z882 Allergy status to sulfonamides status: Secondary | ICD-10-CM | POA: Insufficient documentation

## 2018-07-03 HISTORY — PX: PORT-A-CATH REMOVAL: SHX5289

## 2018-07-03 SURGERY — MINOR REMOVAL PORT-A-CATH
Anesthesia: LOCAL

## 2018-07-03 MED ORDER — LIDOCAINE HCL (PF) 1 % IJ SOLN
INTRAMUSCULAR | Status: AC
  Filled 2018-07-03: qty 30

## 2018-07-03 MED ORDER — LIDOCAINE HCL (PF) 1 % IJ SOLN
INTRAMUSCULAR | Status: DC | PRN
  Administered 2018-07-03: 2 mL

## 2018-07-03 SURGICAL SUPPLY — 21 items
CHLORAPREP W/TINT 10.5 ML (MISCELLANEOUS) ×2 IMPLANT
CLOTH BEACON ORANGE TIMEOUT ST (SAFETY) ×2 IMPLANT
DECANTER SPIKE VIAL GLASS SM (MISCELLANEOUS) ×2 IMPLANT
DERMABOND ADVANCED (GAUZE/BANDAGES/DRESSINGS) ×1
DERMABOND ADVANCED .7 DNX12 (GAUZE/BANDAGES/DRESSINGS) ×1 IMPLANT
DRAPE PROXIMA HALF (DRAPES) ×2 IMPLANT
ELECT REM PT RETURN 9FT ADLT (ELECTROSURGICAL) ×2
ELECTRODE REM PT RTRN 9FT ADLT (ELECTROSURGICAL) ×1 IMPLANT
GLOVE BIOGEL PI IND STRL 7.0 (GLOVE) ×1 IMPLANT
GLOVE BIOGEL PI INDICATOR 7.0 (GLOVE) ×1
GLOVE SURG SS PI 7.5 STRL IVOR (GLOVE) ×2 IMPLANT
GOWN STRL REUS W/TWL LRG LVL3 (GOWN DISPOSABLE) ×2 IMPLANT
NDL HYPO 25X1 1.5 SAFETY (NEEDLE) ×1 IMPLANT
NEEDLE HYPO 25X1 1.5 SAFETY (NEEDLE) ×2 IMPLANT
PENCIL HANDSWITCHING (ELECTRODE) IMPLANT
SPONGE GAUZE 2X2 8PLY STRL LF (GAUZE/BANDAGES/DRESSINGS) IMPLANT
SUT MNCRL AB 4-0 PS2 18 (SUTURE) IMPLANT
SUT VIC AB 3-0 SH 27 (SUTURE) ×1
SUT VIC AB 3-0 SH 27X BRD (SUTURE) ×1 IMPLANT
SYR CONTROL 10ML LL (SYRINGE) ×2 IMPLANT
TOWEL OR 17X26 4PK STRL BLUE (TOWEL DISPOSABLE) ×2 IMPLANT

## 2018-07-03 NOTE — Discharge Instructions (Signed)
Implanted Port Removal, Care After  This sheet gives you information about how to care for yourself after your procedure. Your health care provider may also give you more specific instructions. If you have problems or questions, contact your health care provider.  What can I expect after the procedure?  After the procedure, it is common to have:   Soreness or pain near your incision.   Some swelling or bruising near your incision.  Follow these instructions at home:  Medicines   Take over-the-counter and prescription medicines only as told by your health care provider.   If you were prescribed an antibiotic medicine, take it as told by your health care provider. Do not stop taking the antibiotic even if you start to feel better.  Bathing   Do not take baths, swim, or use a hot tub until your health care provider approves. Ask your health care provider if you can take showers. You may only be allowed to take sponge baths.  Incision care     Follow instructions from your health care provider about how to take care of your incision. Make sure you:  ? Wash your hands with soap and water before you change your bandage (dressing). If soap and water are not available, use hand sanitizer.  ? Change your dressing as told by your health care provider.  ? Keep your dressing dry.  ? Leave stitches (sutures), skin glue, or adhesive strips in place. These skin closures may need to stay in place for 2 weeks or longer. If adhesive strip edges start to loosen and curl up, you may trim the loose edges. Do not remove adhesive strips completely unless your health care provider tells you to do that.   Check your incision area every day for signs of infection. Check for:  ? More redness, swelling, or pain.  ? More fluid or blood.  ? Warmth.  ? Pus or a bad smell.  Driving     Do not drive for 24 hours if you were given a medicine to help you relax (sedative) during your procedure.   If you did not receive a sedative, ask  your health care provider when it is safe to drive.  Activity   Return to your normal activities as told by your health care provider. Ask your health care provider what activities are safe for you.   Do not lift anything that is heavier than 10 lb (4.5 kg), or the limit that you are told, until your health care provider says that it is safe.   Do not do activities that involve lifting your arms over your head.  General instructions   Do not use any products that contain nicotine or tobacco, such as cigarettes and e-cigarettes. These can delay healing. If you need help quitting, ask your health care provider.   Keep all follow-up visits as told by your health care provider. This is important.  Contact a health care provider if:   You have more redness, swelling, or pain around your incision.   You have more fluid or blood coming from your incision.   Your incision feels warm to the touch.   You have pus or a bad smell coming from your incision.   You have pain that is not relieved by your pain medicine.  Get help right away if you have:   A fever or chills.   Chest pain.   Difficulty breathing.  Summary   After the procedure, it is common to   have pain, soreness, swelling, or bruising near your incision.   If you were prescribed an antibiotic medicine, take it as told by your health care provider. Do not stop taking the antibiotic even if you start to feel better.   Do not drive for 24 hours if you were given a sedative during your procedure.   Return to your normal activities as told by your health care provider. Ask your health care provider what activities are safe for you.  This information is not intended to replace advice given to you by your health care provider. Make sure you discuss any questions you have with your health care provider.  Document Released: 05/12/2015 Document Revised: 07/14/2017 Document Reviewed: 07/14/2017  Elsevier Interactive Patient Education  2019 [employer]

## 2018-07-03 NOTE — Interval H&P Note (Signed)
History and Physical Interval Note:  07/03/2018 8:34 AM  Natalie Ball  has presented today for surgery, with the diagnosis of adenocarcinoma  The various methods of treatment have been discussed with the patient and family. After consideration of risks, benefits and other options for treatment, the patient has consented to  Procedure(s): MINOR REMOVAL PORT-A-CATH (N/A) as a surgical intervention .  The patient's history has been reviewed, patient examined, no change in status, stable for surgery.  I have reviewed the patient's chart and labs.  Questions were answered to the patient's satisfaction.     Aviva Signs

## 2018-07-03 NOTE — Op Note (Signed)
Patient:  Natalie Ball  DOB:  [birth year]  MRN:  [id number]   Preop Diagnosis: Mucinous adenocarcinoma, finished with chemotherapy  Postop Diagnosis: Same  Procedure: Port-A-Cath removal  Surgeon: Aviva Signs, MD  Anes: Local  Indications: Patient is a 68 year old white female who has finished her chemotherapy for mucinous adenocarcinoma.  The risks and benefits of the procedure including bleeding and infection were fully explained to the patient, who gave informed consent.  Procedure note: The patient was placed in supine position.  The left upper chest was prepped and draped using usual sterile technique with DuraPrep.  Surgical site confirmation was performed.  1% Xylocaine was used for local anesthesia.  An incision was made through the previous surgical incision site.  This was taken down to the port.  The port was removed in total without difficulty.  It was disposed of.  The subcutaneous layer was reapproximated using a 3-0 Vicryl interrupted suture.  The skin was closed using a 4-0 Monocryl subcuticular suture.  Dermabond was applied.  All tape and needle counts were correct at the end of the procedure.  The patient was discharged from the minor procedure room in good and stable condition.  Complications: None  EBL: None  Specimen: None

## 2018-07-04 ENCOUNTER — Encounter (HOSPITAL_COMMUNITY): Payer: Self-pay | Admitting: General Surgery

## 2018-07-04 MED ORDER — PROPOFOL 10 MG/ML IV BOLUS
INTRAVENOUS | Status: AC
  Filled 2018-07-04: qty 20

## 2018-07-19 DIAGNOSIS — S134XXA Sprain of ligaments of cervical spine, initial encounter: Secondary | ICD-10-CM | POA: Diagnosis not present

## 2018-07-19 DIAGNOSIS — M9901 Segmental and somatic dysfunction of cervical region: Secondary | ICD-10-CM | POA: Diagnosis not present

## 2018-08-16 DIAGNOSIS — M9901 Segmental and somatic dysfunction of cervical region: Secondary | ICD-10-CM | POA: Diagnosis not present

## 2018-08-16 DIAGNOSIS — S134XXA Sprain of ligaments of cervical spine, initial encounter: Secondary | ICD-10-CM | POA: Diagnosis not present

## 2018-11-29 ENCOUNTER — Inpatient Hospital Stay (HOSPITAL_COMMUNITY): Payer: PPO | Attending: Hematology

## 2018-11-29 ENCOUNTER — Other Ambulatory Visit: Payer: Self-pay

## 2018-11-29 DIAGNOSIS — C561 Malignant neoplasm of right ovary: Secondary | ICD-10-CM | POA: Insufficient documentation

## 2018-11-29 DIAGNOSIS — G629 Polyneuropathy, unspecified: Secondary | ICD-10-CM | POA: Diagnosis not present

## 2018-11-29 DIAGNOSIS — Z90710 Acquired absence of both cervix and uterus: Secondary | ICD-10-CM | POA: Insufficient documentation

## 2018-11-29 DIAGNOSIS — Z79899 Other long term (current) drug therapy: Secondary | ICD-10-CM | POA: Insufficient documentation

## 2018-11-29 DIAGNOSIS — Z7951 Long term (current) use of inhaled steroids: Secondary | ICD-10-CM | POA: Insufficient documentation

## 2018-11-29 DIAGNOSIS — Z87891 Personal history of nicotine dependence: Secondary | ICD-10-CM | POA: Insufficient documentation

## 2018-11-29 DIAGNOSIS — Z791 Long term (current) use of non-steroidal anti-inflammatories (NSAID): Secondary | ICD-10-CM | POA: Insufficient documentation

## 2018-11-29 DIAGNOSIS — Z8041 Family history of malignant neoplasm of ovary: Secondary | ICD-10-CM | POA: Diagnosis not present

## 2018-11-29 DIAGNOSIS — C801 Malignant (primary) neoplasm, unspecified: Secondary | ICD-10-CM

## 2018-11-29 DIAGNOSIS — Z800 Family history of malignant neoplasm of digestive organs: Secondary | ICD-10-CM | POA: Insufficient documentation

## 2018-11-29 LAB — CBC WITH DIFFERENTIAL/PLATELET
Abs Immature Granulocytes: 0.01 10*3/uL (ref 0.00–0.07)
Basophils Absolute: 0 10*3/uL (ref 0.0–0.1)
Basophils Relative: 1 %
Eosinophils Absolute: 0.1 10*3/uL (ref 0.0–0.5)
Eosinophils Relative: 2 %
HCT: 39.3 % (ref 36.0–46.0)
Hemoglobin: 12.8 g/dL (ref 12.0–15.0)
Immature Granulocytes: 0 %
Lymphocytes Relative: 28 %
Lymphs Abs: 1.5 10*3/uL (ref 0.7–4.0)
MCH: 32.5 pg (ref 26.0–34.0)
MCHC: 32.6 g/dL (ref 30.0–36.0)
MCV: 99.7 fL (ref 80.0–100.0)
Monocytes Absolute: 0.4 10*3/uL (ref 0.1–1.0)
Monocytes Relative: 7 %
Neutro Abs: 3.4 10*3/uL (ref 1.7–7.7)
Neutrophils Relative %: 62 %
Platelets: 235 10*3/uL (ref 150–400)
RBC: 3.94 MIL/uL (ref 3.87–5.11)
RDW: 12.9 % (ref 11.5–15.5)
WBC: 5.5 10*3/uL (ref 4.0–10.5)
nRBC: 0 % (ref 0.0–0.2)

## 2018-11-29 LAB — COMPREHENSIVE METABOLIC PANEL
ALT: 20 U/L (ref 0–44)
AST: 19 U/L (ref 15–41)
Albumin: 4.3 g/dL (ref 3.5–5.0)
Alkaline Phosphatase: 61 U/L (ref 38–126)
Anion gap: 9 (ref 5–15)
BUN: 20 mg/dL (ref 8–23)
CO2: 26 mmol/L (ref 22–32)
Calcium: 9.3 mg/dL (ref 8.9–10.3)
Chloride: 104 mmol/L (ref 98–111)
Creatinine, Ser: 0.88 mg/dL (ref 0.44–1.00)
GFR calc Af Amer: >60 mL/min (ref >60)
GFR calc non Af Amer: >60 mL/min (ref >60)
Glucose, Bld: 97 mg/dL (ref 70–99)
Potassium: 4.1 mmol/L (ref 3.5–5.1)
Sodium: 139 mmol/L (ref 135–145)
Total Bilirubin: 0.8 mg/dL (ref 0.3–1.2)
Total Protein: 7.1 g/dL (ref 6.5–8.1)

## 2018-11-29 LAB — LACTATE DEHYDROGENASE: LDH: 132 U/L (ref 98–192)

## 2018-11-29 NOTE — Progress Notes (Signed)
For review.  Please update ordering provider

## 2018-11-30 LAB — CA 125: Cancer Antigen (CA) 125: 12.5 U/mL (ref 0.0–38.1)

## 2018-11-30 LAB — CEA: CEA: 1.9 ng/mL (ref 0.0–4.7)

## 2018-11-30 NOTE — Progress Notes (Signed)
For review.  Please update ordering provider

## 2018-12-05 ENCOUNTER — Other Ambulatory Visit: Payer: Self-pay

## 2018-12-06 ENCOUNTER — Inpatient Hospital Stay (HOSPITAL_BASED_OUTPATIENT_CLINIC_OR_DEPARTMENT_OTHER): Payer: PPO | Admitting: Hematology

## 2018-12-06 ENCOUNTER — Encounter (HOSPITAL_COMMUNITY): Payer: Self-pay | Admitting: Hematology

## 2018-12-06 VITALS — BP 125/66 | HR 63 | Temp 98.3°F | Resp 16 | Wt 160.5 lb

## 2018-12-06 DIAGNOSIS — Z8041 Family history of malignant neoplasm of ovary: Secondary | ICD-10-CM

## 2018-12-06 DIAGNOSIS — Z7951 Long term (current) use of inhaled steroids: Secondary | ICD-10-CM | POA: Diagnosis not present

## 2018-12-06 DIAGNOSIS — Z791 Long term (current) use of non-steroidal anti-inflammatories (NSAID): Secondary | ICD-10-CM | POA: Diagnosis not present

## 2018-12-06 DIAGNOSIS — Z800 Family history of malignant neoplasm of digestive organs: Secondary | ICD-10-CM | POA: Diagnosis not present

## 2018-12-06 DIAGNOSIS — Z79899 Other long term (current) drug therapy: Secondary | ICD-10-CM

## 2018-12-06 DIAGNOSIS — G629 Polyneuropathy, unspecified: Secondary | ICD-10-CM

## 2018-12-06 DIAGNOSIS — Z87891 Personal history of nicotine dependence: Secondary | ICD-10-CM | POA: Diagnosis not present

## 2018-12-06 DIAGNOSIS — Z90710 Acquired absence of both cervix and uterus: Secondary | ICD-10-CM

## 2018-12-06 DIAGNOSIS — C561 Malignant neoplasm of right ovary: Secondary | ICD-10-CM

## 2018-12-06 DIAGNOSIS — C801 Malignant (primary) neoplasm, unspecified: Secondary | ICD-10-CM

## 2018-12-06 NOTE — Progress Notes (Signed)
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 Prominent right gonadal vasculature. Tiny hypodensity in the dome of the liver is too small to characterize, but stable.   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 - OVARIAN SURFACE NOT INVOLVED BY TUMOR. - UNREMARKABLE FALLOPIAN TUBE WITH NO ENDOMETRIOSIS OR MALIGNANCY.   06/26/2015 Procedure   Exploratory laparotomy with total hysterectomy bilateral salpingo-oophorectomy, pelvic and para-aortic lymph node dissection, omentectomy washings and biopsies for ovarian cancer by Dr. [name]   06/27/2015 Pathology Results   PERITONEAL WASHING(SPECIMEN 1 OF 1 COLLECTED 06/27/15): ATYPICAL CELLS. Immunohistochemistry calretinin, cytokeratin 5/6, estrogen receptor, progesterone receptor and WT-1 is performed. 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   07/26/2015 Adverse Reaction   Loose stools, 2-3 per 24 hours   07/29/2015 Treatment Plan Change   5FU bolus discontinued due to loose stools.   08/05/2015 Adverse Reaction   Reaction to Oxaliplatin   09/01/2015 Treatment Plan Change   Decrease 5FU CI by 10%   09/30/2015 Treatment Plan Change   Oxaliplatin reduced by 10%   12/02/2015 Treatment Plan Change   Treatement  deferred x 1 week, thrombocytopenia at 85,000   05/17/2016 Imaging   CT scans :Status post total abdominal hysterectomy and bilateral salpingo-oophorectomy, with no evidence to suggest local recurrence of disease or metastatic disease in the abdomen or pelvis. Previously noted tiny volume of ascites and mildly enlarged right inguinal lymph node have both resolved. 2. Incidental findings, as above.      INTERVAL HISTORY:  Natalie Ball 68 y.o. female presents today for follow up. Reports overall doing well.  Denies any significant fatigue. Denies any abdominal pain. Denies any changes in appetite. No change in bowel habits. Denies any changes to her health history. She is here for repeat labs and office visit.  She has some neuropathy which is also stable.  Appetite and energy levels are 100%.  REVIEW OF SYSTEMS:  Review of Systems  Neurological: Positive for numbness.  All other systems reviewed and are negative.    PAST MEDICAL/SURGICAL HISTORY:  Past Medical History:  Diagnosis Date  . Allergy   . Complication of anesthesia   . Family history of colon cancer   . Family history of ovarian cancer   . Family history of pancreatic cancer   . GERD (gastroesophageal reflux disease)   . Headache   . History of bronchitis   . 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 Pneumonia   . PONV (postoperative nausea and vomiting)   . Tinnitus   . Urinary urgency   . Vertigo   . Wears glasses    Past Surgical History:  Procedure Laterality Date  . ABDOMINAL HYSTERECTOMY N/A 06/26/2015   Procedure: TOTAL HYSTERECTOMY ABDOMINAL;  Surgeon: [name], MD;  Location: [facility];  Service: Gynecology;  Laterality: N/A;  .  APPENDECTOMY    . DEBULKING N/A 06/26/2015   Procedure: bilateral periaortic lymph node dissection biopsies and peritoneal washings;  Surgeon: [name], MD;  Location: [facility];  Service: Gynecology;  Laterality: N/A;  . DIAGNOSTIC LAPAROSCOPY  1977  . LAPAROTOMY N/A  06/26/2015   Procedure: EXPLORATORY LAPAROTOMY;  Surgeon: [name], MD;  Location: [facility];  Service: Gynecology;  Laterality: N/A;  . OMENTECTOMY N/A 06/26/2015   Procedure:  OMENTECTOMY;  Surgeon: [name], MD;  Location: [facility];  Service: Gynecology;  Laterality: N/A;  . PORT-A-CATH REMOVAL N/A 07/03/2018   Procedure: MINOR REMOVAL PORT-A-CATH;  Surgeon: Aviva Signs, MD;  Location: [facility];  Service: General;  Laterality: N/A;  . PORTACATH PLACEMENT Left 07/18/2015   Procedure: INSERTION PORT-A-CATH;  Surgeon: Aviva Signs, MD;  Location: [facility];  Service: General;  Laterality: Left;  pt knows to arrive at 6:15  . rectal fusula  1980  . SALPINGOOPHORECTOMY Bilateral 06/26/2015   Procedure: SALPINGO OOPHORECTOMY;  Surgeon: [name], MD;  Location: [facility];  Service: Gynecology;  Laterality: Bilateral;  . TUBAL LIGATION  1974     SOCIAL HISTORY:  Social History   Socioeconomic History  . Marital status: Widowed    Spouse name: Not on file  . Number of children: 1  . Years of education: Not on file  . Highest education level: Not on file  Occupational History  . Not on file  Social Needs  . Financial resource strain: Not on file  . Food insecurity    Worry: Not on file    Inability: Not on file  . Transportation needs    Medical: Not on file    Non-medical: Not on file  Tobacco Use  . Smoking status: Former Smoker    Packs/day: 1.00    Years: 19.00    Pack years: 19.00    Types: Cigarettes    Quit date: 06/14/1988    Years since quitting: 30.4  . Smokeless tobacco: Never Used  Substance and Sexual Activity  . Alcohol use: Yes    Comment: occasionally   . Drug use: No  . Sexual activity: Not on file  Lifestyle  . Physical activity    Days per week: Not on file    Minutes per session: Not on file  . Stress: Not on file  Relationships  . Social Herbalist on phone: Not on file    Gets together: Not on file    Attends religious service: Not on  file    Active member of club or organization: Not on file    Attends meetings of clubs or organizations: Not on file    Relationship status: Not on file  . Intimate partner violence    Fear of current or ex partner: Not on file    Emotionally abused: Not on file    Physically abused: Not on file    Forced sexual activity: Not on file  Other Topics Concern  . Not on file  Social History Narrative  . Not on file    FAMILY HISTORY:  Family History  Problem Relation Age of Onset  . Cancer Father   . Colon polyps Father   . Ovarian cancer Maternal Aunt   . Colon cancer Maternal Aunt   . Pancreatic cancer Maternal Uncle   . Colon polyps Mother   . Colon polyps Sister   . Cancer Paternal Aunt        NOS  . Heart defect Maternal  Grandmother        hole in the heart  . COPD Maternal Grandfather   . Colon cancer Paternal Grandmother        dx possibly under 21  . Mental illness Sister   . Uterine cancer Maternal Aunt        dx possibly in her 72s  . Pancreatic cancer Maternal Aunt   . Cancer Paternal Aunt        NOS  . Cancer Cousin        adrenal cancer dx in his 68s; maternal cousin  . Ovarian cancer Cousin        dx in her 46s; maternal cousin  . Diabetes Daughter     CURRENT MEDICATIONS:  Outpatient Encounter Medications as of 12/06/2018  Medication Sig  . acetaminophen (TYLENOL) 500 MG tablet Take 500 mg by mouth every 6 (six) hours as needed (pain).   . Cholecalciferol (VITAMIN D3) 125 MCG (5000 UT) CAPS Take 1 capsule by mouth daily.  . fluticasone (FLONASE) 50 MCG/ACT nasal spray Place 1 spray into both nostrils daily.  Marland Kitchen ibuprofen (ADVIL,MOTRIN) 200 MG tablet Take 400 mg by mouth every 6 (six) hours as needed for headache or moderate pain. Reported on 07/14/2015  . lidocaine-prilocaine (EMLA) cream Apply a quarter size amount to port site 1 hour prior to chemo. Do not rub in. Cover with plastic wrap.  . loratadine (CLARITIN) 10 MG tablet Take 10 mg by mouth daily as  needed for allergies.  Marland Kitchen LORazepam (ATIVAN) 0.5 MG tablet Take 1 tablet (0.5 mg total) by mouth every 8 (eight) hours. (Patient taking differently: Take 0.5 mg by mouth every 8 (eight) hours as needed for anxiety or sleep. )   Facility-Administered Encounter Medications as of 12/06/2018  Medication  . dexamethasone (DECADRON) 8 mg in sodium chloride 0.9 % 50 mL IVPB  . dexamethasone (DECADRON) 8 mg in sodium chloride 0.9 % 50 mL IVPB  . prochlorperazine (COMPAZINE) injection 10 mg  . prochlorperazine (COMPAZINE) injection 10 mg    ALLERGIES:  Allergies  Allergen Reactions  . Sulfur Other (See Comments)    Severe pain in side and couldn't hardly walk & then started vomiting after taking     PHYSICAL EXAM:  ECOG Performance status: 1  Vitals:   12/06/18 1400  BP: 125/66  Pulse: 63  Resp: 16  Temp: 98.3 F (36.8 C)  SpO2: 96%   Filed Weights   12/06/18 1400  Weight: 160 lb 8 oz (72.8 kg)    Physical Exam Constitutional:      Appearance: Normal appearance. She is obese.  HENT:     Head: Normocephalic.     Nose: Nose normal.     Mouth/Throat:     Mouth: Mucous membranes are moist.  Eyes:     Extraocular Movements: Extraocular movements intact.     Conjunctiva/sclera: Conjunctivae normal.  Neck:     Musculoskeletal: Normal range of motion.  Cardiovascular:     Rate and Rhythm: Normal rate.     Pulses: Normal pulses.     Heart sounds: Normal heart sounds.  Pulmonary:     Effort: Pulmonary effort is normal.     Breath sounds: Normal breath sounds.  Abdominal:     General: Bowel sounds are normal.     Palpations: Abdomen is soft.  Musculoskeletal: Normal range of motion.  Skin:    General: Skin is warm.  Neurological:     General: No focal deficit present.  Mental Status: She is alert and oriented to person, place, and time.  Psychiatric:        Mood and Affect: Mood normal.        Thought Content: Thought content normal.        Judgment: Judgment normal.       LABORATORY DATA:  I have reviewed the labs as listed.  CBC    Component Value Date/Time   WBC 5.5 11/29/2018 1347   RBC 3.94 11/29/2018 1347   HGB 12.8 11/29/2018 1347   HCT 39.3 11/29/2018 1347   PLT 235 11/29/2018 1347   MCV 99.7 11/29/2018 1347   MCH 32.5 11/29/2018 1347   MCHC 32.6 11/29/2018 1347   RDW 12.9 11/29/2018 1347   LYMPHSABS 1.5 11/29/2018 1347   MONOABS 0.4 11/29/2018 1347   EOSABS 0.1 11/29/2018 1347   BASOSABS 0.0 11/29/2018 1347   CMP Latest Ref Rng & Units 11/29/2018 05/03/2018 08/24/2017  Glucose 70 - 99 mg/dL 97 97 93  BUN 8 - 23 mg/dL 20 15 17   Creatinine 0.44 - 1.00 mg/dL 0.88 0.84 0.84  Sodium 135 - 145 mmol/L 139 140 139  Potassium 3.5 - 5.1 mmol/L 4.1 4.3 4.0  Chloride 98 - 111 mmol/L 104 105 103  CO2 22 - 32 mmol/L 26 26 24   Calcium 8.9 - 10.3 mg/dL 9.3 9.8 9.8  Total Protein 6.5 - 8.1 g/dL 7.1 7.2 7.4  Total Bilirubin 0.3 - 1.2 mg/dL 0.8 0.8 1.0  Alkaline Phos 38 - 126 U/L 61 57 72  AST 15 - 41 U/L 19 18 20   ALT 0 - 44 U/L 20 20 18     Radiology: I have independently reviewed her previous CT scan and discussed with her.   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 Evie Lacks. - S/P exploratory laparotomy with TAH-BSO by Dr. [name] on 06/26/2015 ; intra-surgical findings measuring 13 cm without right ovarian surface involvement. - Pathology: Ovarian mucinous adenocarcinoma, pathologically T1cN0M0. - Post operative elevation in CA-125 at 304.9 U/ml, normal post-op CEA. - Began systemic chemotherapy in the adjuvant setting on 07/22/2015 consisting of FOLFOX, complicated by allergic reaction to Oxaliplatin on cycle #2, resulting in the need for oxaliplatin desensitization.   - She completed 11/12 planned cycles of FOLFOX secondary to prolonged cytopenias and neuropathy in spite of dose reductions and delays in therapy.   - CT chest  abdomen and pelvis was done in December 2017 and showed no signs of recurrence.   - CT CAP: 05/2018: Negative for disease.  - Today, pt is doing clinically well. CEA and CA 125 are normal. Plan to repeat CT CAP in 6 mths.  - RTC in 6 mths.        Orders placed this encounter:  Orders Placed This Encounter  Procedures  . CT Chest W Contrast  . CT Abdomen Pelvis W Wo Contrast  . CBC with Differential  . Comprehensive metabolic panel  . CEA  . CA [place], [place] [phone]

## 2018-12-06 NOTE — Assessment & Plan Note (Addendum)
1. Stage IC Mucinous adenocarcinoma: - 06/2015: Large abdominopelvic mass found  by Gynecologist, Dr. Evie Lacks. - S/P exploratory laparotomy with TAH-BSO by Dr. [name] on 06/26/2015 ; intra-surgical findings measuring 13 cm without right ovarian surface involvement. - Pathology: Ovarian mucinous adenocarcinoma, pathologically T1cN0M0. - Post operative elevation in CA-125 at 304.9 U/ml, normal post-op CEA. - Began systemic chemotherapy in the adjuvant setting on 07/22/2015 consisting of FOLFOX, complicated by allergic reaction to Oxaliplatin on cycle #2, resulting in the need for oxaliplatin desensitization.   - She completed 11/12 planned cycles of FOLFOX secondary to prolonged cytopenias and neuropathy in spite of dose reductions and delays in therapy.   - CT chest abdomen and pelvis was done in December 2017 and showed no signs of recurrence.   - CT CAP: 05/2018: Negative for disease.  - Today, pt is doing clinically well. CEA and CA 125 are normal. Plan to repeat CT CAP in 6 mths.  - RTC in 6 mths.

## 2019-02-15 DIAGNOSIS — L308 Other specified dermatitis: Secondary | ICD-10-CM | POA: Diagnosis not present

## 2019-02-15 DIAGNOSIS — L818 Other specified disorders of pigmentation: Secondary | ICD-10-CM | POA: Diagnosis not present

## 2019-02-22 DIAGNOSIS — Z0000 Encounter for general adult medical examination without abnormal findings: Secondary | ICD-10-CM | POA: Diagnosis not present

## 2019-02-22 DIAGNOSIS — Z6826 Body mass index (BMI) 26.0-26.9, adult: Secondary | ICD-10-CM | POA: Diagnosis not present

## 2019-02-22 DIAGNOSIS — Z299 Encounter for prophylactic measures, unspecified: Secondary | ICD-10-CM | POA: Diagnosis not present

## 2019-02-22 DIAGNOSIS — Z1331 Encounter for screening for depression: Secondary | ICD-10-CM | POA: Diagnosis not present

## 2019-02-22 DIAGNOSIS — Z1339 Encounter for screening examination for other mental health and behavioral disorders: Secondary | ICD-10-CM | POA: Diagnosis not present

## 2019-02-22 DIAGNOSIS — Z7189 Other specified counseling: Secondary | ICD-10-CM | POA: Diagnosis not present

## 2019-02-22 DIAGNOSIS — Z1211 Encounter for screening for malignant neoplasm of colon: Secondary | ICD-10-CM | POA: Diagnosis not present

## 2019-03-07 DIAGNOSIS — M9901 Segmental and somatic dysfunction of cervical region: Secondary | ICD-10-CM | POA: Diagnosis not present

## 2019-03-07 DIAGNOSIS — S134XXA Sprain of ligaments of cervical spine, initial encounter: Secondary | ICD-10-CM | POA: Diagnosis not present

## 2019-03-16 DIAGNOSIS — E8940 Asymptomatic postprocedural ovarian failure: Secondary | ICD-10-CM | POA: Diagnosis not present

## 2019-04-10 ENCOUNTER — Other Ambulatory Visit: Payer: Self-pay | Admitting: *Deleted

## 2019-04-10 DIAGNOSIS — Z20822 Contact with and (suspected) exposure to covid-19: Secondary | ICD-10-CM

## 2019-04-11 LAB — NOVEL CORONAVIRUS, NAA: SARS-CoV-2, NAA: DETECTED — AB

## 2019-04-12 ENCOUNTER — Telehealth: Payer: Self-pay | Admitting: *Deleted

## 2019-04-12 NOTE — Telephone Encounter (Signed)
See result note regarding COVID 19 test result

## 2019-05-09 DIAGNOSIS — E039 Hypothyroidism, unspecified: Secondary | ICD-10-CM | POA: Diagnosis not present

## 2019-05-09 DIAGNOSIS — C569 Malignant neoplasm of unspecified ovary: Secondary | ICD-10-CM | POA: Diagnosis not present

## 2019-05-09 DIAGNOSIS — Z87891 Personal history of nicotine dependence: Secondary | ICD-10-CM | POA: Diagnosis not present

## 2019-05-09 DIAGNOSIS — Z6825 Body mass index (BMI) 25.0-25.9, adult: Secondary | ICD-10-CM | POA: Diagnosis not present

## 2019-05-09 DIAGNOSIS — Z299 Encounter for prophylactic measures, unspecified: Secondary | ICD-10-CM | POA: Diagnosis not present

## 2019-05-09 DIAGNOSIS — G629 Polyneuropathy, unspecified: Secondary | ICD-10-CM | POA: Diagnosis not present

## 2019-05-21 DIAGNOSIS — Z1231 Encounter for screening mammogram for malignant neoplasm of breast: Secondary | ICD-10-CM | POA: Diagnosis not present

## 2019-06-20 ENCOUNTER — Ambulatory Visit (HOSPITAL_COMMUNITY)
Admission: RE | Admit: 2019-06-20 | Discharge: 2019-06-20 | Disposition: A | Discharge status: Home / Self Care | Payer: PPO | Source: Ambulatory Visit | Attending: Hematology | Admitting: Hematology

## 2019-06-20 ENCOUNTER — Inpatient Hospital Stay (HOSPITAL_COMMUNITY): Payer: PPO | Attending: Hematology

## 2019-06-20 ENCOUNTER — Other Ambulatory Visit: Payer: Self-pay

## 2019-06-20 DIAGNOSIS — C569 Malignant neoplasm of unspecified ovary: Secondary | ICD-10-CM | POA: Diagnosis not present

## 2019-06-20 DIAGNOSIS — C801 Malignant (primary) neoplasm, unspecified: Secondary | ICD-10-CM

## 2019-06-20 DIAGNOSIS — K5730 Diverticulosis of large intestine without perforation or abscess without bleeding: Secondary | ICD-10-CM | POA: Diagnosis not present

## 2019-06-20 DIAGNOSIS — R918 Other nonspecific abnormal finding of lung field: Secondary | ICD-10-CM | POA: Diagnosis not present

## 2019-06-20 DIAGNOSIS — K449 Diaphragmatic hernia without obstruction or gangrene: Secondary | ICD-10-CM | POA: Insufficient documentation

## 2019-06-20 DIAGNOSIS — Z90722 Acquired absence of ovaries, bilateral: Secondary | ICD-10-CM | POA: Diagnosis not present

## 2019-06-20 DIAGNOSIS — Z90710 Acquired absence of both cervix and uterus: Secondary | ICD-10-CM | POA: Insufficient documentation

## 2019-06-20 DIAGNOSIS — Z8543 Personal history of malignant neoplasm of ovary: Secondary | ICD-10-CM | POA: Diagnosis not present

## 2019-06-20 DIAGNOSIS — Z9221 Personal history of antineoplastic chemotherapy: Secondary | ICD-10-CM | POA: Insufficient documentation

## 2019-06-20 LAB — COMPREHENSIVE METABOLIC PANEL
ALT: 21 U/L (ref 0–44)
AST: 18 U/L (ref 15–41)
Albumin: 4.4 g/dL (ref 3.5–5.0)
Alkaline Phosphatase: 51 U/L (ref 38–126)
Anion gap: 8 (ref 5–15)
BUN: 12 mg/dL (ref 8–23)
CO2: 24 mmol/L (ref 22–32)
Calcium: 9 mg/dL (ref 8.9–10.3)
Chloride: 99 mmol/L (ref 98–111)
Creatinine, Ser: 0.84 mg/dL (ref 0.44–1.00)
GFR calc Af Amer: >60 mL/min (ref >60)
GFR calc non Af Amer: >60 mL/min (ref >60)
Glucose, Bld: 96 mg/dL (ref 70–99)
Potassium: 4.5 mmol/L (ref 3.5–5.1)
Sodium: 131 mmol/L — ABNORMAL LOW (ref 135–145)
Total Bilirubin: 1 mg/dL (ref 0.3–1.2)
Total Protein: 7.2 g/dL (ref 6.5–8.1)

## 2019-06-20 LAB — CBC WITH DIFFERENTIAL/PLATELET
Abs Immature Granulocytes: 0.02 10*3/uL (ref 0.00–0.07)
Basophils Absolute: 0 10*3/uL (ref 0.0–0.1)
Basophils Relative: 1 %
Eosinophils Absolute: 0 10*3/uL (ref 0.0–0.5)
Eosinophils Relative: 1 %
HCT: 40.6 % (ref 36.0–46.0)
Hemoglobin: 13.2 g/dL (ref 12.0–15.0)
Immature Granulocytes: 0 %
Lymphocytes Relative: 23 %
Lymphs Abs: 1.3 10*3/uL (ref 0.7–4.0)
MCH: 32.7 pg (ref 26.0–34.0)
MCHC: 32.5 g/dL (ref 30.0–36.0)
MCV: 100.5 fL — ABNORMAL HIGH (ref 80.0–100.0)
Monocytes Absolute: 0.4 10*3/uL (ref 0.1–1.0)
Monocytes Relative: 7 %
Neutro Abs: 3.9 10*3/uL (ref 1.7–7.7)
Neutrophils Relative %: 68 %
Platelets: 261 10*3/uL (ref 150–400)
RBC: 4.04 MIL/uL (ref 3.87–5.11)
RDW: 12.5 % (ref 11.5–15.5)
WBC: 5.7 10*3/uL (ref 4.0–10.5)
nRBC: 0 % (ref 0.0–0.2)

## 2019-06-20 LAB — POCT I-STAT CREATININE: Creatinine, Ser: 0.9 mg/dL (ref 0.44–1.00)

## 2019-06-20 MED ORDER — IOHEXOL 300 MG/ML  SOLN
100.0000 mL | Freq: Once | INTRAMUSCULAR | Status: AC | PRN
  Administered 2019-06-20: 100 mL via INTRAVENOUS

## 2019-06-21 LAB — CA 125: Cancer Antigen (CA) 125: 12.6 U/mL (ref 0.0–38.1)

## 2019-06-21 LAB — CEA: CEA: 2 ng/mL (ref 0.0–4.7)

## 2019-06-22 ENCOUNTER — Ambulatory Visit (HOSPITAL_COMMUNITY): Payer: PPO | Admitting: Hematology

## 2019-06-22 ENCOUNTER — Inpatient Hospital Stay (HOSPITAL_BASED_OUTPATIENT_CLINIC_OR_DEPARTMENT_OTHER): Payer: PPO | Admitting: Hematology

## 2019-06-22 DIAGNOSIS — C561 Malignant neoplasm of right ovary: Secondary | ICD-10-CM

## 2019-06-22 DIAGNOSIS — C801 Malignant (primary) neoplasm, unspecified: Secondary | ICD-10-CM

## 2019-06-22 NOTE — Progress Notes (Signed)
I connected with Horton Marshall on 06/22/19 at  9:10 AM EST by telephone visit and verified that I am speaking with the correct person using two identifiers.   I discussed the limitations, risks, security and privacy concerns of performing an evaluation and management service by telemedicine and the availability of in-person appointments. I also discussed with the patient that there may be a patient responsible charge related to this service. The patient expressed understanding and agreed to proceed.    Chief Complaint:  - Mucinous adenocarcinoma  HPI:  Connected with Mrs. Bittman via telephone.  She reports overall doing well.  She denies any changes in her health history since her last office visit.  Denies any obvious signs of bleeding.  Denies any new abdominal pains.  Bowel movements are within normal.  No change in appetite.  No weight loss.  We will discuss her most recent CT scan and labs today.    Assessment and Plan:  1. Stage IC Mucinous adenocarcinoma: - 06/2015: Large abdominopelvic mass found  by Gynecologist, Dr. Evie Lacks. - S/P exploratory laparotomy with TAH-BSO by Dr. [name] on 06/26/2015 ; intra-surgical findings measuring 13 cm without right ovarian surface involvement. - Pathology: Ovarian mucinous adenocarcinoma, pathologically T1cN0M0. - Post operative elevation in CA-125 at 304.9 U/ml, normal post-op CEA. - Began systemic chemotherapy in the adjuvant setting on 07/22/2015 consisting of FOLFOX, complicated by allergic reaction to Oxaliplatin on cycle #2, resulting in the need for oxaliplatin desensitization.   - She completed 11/12 planned cycles of FOLFOX secondary to prolonged cytopenias and neuropathy in spite of dose reductions and delays in therapy.   - CT chest abdomen and pelvis was done in December 2017 and showed no signs of recurrence.   - CT CAP: 05/2018: Negative for disease.  - Today, pt is doing clinically well. CEA and CA 125 are normal.  Surveillance CT  CAP: No evidence of malignancy.  - RTC in 6 mths with repeat labs.

## 2019-06-22 NOTE — Assessment & Plan Note (Addendum)
1. Stage IC Mucinous adenocarcinoma: - 06/2015: Large abdominopelvic mass found  by Gynecologist, Dr. Evie Lacks. - S/P exploratory laparotomy with TAH-BSO by Dr. [name] on 06/26/2015 ; intra-surgical findings measuring 13 cm without right ovarian surface involvement. - Pathology: Ovarian mucinous adenocarcinoma, pathologically T1cN0M0. - Post operative elevation in CA-125 at 304.9 U/ml, normal post-op CEA. - Began systemic chemotherapy in the adjuvant setting on 07/22/2015 consisting of FOLFOX, complicated by allergic reaction to Oxaliplatin on cycle #2, resulting in the need for oxaliplatin desensitization.   - She completed 11/12 planned cycles of FOLFOX secondary to prolonged cytopenias and neuropathy in spite of dose reductions and delays in therapy.   - CT chest abdomen and pelvis was done in December 2017 and showed no signs of recurrence.   - CT CAP: 05/2018: Negative for disease.  - Today, pt is doing clinically well. CEA and CA 125 are normal.  Surveillance CT CAP: No evidence of malignancy.  - RTC in 6 mths with repeat labs.

## 2019-09-26 DIAGNOSIS — S134XXA Sprain of ligaments of cervical spine, initial encounter: Secondary | ICD-10-CM | POA: Diagnosis not present

## 2019-09-26 DIAGNOSIS — M9901 Segmental and somatic dysfunction of cervical region: Secondary | ICD-10-CM | POA: Diagnosis not present

## 2019-12-12 ENCOUNTER — Other Ambulatory Visit (HOSPITAL_COMMUNITY): Payer: Self-pay | Admitting: *Deleted

## 2019-12-12 DIAGNOSIS — C801 Malignant (primary) neoplasm, unspecified: Secondary | ICD-10-CM

## 2019-12-13 ENCOUNTER — Other Ambulatory Visit: Payer: Self-pay

## 2019-12-13 ENCOUNTER — Inpatient Hospital Stay (HOSPITAL_COMMUNITY): Payer: PPO | Attending: Hematology

## 2019-12-13 DIAGNOSIS — C561 Malignant neoplasm of right ovary: Secondary | ICD-10-CM | POA: Diagnosis not present

## 2019-12-13 DIAGNOSIS — C801 Malignant (primary) neoplasm, unspecified: Secondary | ICD-10-CM

## 2019-12-13 LAB — CBC WITH DIFFERENTIAL/PLATELET
Abs Immature Granulocytes: 0.01 10*3/uL (ref 0.00–0.07)
Basophils Absolute: 0 10*3/uL (ref 0.0–0.1)
Basophils Relative: 1 %
Eosinophils Absolute: 0.1 10*3/uL (ref 0.0–0.5)
Eosinophils Relative: 1 %
HCT: 40 % (ref 36.0–46.0)
Hemoglobin: 13.2 g/dL (ref 12.0–15.0)
Immature Granulocytes: 0 %
Lymphocytes Relative: 26 %
Lymphs Abs: 1.7 10*3/uL (ref 0.7–4.0)
MCH: 33.2 pg (ref 26.0–34.0)
MCHC: 33 g/dL (ref 30.0–36.0)
MCV: 100.8 fL — ABNORMAL HIGH (ref 80.0–100.0)
Monocytes Absolute: 0.5 10*3/uL (ref 0.1–1.0)
Monocytes Relative: 8 %
Neutro Abs: 4.4 10*3/uL (ref 1.7–7.7)
Neutrophils Relative %: 64 %
Platelets: 249 10*3/uL (ref 150–400)
RBC: 3.97 MIL/uL (ref 3.87–5.11)
RDW: 12.4 % (ref 11.5–15.5)
WBC: 6.7 10*3/uL (ref 4.0–10.5)
nRBC: 0 % (ref 0.0–0.2)

## 2019-12-13 LAB — COMPREHENSIVE METABOLIC PANEL
ALT: 17 U/L (ref 0–44)
AST: 17 U/L (ref 15–41)
Albumin: 4.5 g/dL (ref 3.5–5.0)
Alkaline Phosphatase: 54 U/L (ref 38–126)
Anion gap: 10 (ref 5–15)
BUN: 14 mg/dL (ref 8–23)
CO2: 22 mmol/L (ref 22–32)
Calcium: 9.4 mg/dL (ref 8.9–10.3)
Chloride: 103 mmol/L (ref 98–111)
Creatinine, Ser: 0.77 mg/dL (ref 0.44–1.00)
GFR calc Af Amer: >60 mL/min (ref >60)
GFR calc non Af Amer: >60 mL/min (ref >60)
Glucose, Bld: 100 mg/dL — ABNORMAL HIGH (ref 70–99)
Potassium: 4 mmol/L (ref 3.5–5.1)
Sodium: 135 mmol/L (ref 135–145)
Total Bilirubin: 0.8 mg/dL (ref 0.3–1.2)
Total Protein: 7.1 g/dL (ref 6.5–8.1)

## 2019-12-14 LAB — CA 125: Cancer Antigen (CA) 125: 13.4 U/mL (ref 0.0–38.1)

## 2019-12-14 LAB — CEA: CEA: 2.4 ng/mL (ref 0.0–4.7)

## 2019-12-20 ENCOUNTER — Inpatient Hospital Stay (HOSPITAL_BASED_OUTPATIENT_CLINIC_OR_DEPARTMENT_OTHER): Payer: PPO | Admitting: Nurse Practitioner

## 2019-12-20 DIAGNOSIS — C801 Malignant (primary) neoplasm, unspecified: Secondary | ICD-10-CM | POA: Diagnosis not present

## 2019-12-20 NOTE — Progress Notes (Signed)
[facility] Follow up:    Natalie Blitz, MD [place] [address]   DIAGNOSIS: Mucinous adenocarcinoma   SUMMARY OF ONCOLOGIC HISTORY: Oncology History  Mucinous carcinoma ([place])  06/24/2015 Imaging   CT CAP- Complex cystic central pelvic mass remains suspicious for cystic ovarian carcinoma, most likely arising from the right ovary. Prominent right gonadal vasculature. Tiny hypodensity in the dome of the liver is too small to characterize, but stable.   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 - OVARIAN SURFACE NOT INVOLVED BY TUMOR. - UNREMARKABLE FALLOPIAN TUBE WITH NO ENDOMETRIOSIS OR MALIGNANCY.   06/26/2015 Procedure   Exploratory laparotomy with total hysterectomy bilateral salpingo-oophorectomy, pelvic and para-aortic lymph node dissection, omentectomy washings and biopsies for ovarian cancer by Dr. [name]   06/27/2015 Pathology Results   PERITONEAL WASHING(SPECIMEN 1 OF 1 COLLECTED 06/27/15): ATYPICAL CELLS. Immunohistochemistry calretinin, cytokeratin 5/6, estrogen receptor, progesterone receptor and WT-1 is performed. 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   07/26/2015 Adverse Reaction   Loose stools, 2-3 per 24 hours   07/29/2015 Treatment Plan Change   5FU bolus discontinued due to loose stools.   08/05/2015 Adverse Reaction   Reaction to Oxaliplatin   09/01/2015 Treatment Plan Change   Decrease 5FU CI by 10%   09/30/2015 Treatment Plan Change   Oxaliplatin reduced by 10%   12/02/2015 Treatment Plan Change   Treatement deferred x 1 week, thrombocytopenia at 85,000   05/17/2016 Imaging   CT scans :Status post total abdominal hysterectomy and  bilateral salpingo-oophorectomy, with no evidence to suggest local recurrence of disease or metastatic disease in the abdomen or pelvis. Previously noted tiny volume of ascites and mildly enlarged right inguinal lymph node have both resolved. 2. Incidental findings, as above.     CURRENT THERAPY: Observation  INTERVAL HISTORY: Natalie Ball 69 y.o. female was called for a follow-up for adenocarcinoma.  Patient reports she is doing well since her last visit.  She reports she has plenty of energy.  She denies any abdominal pain. Denies any nausea, vomiting, or diarrhea. Denies any new pains. Had not noticed any recent bleeding such as epistaxis, hematuria or hematochezia. Denies recent chest pain on exertion, shortness of breath on minimal exertion, pre-syncopal episodes, or palpitations. Denies any numbness or tingling in hands or feet. Denies any recent fevers, infections, or recent hospitalizations. Patient reports appetite at 50% and energy level at 50%.  She is eating well maintain her weight at this time.    Patient Active Problem List   Diagnosis Date Noted   Nausea without vomiting 09/16/2015   Genetic testing 08/18/2015   Family history of ovarian cancer    Family history of pancreatic cancer    Family history of colon cancer    Mucinous carcinoma ([place]) 07/10/2015    is allergic to sulfur.  MEDICAL HISTORY: Past Medical History:  Diagnosis Date   Allergy    Complication of anesthesia    Family history of colon cancer    Family history of ovarian cancer    Family history of pancreatic cancer    GERD (gastroesophageal reflux disease)    Headache    History of bronchitis    Mucinous adenocarcinoma ([place]) 07/10/2015   Pneumonia  PONV (postoperative nausea and vomiting)    Tinnitus    Urinary urgency    Vertigo    Wears glasses     SURGICAL HISTORY: Past Surgical History:  Procedure Laterality Date   ABDOMINAL HYSTERECTOMY N/A 06/26/2015    Procedure: TOTAL HYSTERECTOMY ABDOMINAL;  Surgeon: [name], MD;  Location: [facility];  Service: Gynecology;  Laterality: N/A;   APPENDECTOMY     DEBULKING N/A 06/26/2015   Procedure: bilateral periaortic lymph node dissection biopsies and peritoneal washings;  Surgeon: [name], MD;  Location: [facility];  Service: Gynecology;  Laterality: N/A;   DIAGNOSTIC LAPAROSCOPY  1977   LAPAROTOMY N/A 06/26/2015   Procedure: EXPLORATORY LAPAROTOMY;  Surgeon: [name], MD;  Location: [facility];  Service: Gynecology;  Laterality: N/A;   OMENTECTOMY N/A 06/26/2015   Procedure:  OMENTECTOMY;  Surgeon: [name], MD;  Location: [facility];  Service: Gynecology;  Laterality: N/A;   PORT-A-CATH REMOVAL N/A 07/03/2018   Procedure: MINOR REMOVAL PORT-A-CATH;  Surgeon: Aviva Signs, MD;  Location: [facility];  Service: General;  Laterality: N/A;   PORTACATH PLACEMENT Left 07/18/2015   Procedure: INSERTION PORT-A-CATH;  Surgeon: Aviva Signs, MD;  Location: [facility];  Service: General;  Laterality: Left;  pt knows to arrive at 6:15   rectal fusula  1980   SALPINGOOPHORECTOMY Bilateral 06/26/2015   Procedure: SALPINGO OOPHORECTOMY;  Surgeon: [name], MD;  Location: [facility];  Service: Gynecology;  Laterality: Bilateral;   TUBAL LIGATION  1974    SOCIAL HISTORY: Social History   Socioeconomic History   Marital status: Widowed    Spouse name: Not on file   Number of children: 1   Years of education: Not on file   Highest education level: Not on file  Occupational History   Not on file  Tobacco Use   Smoking status: Former Smoker    Packs/day: 1.00    Years: 19.00    Pack years: 19.00    Types: Cigarettes    Quit date: 06/14/1988    Years since quitting: 31.5   Smokeless tobacco: Never Used  Vaping Use   Vaping Use: Never used  Substance and Sexual Activity   Alcohol use: Yes    Comment: occasionally    Drug use: No   Sexual activity: Not on file  Other Topics Concern    Not on file  Social History Narrative   Not on file   Social Determinants of Health   Financial Resource Strain:    Difficulty of Paying Living Expenses:   Food Insecurity:    Worried About Charity fundraiser in the Last Year:    Arboriculturist in the Last Year:   Transportation Needs:    Film/video editor (Medical):    Lack of Transportation (Non-Medical):   Physical Activity:    Days of Exercise per Week:    Minutes of Exercise per Session:   Stress:    Feeling of Stress :   Social Connections:    Frequency of Communication with Friends and Family:    Frequency of Social Gatherings with Friends and Family:    Attends Religious Services:    Active Member of Clubs or Organizations:    Attends Music therapist:    Marital Status:   Intimate Partner Violence:    Fear of Current or Ex-Partner:    Emotionally Abused:    Physically Abused:    Sexually Abused:     FAMILY HISTORY: Family History  Problem Relation  Age of Onset   Cancer Father    Colon polyps Father    Ovarian cancer Maternal Aunt    Colon cancer Maternal Aunt    Pancreatic cancer Maternal Uncle    Colon polyps Mother    Colon polyps Sister    Cancer Paternal Aunt        NOS   Heart defect Maternal Grandmother        hole in the heart   COPD Maternal Grandfather    Colon cancer Paternal Grandmother        dx possibly under 68   Mental illness Sister    Uterine cancer Maternal Aunt        dx possibly in her 74s   Pancreatic cancer Maternal Aunt    Cancer Paternal Aunt        NOS   Cancer Cousin        adrenal cancer dx in his 51s; maternal cousin   Ovarian cancer Cousin        dx in her 89s; maternal cousin   Diabetes Daughter     Review of Systems  All other systems reviewed and are negative.   Vital signs: -Deferred due to telephone visit  Physical Exam -Deferred due to telephone visit -Patient was alert and oriented over the phone  in no acute distress next  LABORATORY DATA:  CBC    Component Value Date/Time   WBC 6.7 12/13/2019 1346   RBC 3.97 12/13/2019 1346   HGB 13.2 12/13/2019 1346   HCT 40.0 12/13/2019 1346   PLT 249 12/13/2019 1346   MCV 100.8 (H) 12/13/2019 1346   MCH 33.2 12/13/2019 1346   MCHC 33.0 12/13/2019 1346   RDW 12.4 12/13/2019 1346   LYMPHSABS 1.7 12/13/2019 1346   MONOABS 0.5 12/13/2019 1346   EOSABS 0.1 12/13/2019 1346   BASOSABS 0.0 12/13/2019 1346    CMP     Component Value Date/Time   NA 135 12/13/2019 1346   NA 141 06/20/2015 1121   K 4.0 12/13/2019 1346   K 4.1 06/20/2015 1121   CL 103 12/13/2019 1346   CO2 22 12/13/2019 1346   CO2 24 06/20/2015 1121   GLUCOSE 100 (H) 12/13/2019 1346   GLUCOSE 90 06/20/2015 1121   BUN 14 12/13/2019 1346   BUN 14.7 06/20/2015 1121   CREATININE 0.77 12/13/2019 1346   CREATININE 0.8 06/20/2015 1121   CALCIUM 9.4 12/13/2019 1346   CALCIUM 9.2 06/20/2015 1121   PROT 7.1 12/13/2019 1346   ALBUMIN 4.5 12/13/2019 1346   AST 17 12/13/2019 1346   ALT 17 12/13/2019 1346   ALKPHOS 54 12/13/2019 1346   BILITOT 0.8 12/13/2019 1346   GFRNONAA >60 12/13/2019 1346   GFRAA >60 12/13/2019 1346   All questions were answered to patient's stated satisfaction. Encouraged patient to call with any new concerns or questions before his next visit to the cancer center and we can certain see him sooner, if needed.      ASSESSMENT and THERAPY PLAN:   Mucinous carcinoma ([facility]) 1.  Stage Ic mucinous adenocarcinoma: -She was diagnosed on 06/2015 with a large abdominopelvic mass found by gynecologist Dr. Evie Lacks. -Status post exploratory laparotomy with TAH/BSO by Dr. [name] on 06/26/2015; intrasurgical findings measuring 13 cm without right ovarian surface involvement. -Pathology showed ovarian mucinous adenocarcinoma, pathology [id number]. -Postop elevation of CA 125 at 304.9.  Normal postop CEA. -Began systemic chemotherapy in the adjuvant setting on  07/22/2015 consistent of FOLFOX, complicated by allergic reaction  to oxaliplatin on cycle #2, resulting in the need for oxaliplatin desensitization. -She completed 11/12 planned cycles of FOLFOX secondary to prolonged cytopenias and neuropathy in spite of dose reductions and delay in therapy. -CT CAP on 05/23/2016 showed no signs of recurrence. -CT CAP on 06/20/2019 showed no signs of recurrent or metastatic disease. -Labs done on 12/13/2019 were all WNL.  CA 125 and CEA were normal. -She will follow-up in 6 months with repeat labs.   No orders of the defined types were placed in this encounter.   All questions were answered. The patient knows to call the clinic with any problems, questions or concerns. We can certainly see the patient much sooner if necessary. This note was electronically signed.  I provided 30 minutes of non face-to-face telephone visit time during this encounter, and > 50% was spent counseling as documented under my assessment & plan.   Glennie Isle, NP-C 12/20/2019

## 2019-12-20 NOTE — Assessment & Plan Note (Signed)
1.  Stage Ic mucinous adenocarcinoma: -She was diagnosed on 06/2015 with a large abdominopelvic mass found by gynecologist Dr. Evie Lacks. -Status post exploratory laparotomy with TAH/BSO by Dr. [name] on 06/26/2015; intrasurgical findings measuring 13 cm without right ovarian surface involvement. -Pathology showed ovarian mucinous adenocarcinoma, pathology [id number]. -Postop elevation of CA 125 at 304.9.  Normal postop CEA. -Began systemic chemotherapy in the adjuvant setting on 07/22/2015 consistent of FOLFOX, complicated by allergic reaction to oxaliplatin on cycle #2, resulting in the need for oxaliplatin desensitization. -She completed 11/12 planned cycles of FOLFOX secondary to prolonged cytopenias and neuropathy in spite of dose reductions and delay in therapy. -CT CAP on 05/23/2016 showed no signs of recurrence. -CT CAP on 06/20/2019 showed no signs of recurrent or metastatic disease. -Labs done on 12/13/2019 were all WNL.  CA 125 and CEA were normal. -She will follow-up in 6 months with repeat labs.

## 2020-02-26 DIAGNOSIS — E039 Hypothyroidism, unspecified: Secondary | ICD-10-CM | POA: Diagnosis not present

## 2020-02-26 DIAGNOSIS — Z1331 Encounter for screening for depression: Secondary | ICD-10-CM | POA: Diagnosis not present

## 2020-02-26 DIAGNOSIS — E7800 Pure hypercholesterolemia, unspecified: Secondary | ICD-10-CM | POA: Diagnosis not present

## 2020-02-26 DIAGNOSIS — E559 Vitamin D deficiency, unspecified: Secondary | ICD-10-CM | POA: Diagnosis not present

## 2020-02-26 DIAGNOSIS — Z0000 Encounter for general adult medical examination without abnormal findings: Secondary | ICD-10-CM | POA: Diagnosis not present

## 2020-02-26 DIAGNOSIS — Z6825 Body mass index (BMI) 25.0-25.9, adult: Secondary | ICD-10-CM | POA: Diagnosis not present

## 2020-02-26 DIAGNOSIS — Z299 Encounter for prophylactic measures, unspecified: Secondary | ICD-10-CM | POA: Diagnosis not present

## 2020-02-26 DIAGNOSIS — N393 Stress incontinence (female) (male): Secondary | ICD-10-CM | POA: Diagnosis not present

## 2020-02-26 DIAGNOSIS — Z1339 Encounter for screening examination for other mental health and behavioral disorders: Secondary | ICD-10-CM | POA: Diagnosis not present

## 2020-02-26 DIAGNOSIS — Z79899 Other long term (current) drug therapy: Secondary | ICD-10-CM | POA: Diagnosis not present

## 2020-02-26 DIAGNOSIS — R5383 Other fatigue: Secondary | ICD-10-CM | POA: Diagnosis not present

## 2020-02-26 DIAGNOSIS — Z7189 Other specified counseling: Secondary | ICD-10-CM | POA: Diagnosis not present

## 2020-05-29 DIAGNOSIS — Z1231 Encounter for screening mammogram for malignant neoplasm of breast: Secondary | ICD-10-CM | POA: Diagnosis not present

## 2020-06-25 ENCOUNTER — Other Ambulatory Visit: Payer: Self-pay

## 2020-06-25 ENCOUNTER — Inpatient Hospital Stay (HOSPITAL_COMMUNITY): Payer: PPO | Attending: Hematology

## 2020-06-25 DIAGNOSIS — Z8543 Personal history of malignant neoplasm of ovary: Secondary | ICD-10-CM | POA: Insufficient documentation

## 2020-06-25 DIAGNOSIS — Z833 Family history of diabetes mellitus: Secondary | ICD-10-CM | POA: Insufficient documentation

## 2020-06-25 DIAGNOSIS — Z800 Family history of malignant neoplasm of digestive organs: Secondary | ICD-10-CM | POA: Diagnosis not present

## 2020-06-25 DIAGNOSIS — E538 Deficiency of other specified B group vitamins: Secondary | ICD-10-CM | POA: Insufficient documentation

## 2020-06-25 DIAGNOSIS — Z9221 Personal history of antineoplastic chemotherapy: Secondary | ICD-10-CM | POA: Insufficient documentation

## 2020-06-25 DIAGNOSIS — Z8041 Family history of malignant neoplasm of ovary: Secondary | ICD-10-CM | POA: Diagnosis not present

## 2020-06-25 DIAGNOSIS — Z808 Family history of malignant neoplasm of other organs or systems: Secondary | ICD-10-CM | POA: Diagnosis not present

## 2020-06-25 DIAGNOSIS — Z79899 Other long term (current) drug therapy: Secondary | ICD-10-CM | POA: Insufficient documentation

## 2020-06-25 DIAGNOSIS — F419 Anxiety disorder, unspecified: Secondary | ICD-10-CM | POA: Diagnosis not present

## 2020-06-25 DIAGNOSIS — C801 Malignant (primary) neoplasm, unspecified: Secondary | ICD-10-CM

## 2020-06-25 DIAGNOSIS — Z803 Family history of malignant neoplasm of breast: Secondary | ICD-10-CM | POA: Insufficient documentation

## 2020-06-25 LAB — COMPREHENSIVE METABOLIC PANEL
ALT: 29 U/L (ref 0–44)
AST: 18 U/L (ref 15–41)
Albumin: 4 g/dL (ref 3.5–5.0)
Alkaline Phosphatase: 56 U/L (ref 38–126)
Anion gap: 9 (ref 5–15)
BUN: 15 mg/dL (ref 8–23)
CO2: 27 mmol/L (ref 22–32)
Calcium: 9.6 mg/dL (ref 8.9–10.3)
Chloride: 102 mmol/L (ref 98–111)
Creatinine, Ser: 0.8 mg/dL (ref 0.44–1.00)
GFR, Estimated: >60 mL/min (ref >60)
Glucose, Bld: 111 mg/dL — ABNORMAL HIGH (ref 70–99)
Potassium: 3.7 mmol/L (ref 3.5–5.1)
Sodium: 138 mmol/L (ref 135–145)
Total Bilirubin: 0.7 mg/dL (ref 0.3–1.2)
Total Protein: 6.8 g/dL (ref 6.5–8.1)

## 2020-06-25 LAB — CBC WITH DIFFERENTIAL/PLATELET
Abs Immature Granulocytes: 0.02 10*3/uL (ref 0.00–0.07)
Basophils Absolute: 0 10*3/uL (ref 0.0–0.1)
Basophils Relative: 1 %
Eosinophils Absolute: 0.1 10*3/uL (ref 0.0–0.5)
Eosinophils Relative: 2 %
HCT: 37.2 % (ref 36.0–46.0)
Hemoglobin: 12.3 g/dL (ref 12.0–15.0)
Immature Granulocytes: 0 %
Lymphocytes Relative: 26 %
Lymphs Abs: 1.4 10*3/uL (ref 0.7–4.0)
MCH: 32.9 pg (ref 26.0–34.0)
MCHC: 33.1 g/dL (ref 30.0–36.0)
MCV: 99.5 fL (ref 80.0–100.0)
Monocytes Absolute: 0.4 10*3/uL (ref 0.1–1.0)
Monocytes Relative: 7 %
Neutro Abs: 3.5 10*3/uL (ref 1.7–7.7)
Neutrophils Relative %: 64 %
Platelets: 286 10*3/uL (ref 150–400)
RBC: 3.74 MIL/uL — ABNORMAL LOW (ref 3.87–5.11)
RDW: 12.2 % (ref 11.5–15.5)
WBC: 5.4 10*3/uL (ref 4.0–10.5)
nRBC: 0 % (ref 0.0–0.2)

## 2020-06-25 LAB — LACTATE DEHYDROGENASE: LDH: 117 U/L (ref 98–192)

## 2020-06-25 LAB — VITAMIN B12: Vitamin B-12: 177 pg/mL — ABNORMAL LOW (ref 180–914)

## 2020-06-25 LAB — VITAMIN D 25 HYDROXY (VIT D DEFICIENCY, FRACTURES): Vit D, 25-Hydroxy: 42.43 ng/mL (ref 30–100)

## 2020-06-26 LAB — CA 125: Cancer Antigen (CA) 125: 14.2 U/mL (ref 0.0–38.1)

## 2020-06-26 LAB — CEA: CEA: 2 ng/mL (ref 0.0–4.7)

## 2020-07-02 ENCOUNTER — Other Ambulatory Visit (HOSPITAL_COMMUNITY): Payer: Self-pay | Admitting: *Deleted

## 2020-07-02 ENCOUNTER — Other Ambulatory Visit: Payer: Self-pay

## 2020-07-02 ENCOUNTER — Encounter (HOSPITAL_COMMUNITY): Payer: Self-pay | Admitting: Hematology

## 2020-07-02 ENCOUNTER — Inpatient Hospital Stay (HOSPITAL_COMMUNITY): Payer: PPO | Admitting: Hematology

## 2020-07-02 VITALS — BP 122/57 | HR 69 | Temp 97.2°F | Resp 18 | Wt 147.8 lb

## 2020-07-02 DIAGNOSIS — C801 Malignant (primary) neoplasm, unspecified: Secondary | ICD-10-CM

## 2020-07-02 DIAGNOSIS — Z8543 Personal history of malignant neoplasm of ovary: Secondary | ICD-10-CM | POA: Diagnosis not present

## 2020-07-02 MED ORDER — LORAZEPAM 0.5 MG PO TABS: 0.5000 mg | ORAL_TABLET | Freq: Every evening | ORAL | 3 refills | Status: DC | PRN

## 2020-07-02 NOTE — Progress Notes (Signed)
Natalie Ball, Natalie Ball   CLINIC:  Medical Oncology/Hematology  PCP:  Monico Blitz, MD [address] [place] [address] [phone]   REASON FOR VISIT:  Follow-up for ovarian mucinous adenocarcinoma  PRIOR THERAPY:  1. Ex-lap with TAH-BSO on 06/26/2015. 2. FOLFOX and [place]ox[place] x 11 cycles from 07/22/2015 to 12/30/2015.  NGS Results: Not done  CURRENT THERAPY: Observation  BRIEF ONCOLOGIC HISTORY:  Oncology History  Mucinous carcinoma ([place])  06/24/2015 Imaging   CT CAP- Complex cystic central pelvic mass remains suspicious for cystic ovarian carcinoma, most likely arising from the right ovary. Prominent right gonadal vasculature. Tiny hypodensity in the dome of the liver is too small to characterize, but stable.   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 - OVARIAN SURFACE NOT INVOLVED BY TUMOR. - UNREMARKABLE FALLOPIAN TUBE WITH NO ENDOMETRIOSIS OR MALIGNANCY.   06/26/2015 Procedure   Exploratory laparotomy with total hysterectomy bilateral salpingo-oophorectomy, pelvic and para-aortic lymph node dissection, omentectomy washings and biopsies for ovarian cancer by Dr. [name]   06/27/2015 Pathology Results   PERITONEAL WASHING(SPECIMEN 1 OF 1 COLLECTED 06/27/15): ATYPICAL CELLS. Immunohistochemistry calretinin, cytokeratin 5/6, estrogen receptor, progesterone receptor and WT-1 is performed. The morphology and immunophenotype favor reactive mesothelial cells   07/10/2015 Initial Diagnosis   Mucinous adenocarcinoma ([place])   07/22/2015 -  Chemotherapy   FOLFOX   07/24/2015 Survivorship   Genetic Counseling consultation with [name].   07/26/2015 Adverse Reaction   Loose stools, 2-3 per 24 hours   07/29/2015 Treatment Plan Change   5FU bolus discontinued due to loose stools.   08/05/2015 Adverse Reaction   Reaction to Oxaliplatin   09/01/2015 Treatment Plan Change    Decrease 5FU CI by 10%   09/30/2015 Treatment Plan Change   Oxaliplatin reduced by 10%   12/02/2015 Treatment Plan Change   Treatement deferred x 1 week, thrombocytopenia at 85,000   05/17/2016 Imaging   CT scans :Status post total abdominal hysterectomy and bilateral salpingo-oophorectomy, with no evidence to suggest local recurrence of disease or metastatic disease in the abdomen or pelvis. Previously noted tiny volume of ascites and mildly enlarged right inguinal lymph node have both resolved. 2. Incidental findings, as above.     CANCER STAGING: Cancer Staging No matching staging information was found for the patient.  INTERVAL HISTORY:  Natalie Ball, a 70 y.o. female, returns for routine follow-up of her ovarian mucinous adenocarcinoma. Natalie Ball was last contacted via telephone with Francene Finders, NP, on 12/20/2019.   Today she reports feeling well. She reports having occasional pain in her RLQ, the site where her cancer was located. Since she finished chemo in July 2017, she continues having numbness in her hands and feet which are stable; she is not taking any meds for her numbness. She takes Ativan for her anxiety and to help her sleep every couple days.   REVIEW OF SYSTEMS:  Review of Systems  Constitutional: Positive for fatigue (75%). Negative for appetite change.  Gastrointestinal: Positive for abdominal pain (3/10 occasional RLQ pain ).  Neurological: Positive for numbness (hands & feet).  Psychiatric/Behavioral: Positive for sleep disturbance.  All other systems reviewed and are negative.   PAST MEDICAL/SURGICAL HISTORY:  Past Medical History:  Diagnosis Date  . Allergy   . Complication of anesthesia   . Family history of colon cancer   . Family history of ovarian cancer   .  Family history of pancreatic cancer   . GERD (gastroesophageal reflux disease)   . Headache   . History of bronchitis   . Mucinous adenocarcinoma ([place]) 07/10/2015  . Pneumonia   .  PONV (postoperative nausea and vomiting)   . Tinnitus   . Urinary urgency   . Vertigo   . Wears glasses    Past Surgical History:  Procedure Laterality Date  . ABDOMINAL HYSTERECTOMY N/A 06/26/2015   Procedure: TOTAL HYSTERECTOMY ABDOMINAL;  Surgeon: [name], MD;  Location: [facility];  Service: Gynecology;  Laterality: N/A;  . APPENDECTOMY    . DEBULKING N/A 06/26/2015   Procedure: bilateral periaortic lymph node dissection biopsies and peritoneal washings;  Surgeon: [name], MD;  Location: [facility];  Service: Gynecology;  Laterality: N/A;  . DIAGNOSTIC LAPAROSCOPY  1977  . LAPAROTOMY N/A 06/26/2015   Procedure: EXPLORATORY LAPAROTOMY;  Surgeon: [name], MD;  Location: [facility];  Service: Gynecology;  Laterality: N/A;  . OMENTECTOMY N/A 06/26/2015   Procedure:  OMENTECTOMY;  Surgeon: [name], MD;  Location: [facility];  Service: Gynecology;  Laterality: N/A;  . PORT-A-CATH REMOVAL N/A 07/03/2018   Procedure: MINOR REMOVAL PORT-A-CATH;  Surgeon: Aviva Signs, MD;  Location: [facility];  Service: General;  Laterality: N/A;  . PORTACATH PLACEMENT Left 07/18/2015   Procedure: INSERTION PORT-A-CATH;  Surgeon: Aviva Signs, MD;  Location: [facility];  Service: General;  Laterality: Left;  pt knows to arrive at 6:15  . rectal fusula  1980  . SALPINGOOPHORECTOMY Bilateral 06/26/2015   Procedure: SALPINGO OOPHORECTOMY;  Surgeon: [name], MD;  Location: [facility];  Service: Gynecology;  Laterality: Bilateral;  . TUBAL LIGATION  1974    SOCIAL HISTORY:  Social History   Socioeconomic History  . Marital status: Widowed    Spouse name: Not on file  . Number of children: 1  . Years of education: Not on file  . Highest education level: Not on file  Occupational History  . Not on file  Tobacco Use  . Smoking status: Former Smoker    Packs/day: 1.00    Years: 19.00    Pack years: 19.00    Types: Cigarettes    Quit date: 06/14/1988    Years since quitting: 32.0  . Smokeless tobacco:  Never Used  Vaping Use  . Vaping Use: Never used  Substance and Sexual Activity  . Alcohol use: Yes    Comment: occasionally   . Drug use: No  . Sexual activity: Not on file  Other Topics Concern  . Not on file  Social History Narrative  . Not on file   Social Determinants of Health   Financial Resource Strain: Not on file  Food Insecurity: Not on file  Transportation Needs: No Transportation Needs  . Lack of Transportation (Medical): No  . Lack of Transportation (Non-Medical): No  Physical Activity: Inactive  . Days of Exercise per Week: 0 days  . Minutes of Exercise per Session: 0 min  Stress: Not on file  Social Connections: Not on file  Intimate Partner Violence: Not At Risk  . Fear of Current or Ex-Partner: No  . Emotionally Abused: No  . Physically Abused: No  . Sexually Abused: No    FAMILY HISTORY:  Family History  Problem Relation Age of Onset  . Cancer Father   . Colon polyps Father   . Ovarian cancer Maternal Aunt   . Colon cancer Maternal Aunt   . Pancreatic cancer Maternal Uncle   . Colon polyps Mother   .  Colon polyps Sister   . Cancer Paternal Aunt        NOS  . Heart defect Maternal Grandmother        hole in the heart  . COPD Maternal Grandfather   . Colon cancer Paternal Grandmother        dx possibly under 65  . Mental illness Sister   . Uterine cancer Maternal Aunt        dx possibly in her 72s  . Pancreatic cancer Maternal Aunt   . Cancer Paternal Aunt        NOS  . Cancer Cousin        adrenal cancer dx in his 79s; maternal cousin  . Ovarian cancer Cousin        dx in her 91s; maternal cousin  . Diabetes Daughter     CURRENT MEDICATIONS:  Current Outpatient Medications  Medication Sig Dispense Refill  . acetaminophen (TYLENOL) 500 MG tablet Take 500 mg by mouth every 6 (six) hours as needed (pain).     . Cholecalciferol (VITAMIN D3) 125 MCG (5000 UT) CAPS Take 1 capsule by mouth daily.    . fluticasone (FLONASE) 50 MCG/ACT nasal  spray Place 1 spray into both nostrils daily.    Marland Kitchen ibuprofen (ADVIL,MOTRIN) 200 MG tablet Take 400 mg by mouth every 6 (six) hours as needed for headache or moderate pain. Reported on 07/14/2015    . loratadine (CLARITIN) 10 MG tablet Take 10 mg by mouth daily as needed for allergies.    Marland Kitchen LORazepam (ATIVAN) 0.5 MG tablet Take 1 tablet (0.5 mg total) by mouth every 8 (eight) hours. (Patient taking differently: Take 0.5 mg by mouth every 8 (eight) hours as needed for anxiety or sleep.) 60 tablet 1   No current facility-administered medications for this visit.   Facility-Administered Medications Ordered in Other Visits  Medication Dose Route Frequency Provider Last Rate Last Admin  . dexamethasone (DECADRON) 8 mg in sodium chloride 0.9 % 50 mL IVPB  8 mg Intravenous Once Kefalas, Thomas S, PA-C      . dexamethasone (DECADRON) 8 mg in sodium chloride 0.9 % 50 mL IVPB  8 mg Intravenous Once Kefalas, Thomas S, PA-C      . prochlorperazine (COMPAZINE) injection 10 mg  10 mg Intravenous Once Kefalas, Thomas S, PA-C      . prochlorperazine (COMPAZINE) injection 10 mg  10 mg Intravenous Once Kefalas, Thomas S, PA-C        ALLERGIES:  Allergies  Allergen Reactions  . Elemental Sulfur Other (See Comments)    Severe pain in side and couldn't hardly walk & then started vomiting after taking    PHYSICAL EXAM:  Performance status (ECOG): 1 - Symptomatic but completely ambulatory  Vitals:   07/02/20 1452  BP: (!) 122/57  Pulse: 69  Resp: 18  Temp: (!) 97.2 F (36.2 C)  SpO2: 100%   Wt Readings from Last 3 Encounters:  07/02/20 147 lb 12.8 oz (67 kg)  12/06/18 160 lb 8 oz (72.8 kg)  07/03/18 156 lb (70.8 kg)   Physical Exam Vitals reviewed.  Constitutional:      Appearance: Normal appearance.  Cardiovascular:     Rate and Rhythm: Normal rate and regular rhythm.     Pulses: Normal pulses.     Heart sounds: Normal heart sounds.  Pulmonary:     Effort: Pulmonary effort is normal.      Breath sounds: Normal breath sounds.  Abdominal:  Palpations: Abdomen is soft. There is no hepatomegaly, splenomegaly or mass.     Tenderness: There is no abdominal tenderness.     Hernia: No hernia is present.  Musculoskeletal:     Right lower leg: No edema.     Left lower leg: No edema.  Neurological:     General: No focal deficit present.     Mental Status: She is alert and oriented to person, place, and time.  Psychiatric:        Mood and Affect: Mood normal.        Behavior: Behavior normal.      LABORATORY DATA:  I have reviewed the labs as listed.  CBC Latest Ref Rng & Units 06/25/2020 12/13/2019 06/20/2019  WBC 4.0 - 10.5 K/uL 5.4 6.7 5.7  Hemoglobin 12.0 - 15.0 g/dL 12.3 13.2 13.2  Hematocrit 36.0 - 46.0 % 37.2 40.0 40.6  Platelets 150 - 400 K/uL 286 249 261   CMP Latest Ref Rng & Units 06/25/2020 12/13/2019 06/20/2019  Glucose 70 - 99 mg/dL 111(H) 100(H) 96  BUN 8 - 23 mg/dL 15 14 12   Creatinine 0.44 - 1.00 mg/dL 0.80 0.77 0.84  Sodium 135 - 145 mmol/L 138 135 131(L)  Potassium 3.5 - 5.1 mmol/L 3.7 4.0 4.5  Chloride 98 - 111 mmol/L 102 103 99  CO2 22 - 32 mmol/L 27 22 24   Calcium 8.9 - 10.3 mg/dL 9.6 9.4 9.0  Total Protein 6.5 - 8.1 g/dL 6.8 7.1 7.2  Total Bilirubin 0.3 - 1.2 mg/dL 0.7 0.8 1.0  Alkaline Phos 38 - 126 U/L 56 54 51  AST 15 - 41 U/L 18 17 18   ALT 0 - 44 U/L 29 17 21    Lab Results  Component Value Date   VD25OH 42.43 06/25/2020   Lab Results  Component Value Date   CEA1 2.0 06/25/2020   CEA1 2.4 12/13/2019   CEA1 2.0 06/20/2019   Lab Results  Component Value Date   CA125 12.9 08/25/2016   CA125 12.8 05/19/2016   CA125 12.1 02/18/2016   Lab Results  Component Value Date   LDH 117 06/25/2020   LDH 132 11/29/2018   LDH 146 05/03/2018   DIAGNOSTIC IMAGING:  I have independently reviewed the scans and discussed with the patient. No results found.   ASSESSMENT:  1.  Stage IC ovarian mucinous adenocarcinoma: - 06/2015: Large abdominopelvic  mass found  by Gynecologist, Dr. Evie Lacks. - S/P exploratory laparotomy with TAH-BSO by Dr. [name] on 06/26/2015 ; intra-surgical findings measuring 13 cm without right ovarian surface involvement. - Pathology: Ovarian mucinous adenocarcinoma, pathologically [id number]. - Pre-operative elevation in CA-125 at 304.9 U/ml, normal post-op CEA. - Began systemic chemotherapy in the adjuvant setting on 07/22/2015 consisting of FOLFOX, complicated by allergic reaction to Oxaliplatin on cycle #2, resulting in the need for oxaliplatin desensitization.  -She completed 11/12 planned cycles of FOLFOX secondary to prolonged cytopenias and neuropathy in spite of dose reductions and delays in therapy.  -CT CAP on 06/20/2019 with no evidence of recurrence or metastatic disease.   PLAN:  1.  Stage IC ovarian mucinous adenocarcinoma: - She does not have any clinical signs or symptoms of recurrence. - Physical examination did not reveal any palpable masses. - Reviewed her labs which showed normal LFTs.  Tumor markers including CA125 and CEA were normal. - RTC 6 months for follow-up.  Repeat tumor markers.  I will also do last CT scan of the abdomen and pelvis.  2.  B12 deficiency: -  B12 is low at 177. - Recommend starting B12 1 mg tablet daily.  3.  Anxiety/sleep problems: - We will start her on Ativan 0.5 mg at bedtime as needed.   Orders placed this encounter:  Orders Placed This Encounter  Procedures  . CT Abdomen Pelvis W Contrast  . CBC with Differential/Platelet  . Comprehensive metabolic panel  . Lactate dehydrogenase  . Vitamin B12  . VITAMIN D 25 Hydroxy (Vit-D Deficiency, Fractures)  . CEA  . [place], MD [facility] [phone]   I, Milinda Antis, am acting as a scribe for Dr. Sanda Linger.  I, Derek Jack MD, have reviewed the above documentation for accuracy and completeness, and I agree with the above.

## 2020-07-02 NOTE — Patient Instructions (Signed)
[place] at [facility] Discharge Instructions  You were seen today by Dr. Delton Coombes. He went over your recent results. You will be scheduled for a CT scan of your abdomen before your next visit. Purchase vitamin B12 over the counter and start taking 1 mg daily. Dr. Delton Coombes will see you back in 6 months for labs and follow up.   Thank you for choosing [place] at [facility] to provide your oncology and hematology care.  To afford each patient quality time with our provider, please arrive at least 15 minutes before your scheduled appointment time.   If you have a lab appointment with the [facility] please come in thru the Main Entrance and check in at the main information desk  You need to re-schedule your appointment should you arrive 10 or more minutes late.  We strive to give you quality time with our providers, and arriving late affects you and other patients whose appointments are after yours.  Also, if you no show three or more times for appointments you may be dismissed from the clinic at the providers discretion.     Again, thank you for choosing [facility].  Our hope is that these requests will decrease the amount of time that you wait before being seen by our physicians.       _____________________________________________________________  Should you have questions after your visit to [facility], please contact our office at [phone] between the hours of 8:00 a.m. and 4:30 p.m.  Voicemails left after 4:00 p.m. will not be returned until the following business day.  For prescription refill requests, have your pharmacy contact our office and allow 72 hours.    Cancer Center Support Programs:   > Cancer Support Group  2nd Tuesday of the month 1pm-2pm, Journey Room

## 2020-07-09 DIAGNOSIS — R928 Other abnormal and inconclusive findings on diagnostic imaging of breast: Secondary | ICD-10-CM | POA: Diagnosis not present

## 2020-07-09 DIAGNOSIS — R922 Inconclusive mammogram: Secondary | ICD-10-CM | POA: Diagnosis not present

## 2020-07-09 DIAGNOSIS — N6001 Solitary cyst of right breast: Secondary | ICD-10-CM | POA: Diagnosis not present

## 2021-01-01 ENCOUNTER — Inpatient Hospital Stay (HOSPITAL_COMMUNITY): Payer: PPO | Attending: Hematology

## 2021-01-01 ENCOUNTER — Ambulatory Visit (HOSPITAL_COMMUNITY)
Admission: RE | Admit: 2021-01-01 | Discharge: 2021-01-01 | Disposition: A | Discharge status: Home / Self Care | Payer: PPO | Source: Ambulatory Visit | Attending: Hematology | Admitting: Hematology

## 2021-01-01 ENCOUNTER — Other Ambulatory Visit: Payer: Self-pay

## 2021-01-01 DIAGNOSIS — G620 Drug-induced polyneuropathy: Secondary | ICD-10-CM | POA: Insufficient documentation

## 2021-01-01 DIAGNOSIS — Z8543 Personal history of malignant neoplasm of ovary: Secondary | ICD-10-CM | POA: Diagnosis not present

## 2021-01-01 DIAGNOSIS — E538 Deficiency of other specified B group vitamins: Secondary | ICD-10-CM | POA: Diagnosis not present

## 2021-01-01 DIAGNOSIS — Z90722 Acquired absence of ovaries, bilateral: Secondary | ICD-10-CM | POA: Diagnosis not present

## 2021-01-01 DIAGNOSIS — C801 Malignant (primary) neoplasm, unspecified: Secondary | ICD-10-CM | POA: Diagnosis not present

## 2021-01-01 DIAGNOSIS — K449 Diaphragmatic hernia without obstruction or gangrene: Secondary | ICD-10-CM | POA: Diagnosis not present

## 2021-01-01 DIAGNOSIS — Z90710 Acquired absence of both cervix and uterus: Secondary | ICD-10-CM | POA: Diagnosis not present

## 2021-01-01 DIAGNOSIS — K5730 Diverticulosis of large intestine without perforation or abscess without bleeding: Secondary | ICD-10-CM | POA: Diagnosis not present

## 2021-01-01 DIAGNOSIS — T451X5A Adverse effect of antineoplastic and immunosuppressive drugs, initial encounter: Secondary | ICD-10-CM | POA: Diagnosis not present

## 2021-01-01 LAB — CBC WITH DIFFERENTIAL/PLATELET
Abs Immature Granulocytes: 0.02 10*3/uL (ref 0.00–0.07)
Basophils Absolute: 0.1 10*3/uL (ref 0.0–0.1)
Basophils Relative: 1 %
Eosinophils Absolute: 0.1 10*3/uL (ref 0.0–0.5)
Eosinophils Relative: 2 %
HCT: 42.2 % (ref 36.0–46.0)
Hemoglobin: 13.9 g/dL (ref 12.0–15.0)
Immature Granulocytes: 0 %
Lymphocytes Relative: 22 %
Lymphs Abs: 1.6 10*3/uL (ref 0.7–4.0)
MCH: 33.3 pg (ref 26.0–34.0)
MCHC: 32.9 g/dL (ref 30.0–36.0)
MCV: 101.2 fL — ABNORMAL HIGH (ref 80.0–100.0)
Monocytes Absolute: 0.4 10*3/uL (ref 0.1–1.0)
Monocytes Relative: 5 %
Neutro Abs: 5.1 10*3/uL (ref 1.7–7.7)
Neutrophils Relative %: 70 %
Platelets: 293 10*3/uL (ref 150–400)
RBC: 4.17 MIL/uL (ref 3.87–5.11)
RDW: 12.2 % (ref 11.5–15.5)
WBC: 7.3 10*3/uL (ref 4.0–10.5)
nRBC: 0 % (ref 0.0–0.2)

## 2021-01-01 LAB — COMPREHENSIVE METABOLIC PANEL
ALT: 17 U/L (ref 0–44)
AST: 17 U/L (ref 15–41)
Albumin: 4.4 g/dL (ref 3.5–5.0)
Alkaline Phosphatase: 58 U/L (ref 38–126)
Anion gap: 9 (ref 5–15)
BUN: 15 mg/dL (ref 8–23)
CO2: 26 mmol/L (ref 22–32)
Calcium: 9.3 mg/dL (ref 8.9–10.3)
Chloride: 100 mmol/L (ref 98–111)
Creatinine, Ser: 0.8 mg/dL (ref 0.44–1.00)
GFR, Estimated: >60 mL/min (ref >60)
Glucose, Bld: 88 mg/dL (ref 70–99)
Potassium: 4.2 mmol/L (ref 3.5–5.1)
Sodium: 135 mmol/L (ref 135–145)
Total Bilirubin: 0.7 mg/dL (ref 0.3–1.2)
Total Protein: 7.7 g/dL (ref 6.5–8.1)

## 2021-01-01 LAB — VITAMIN B12: Vitamin B-12: 987 pg/mL — ABNORMAL HIGH (ref 180–914)

## 2021-01-01 LAB — LACTATE DEHYDROGENASE: LDH: 130 U/L (ref 98–192)

## 2021-01-01 LAB — VITAMIN D 25 HYDROXY (VIT D DEFICIENCY, FRACTURES): Vit D, 25-Hydroxy: 72.13 ng/mL (ref 30–100)

## 2021-01-01 LAB — POCT I-STAT CREATININE: Creatinine, Ser: 0.9 mg/dL (ref 0.44–1.00)

## 2021-01-01 MED ORDER — IOHEXOL 300 MG/ML  SOLN
100.0000 mL | Freq: Once | INTRAMUSCULAR | Status: AC | PRN
  Administered 2021-01-01: 100 mL via INTRAVENOUS

## 2021-01-02 LAB — CEA: CEA: 2.3 ng/mL (ref 0.0–4.7)

## 2021-01-02 LAB — CA 125: Cancer Antigen (CA) 125: 14.8 U/mL (ref 0.0–38.1)

## 2021-01-07 ENCOUNTER — Other Ambulatory Visit: Payer: Self-pay

## 2021-01-07 ENCOUNTER — Encounter (HOSPITAL_COMMUNITY): Payer: Self-pay | Admitting: Hematology and Oncology

## 2021-01-07 ENCOUNTER — Inpatient Hospital Stay (HOSPITAL_COMMUNITY): Payer: PPO | Admitting: Hematology and Oncology

## 2021-01-07 DIAGNOSIS — C801 Malignant (primary) neoplasm, unspecified: Secondary | ICD-10-CM | POA: Diagnosis not present

## 2021-01-07 DIAGNOSIS — E538 Deficiency of other specified B group vitamins: Secondary | ICD-10-CM

## 2021-01-07 DIAGNOSIS — G620 Drug-induced polyneuropathy: Secondary | ICD-10-CM

## 2021-01-07 DIAGNOSIS — T451X5A Adverse effect of antineoplastic and immunosuppressive drugs, initial encounter: Secondary | ICD-10-CM | POA: Diagnosis not present

## 2021-01-07 NOTE — Assessment & Plan Note (Signed)
I have reviewed her blood work and imaging study She has no signs of cancer recurrence The patient is a long-term cancer survivor She does not need long-term follow-up at the cancer center I educated the patient signs and symptoms to watch out for cancer recurrence

## 2021-01-07 NOTE — Progress Notes (Signed)
[facility] FOLLOW-UP progress notes  Patient Care Team: Monico Blitz, MD as PCP - General (Internal Medicine)  CHIEF COMPLAINTS/PURPOSE OF VISIT:  History of ovarian cancer, for further evaluation and management  HISTORY OF PRESENTING ILLNESS:  Natalie Ball 70 y.o. 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 Prominent right gonadal vasculature. Tiny hypodensity in the dome of the liver is too small to characterize, but stable.    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 - OVARIAN SURFACE NOT INVOLVED BY TUMOR. - UNREMARKABLE FALLOPIAN TUBE WITH NO ENDOMETRIOSIS OR MALIGNANCY.    06/26/2015 Procedure   Exploratory laparotomy with total hysterectomy bilateral salpingo-oophorectomy, pelvic and para-aortic lymph node dissection, omentectomy washings and biopsies for ovarian cancer by Dr. [name]    06/27/2015 Pathology Results   PERITONEAL WASHING(SPECIMEN 1 OF 1 COLLECTED 06/27/15): ATYPICAL CELLS. Immunohistochemistry calretinin, cytokeratin 5/6, estrogen receptor, progesterone receptor and WT-1 is performed. 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    07/26/2015 Adverse Reaction   Loose stools, 2-3 per 24 hours    07/29/2015 Treatment Plan Change   5FU bolus discontinued due to loose stools.    08/05/2015 Adverse Reaction   Reaction to Oxaliplatin    09/01/2015 Treatment Plan Change   Decrease 5FU CI by 10%    09/30/2015 Treatment Plan Change   Oxaliplatin reduced by 10%    12/02/2015 Treatment Plan Change   Treatement deferred x 1 week, thrombocytopenia at 85,000    05/17/2016 Imaging   CT scans :Status post total abdominal hysterectomy and bilateral salpingo-oophorectomy, with no evidence to suggest local recurrence of disease or metastatic disease in the abdomen or pelvis. Previously noted tiny volume of ascites and mildly enlarged right inguinal lymph node have both resolved. 2. Incidental findings, as above.      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[place]) 07/10/2015   Pneumonia    PONV (postoperative nausea and vomiting)    Tinnitus    Urinary urgency    Vertigo    Wears glasses     SURGICAL HISTORY: Past Surgical History:  Procedure Laterality Date   ABDOMINAL HYSTERECTOMY N/A 06/26/2015   Procedure: TOTAL HYSTERECTOMY ABDOMINAL;  Surgeon: [name], MD;  Location: [facility];  Service: Gynecology;  Laterality: N/A;   APPENDECTOMY     DEBULKING N/A 06/26/2015   Procedure: bilateral periaortic lymph node dissection biopsies and peritoneal washings;  Surgeon: [name], MD;  Location: [facility];  Service: Gynecology;  Laterality: N/A;   DIAGNOSTIC LAPAROSCOPY  1977   LAPAROTOMY N/A 06/26/2015   Procedure: EXPLORATORY LAPAROTOMY;  Surgeon: [name], MD;  Location: [facility];  Service: Gynecology;  Laterality: N/A;    OMENTECTOMY N/A 06/26/2015   Procedure:  OMENTECTOMY;  Surgeon: [name], MD;  Location: [facility];  Service: Gynecology;  Laterality: N/A;   PORT-A-CATH REMOVAL N/A 07/03/2018   Procedure: MINOR REMOVAL PORT-A-CATH;  Surgeon: Aviva Signs, MD;  Location: [facility];  Service: General;  Laterality: N/A;   PORTACATH PLACEMENT Left 07/18/2015   Procedure: INSERTION PORT-A-CATH;  Surgeon: Aviva Signs, MD;  Location: [facility];  Service: General;  Laterality: Left;  pt knows to arrive at 6:15   rectal fusula  1980   SALPINGOOPHORECTOMY Bilateral 06/26/2015   Procedure: SALPINGO OOPHORECTOMY;  Surgeon: [name], MD;  Location: [facility];  Service: Gynecology;  Laterality: Bilateral;   TUBAL LIGATION  1974    SOCIAL HISTORY: Social History   Socioeconomic History   Marital status: Widowed    Spouse name: Not on file   Number of children: 1   Years of education: Not on file   Highest education level: Not on file  Occupational History   Not on file  Tobacco Use   Smoking status: Former    Packs/day: 1.00    Years: 19.00    Pack years: 19.00    Types: Cigarettes    Quit date: 06/14/1988    Years since quitting: 32.5   Smokeless tobacco: Never  Vaping Use   Vaping Use: Never used  Substance and Sexual Activity   Alcohol use: Yes    Comment: occasionally    Drug use: No   Sexual activity: Not on file  Other Topics Concern   Not on file  Social History Narrative   Not on file   Social Determinants of Health   Financial Resource Strain: Not on file  Food Insecurity: Not on file  Transportation Needs: No Transportation Needs   Lack of Transportation (Medical): No   Lack of Transportation (Non-Medical): No  Physical Activity: Inactive   Days of Exercise per Week: 0 days   Minutes of Exercise per Session: 0 min  Stress: Not on file  Social Connections: Not on file  Intimate Partner Violence: Not At Risk   Fear of Current or Ex-Partner: No   Emotionally Abused: No   Physically  Abused: No   Sexually Abused: No    FAMILY HISTORY: Family History  Problem Relation Age of Onset   Cancer Father    Colon polyps Father    Ovarian cancer Maternal Aunt    Colon cancer Maternal Aunt    Pancreatic cancer Maternal Uncle    Colon polyps Mother    Colon polyps Sister    Cancer Paternal Aunt        NOS   Heart defect Maternal Grandmother        hole in the heart   COPD Maternal Grandfather    Colon cancer Paternal Grandmother        dx possibly under 47   Mental illness Sister    Uterine cancer Maternal Aunt        dx possibly in her 34s   Pancreatic cancer Maternal Aunt    Cancer Paternal Aunt        NOS   Cancer Cousin        adrenal cancer dx in his 77s; maternal  cousin   Ovarian cancer Cousin        dx in her 23s; maternal cousin   Diabetes Daughter     ALLERGIES:  is allergic to elemental sulfur.  MEDICATIONS:  Current Outpatient Medications  Medication Sig Dispense Refill   Cholecalciferol (VITAMIN D3) 125 MCG (5000 UT) CAPS Take 1 capsule by mouth daily.     fluticasone (FLONASE) 50 MCG/ACT nasal spray Place 1 spray into both nostrils daily.     loratadine (CLARITIN) 10 MG tablet Take 10 mg by mouth daily as needed for allergies.     acetaminophen (TYLENOL) 500 MG tablet Take 500 mg by mouth every 6 (six) hours as needed (pain).  (Patient not taking: Reported on 01/07/2021)     ibuprofen (ADVIL,MOTRIN) 200 MG tablet Take 400 mg by mouth every 6 (six) hours as needed for headache or moderate pain. Reported on 07/14/2015 (Patient not taking: Reported on 01/07/2021)     LORazepam (ATIVAN) 0.5 MG tablet Take 1 tablet (0.5 mg total) by mouth at bedtime as needed for anxiety. (Patient not taking: Reported on 01/07/2021) 30 tablet 3   No current facility-administered medications for this visit.   Facility-Administered Medications Ordered in Other Visits  Medication Dose Route Frequency Provider Last Rate Last Admin   dexamethasone (DECADRON) 8 mg in sodium  chloride 0.9 % 50 mL IVPB  8 mg Intravenous Once Kefalas, Thomas S, PA-C       dexamethasone (DECADRON) 8 mg in sodium chloride 0.9 % 50 mL IVPB  8 mg Intravenous Once Kefalas, Thomas S, PA-C       prochlorperazine (COMPAZINE) injection 10 mg  10 mg Intravenous Once Kefalas, Thomas S, PA-C       prochlorperazine (COMPAZINE) injection 10 mg  10 mg Intravenous Once Kefalas, Thomas S, PA-C        REVIEW OF SYSTEMS:   Constitutional: Denies fevers, chills or abnormal night sweats Eyes: Denies blurriness of vision, double vision or watery eyes Ears, nose, mouth, throat, and face: Denies mucositis or sore throat Respiratory: Denies cough, dyspnea or wheezes Cardiovascular: Denies palpitation, chest discomfort or lower extremity swelling Gastrointestinal:  Denies nausea, heartburn or change in bowel habits Skin: Denies abnormal skin rashes Lymphatics: Denies new lymphadenopathy or easy bruising Neurological:Denies numbness, tingling or new weaknesses Behavioral/Psych: Mood is stable, no new changes  All other systems were reviewed with the patient and are negative.  PHYSICAL EXAMINATION: ECOG PERFORMANCE STATUS: 0 - Asymptomatic  Vitals:   01/07/21 1358  BP: (!) 139/51  Pulse: 72  Resp: 18  Temp: (!) 96.9 F (36.1 C)  SpO2: 98%   Filed Weights   01/07/21 1358  Weight: 148 lb 12.8 oz (67.5 kg)    GENERAL:alert, no distress and comfortable SKIN: skin color, texture, turgor are normal, no rashes or significant lesions EYES: normal, conjunctiva are pink and non-injected, sclera clear OROPHARYNX:no exudate, normal lips, buccal mucosa, and tongue  NECK: supple, thyroid normal size, non-tender, without nodularity LYMPH:  no palpable lymphadenopathy in the cervical, axillary or inguinal LUNGS: clear to auscultation and percussion with normal breathing effort HEART: regular rate & rhythm and no murmurs without lower extremity edema ABDOMEN:abdomen soft, non-tender and normal bowel  sounds Musculoskeletal:no cyanosis of digits and no clubbing  PSYCH: alert & oriented x 3 with fluent speech NEURO: no focal motor/sensory deficits  LABORATORY DATA:  I have reviewed the data as listed Lab Results  Component Value Date   WBC 7.3 01/01/2021   HGB 13.9 01/01/2021  HCT 42.2 01/01/2021   MCV 101.2 (H) 01/01/2021   PLT 293 01/01/2021   Recent Labs    06/25/20 1458 01/01/21 1427 01/01/21 1451  NA 138  --  135  K 3.7  --  4.2  CL 102  --  100  CO2 27  --  26  GLUCOSE 111*  --  88  BUN 15  --  15  CREATININE 0.80 0.90 0.80  CALCIUM 9.6  --  9.3  GFRNONAA >60  --  >60  PROT 6.8  --  7.7  ALBUMIN 4.0  --  4.4  AST 18  --  17  ALT 29  --  17  ALKPHOS 56  --  58  BILITOT 0.7  --  0.7    RADIOGRAPHIC STUDIES: I have personally reviewed the radiological images as listed and agreed with the findings in the report. CT Abdomen Pelvis W Contrast  Result Date: 01/06/2021 CLINICAL DATA:  History of ovarian cancer in 2017. Status post surgery and chemotherapy. EXAM: CT ABDOMEN AND PELVIS WITH CONTRAST TECHNIQUE: Multidetector CT imaging of the abdomen and pelvis was performed using the standard protocol following bolus administration of intravenous contrast. CONTRAST:  168m OMNIPAQUE IOHEXOL 300 MG/ML  SOLN COMPARISON:  CT scan 06/20/2019 FINDINGS: Lower chest: The lung bases are clear of acute process. No pleural effusion or pulmonary lesions. The heart is normal in size. No pericardial effusion. Stable small hiatal hernia. Hepatobiliary: Stable tiny low-attenuation lesion at the right hepatic dome most likely a benign cyst. No worrisome hepatic lesions or evidence of peritoneal surface disease. The gallbladder is unremarkable. No common bile duct dilatation. Pancreas: No mass, inflammation or ductal dilatation. Spleen: Normal size. No focal lesions. Adrenals/Urinary Tract: Adrenal glands and kidneys are unremarkable. The bladder is unremarkable. Stomach/Bowel: The stomach,  duodenum, small bowel and colon are unremarkable. No acute inflammatory changes, mass lesions or obstructive findings. The terminal ileum is normal. The appendix is not identified and is likely surgically absent. There is scattered colonic diverticulosis but no findings for acute diverticulitis. Vascular/Lymphatic: The aorta is normal in caliber. No dissection. Scattered atherosclerotic calcifications. Scattered branch vessel calcifications. No mesenteric or retroperitoneal mass or adenopathy. Small scattered lymph nodes are stable. No omental or peritoneal surface disease is identified. Reproductive: Surgically absent. Other: No pelvic mass or adenopathy. No free pelvic fluid collections. No inguinal mass or adenopathy. No abdominal wall hernia or subcutaneous lesions. Musculoskeletal: No significant bony findings. IMPRESSION: 1. Status post hysterectomy and bilateral oophorectomy. No findings for recurrent tumor, adenopathy or metastatic disease. 2. Stable small hiatal hernia. Electronically Signed   By: PMarijo SanesM.D.   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[place] I have reviewed her blood work and imaging study She has no signs of cancer recurrence The patient is a long-term cancer survivor She does not need long-term follow-up at the cancer center I educated the patient signs and symptoms to watch out for cancer recurrence  Peripheral neuropathy due to chemotherapy ([facility] She has mild peripheral neuropathy from prior chemotherapy She is not symptomatic Observe  Vitamin B12 deficiency She had history of vitamin B12 deficiency Recent repeat vitamin B12 level is normal  No orders of the defined types were placed in this encounter.   All questions were answered. The patient knows to call the clinic with any problems, questions or concerns. The total time spent in the appointment was 20 minutes encounter with patients including review of chart and various  tests  results, discussions about plan of care and coordination of care plan   Heath Lark, MD 01/07/2021 5:05 PM

## 2021-01-07 NOTE — Assessment & Plan Note (Signed)
She has mild peripheral neuropathy from prior chemotherapy She is not symptomatic Observe

## 2021-01-07 NOTE — Assessment & Plan Note (Signed)
She had history of vitamin B12 deficiency Recent repeat vitamin B12 level is normal

## 2021-04-05 DIAGNOSIS — J00 Acute nasopharyngitis [common cold]: Secondary | ICD-10-CM | POA: Diagnosis not present

## 2021-05-08 DIAGNOSIS — Z7189 Other specified counseling: Secondary | ICD-10-CM | POA: Diagnosis not present

## 2021-05-08 DIAGNOSIS — E559 Vitamin D deficiency, unspecified: Secondary | ICD-10-CM | POA: Diagnosis not present

## 2021-05-08 DIAGNOSIS — E7800 Pure hypercholesterolemia, unspecified: Secondary | ICD-10-CM | POA: Diagnosis not present

## 2021-05-08 DIAGNOSIS — Z299 Encounter for prophylactic measures, unspecified: Secondary | ICD-10-CM | POA: Diagnosis not present

## 2021-05-08 DIAGNOSIS — Z6825 Body mass index (BMI) 25.0-25.9, adult: Secondary | ICD-10-CM | POA: Diagnosis not present

## 2021-05-08 DIAGNOSIS — Z0000 Encounter for general adult medical examination without abnormal findings: Secondary | ICD-10-CM | POA: Diagnosis not present

## 2021-05-08 DIAGNOSIS — Z79899 Other long term (current) drug therapy: Secondary | ICD-10-CM | POA: Diagnosis not present

## 2021-05-08 DIAGNOSIS — Z1331 Encounter for screening for depression: Secondary | ICD-10-CM | POA: Diagnosis not present

## 2021-05-08 DIAGNOSIS — Z1339 Encounter for screening examination for other mental health and behavioral disorders: Secondary | ICD-10-CM | POA: Diagnosis not present

## 2021-05-08 DIAGNOSIS — R5383 Other fatigue: Secondary | ICD-10-CM | POA: Diagnosis not present

## 2021-05-18 DIAGNOSIS — B351 Tinea unguium: Secondary | ICD-10-CM | POA: Diagnosis not present

## 2021-06-18 DIAGNOSIS — Z1231 Encounter for screening mammogram for malignant neoplasm of breast: Secondary | ICD-10-CM | POA: Diagnosis not present

## 2021-07-21 NOTE — Progress Notes (Signed)
Reviewed at 01/07/21 visit by Dr Alvy Bimler

## 2021-08-17 DIAGNOSIS — B351 Tinea unguium: Secondary | ICD-10-CM | POA: Diagnosis not present

## 2021-10-22 DIAGNOSIS — Z6823 Body mass index (BMI) 23.0-23.9, adult: Secondary | ICD-10-CM | POA: Diagnosis not present

## 2021-10-22 DIAGNOSIS — R030 Elevated blood-pressure reading, without diagnosis of hypertension: Secondary | ICD-10-CM | POA: Diagnosis not present

## 2021-10-22 DIAGNOSIS — N393 Stress incontinence (female) (male): Secondary | ICD-10-CM | POA: Diagnosis not present

## 2021-11-19 DIAGNOSIS — L02512 Cutaneous abscess of left hand: Secondary | ICD-10-CM | POA: Diagnosis not present

## 2021-11-19 DIAGNOSIS — N393 Stress incontinence (female) (male): Secondary | ICD-10-CM | POA: Diagnosis not present

## 2021-11-19 DIAGNOSIS — Z6824 Body mass index (BMI) 24.0-24.9, adult: Secondary | ICD-10-CM | POA: Diagnosis not present

## 2021-11-19 DIAGNOSIS — I10 Essential (primary) hypertension: Secondary | ICD-10-CM | POA: Diagnosis not present

## 2021-11-19 DIAGNOSIS — Z1331 Encounter for screening for depression: Secondary | ICD-10-CM | POA: Diagnosis not present

## 2021-11-19 DIAGNOSIS — R946 Abnormal results of thyroid function studies: Secondary | ICD-10-CM | POA: Diagnosis not present

## 2021-11-19 DIAGNOSIS — Z1389 Encounter for screening for other disorder: Secondary | ICD-10-CM | POA: Diagnosis not present

## 2021-11-19 DIAGNOSIS — R030 Elevated blood-pressure reading, without diagnosis of hypertension: Secondary | ICD-10-CM | POA: Diagnosis not present

## 2021-11-30 DIAGNOSIS — B351 Tinea unguium: Secondary | ICD-10-CM | POA: Diagnosis not present

## 2022-03-01 DIAGNOSIS — B351 Tinea unguium: Secondary | ICD-10-CM | POA: Diagnosis not present

## 2022-03-02 DIAGNOSIS — H524 Presbyopia: Secondary | ICD-10-CM | POA: Diagnosis not present

## 2022-03-02 DIAGNOSIS — H35363 Drusen (degenerative) of macula, bilateral: Secondary | ICD-10-CM | POA: Diagnosis not present

## 2022-04-08 DIAGNOSIS — Z6823 Body mass index (BMI) 23.0-23.9, adult: Secondary | ICD-10-CM | POA: Diagnosis not present

## 2022-04-08 DIAGNOSIS — R030 Elevated blood-pressure reading, without diagnosis of hypertension: Secondary | ICD-10-CM | POA: Diagnosis not present

## 2022-04-08 DIAGNOSIS — E785 Hyperlipidemia, unspecified: Secondary | ICD-10-CM | POA: Diagnosis not present

## 2022-04-08 DIAGNOSIS — Z0001 Encounter for general adult medical examination with abnormal findings: Secondary | ICD-10-CM | POA: Diagnosis not present

## 2022-05-31 DIAGNOSIS — B351 Tinea unguium: Secondary | ICD-10-CM | POA: Diagnosis not present

## 2022-06-22 DIAGNOSIS — Z1231 Encounter for screening mammogram for malignant neoplasm of breast: Secondary | ICD-10-CM | POA: Diagnosis not present

## 2022-07-27 DIAGNOSIS — J40 Bronchitis, not specified as acute or chronic: Secondary | ICD-10-CM | POA: Diagnosis not present

## 2022-07-27 DIAGNOSIS — R059 Cough, unspecified: Secondary | ICD-10-CM | POA: Diagnosis not present

## 2022-07-27 DIAGNOSIS — Z6823 Body mass index (BMI) 23.0-23.9, adult: Secondary | ICD-10-CM | POA: Diagnosis not present

## 2022-07-27 DIAGNOSIS — R030 Elevated blood-pressure reading, without diagnosis of hypertension: Secondary | ICD-10-CM | POA: Diagnosis not present

## 2022-07-27 DIAGNOSIS — Z20828 Contact with and (suspected) exposure to other viral communicable diseases: Secondary | ICD-10-CM | POA: Diagnosis not present

## 2022-08-09 DIAGNOSIS — B351 Tinea unguium: Secondary | ICD-10-CM | POA: Diagnosis not present

## 2022-09-30 DIAGNOSIS — M531 Cervicobrachial syndrome: Secondary | ICD-10-CM | POA: Diagnosis not present

## 2022-09-30 DIAGNOSIS — M9901 Segmental and somatic dysfunction of cervical region: Secondary | ICD-10-CM | POA: Diagnosis not present

## 2022-09-30 DIAGNOSIS — M47812 Spondylosis without myelopathy or radiculopathy, cervical region: Secondary | ICD-10-CM | POA: Diagnosis not present

## 2022-10-05 DIAGNOSIS — M531 Cervicobrachial syndrome: Secondary | ICD-10-CM | POA: Diagnosis not present

## 2022-10-05 DIAGNOSIS — M47812 Spondylosis without myelopathy or radiculopathy, cervical region: Secondary | ICD-10-CM | POA: Diagnosis not present

## 2022-10-05 DIAGNOSIS — M9901 Segmental and somatic dysfunction of cervical region: Secondary | ICD-10-CM | POA: Diagnosis not present

## 2022-10-19 DIAGNOSIS — M531 Cervicobrachial syndrome: Secondary | ICD-10-CM | POA: Diagnosis not present

## 2022-10-19 DIAGNOSIS — M47812 Spondylosis without myelopathy or radiculopathy, cervical region: Secondary | ICD-10-CM | POA: Diagnosis not present

## 2022-10-19 DIAGNOSIS — M9901 Segmental and somatic dysfunction of cervical region: Secondary | ICD-10-CM | POA: Diagnosis not present

## 2022-11-09 DIAGNOSIS — M531 Cervicobrachial syndrome: Secondary | ICD-10-CM | POA: Diagnosis not present

## 2022-11-09 DIAGNOSIS — M9901 Segmental and somatic dysfunction of cervical region: Secondary | ICD-10-CM | POA: Diagnosis not present

## 2022-11-09 DIAGNOSIS — M47812 Spondylosis without myelopathy or radiculopathy, cervical region: Secondary | ICD-10-CM | POA: Diagnosis not present

## 2022-11-15 DIAGNOSIS — B351 Tinea unguium: Secondary | ICD-10-CM | POA: Diagnosis not present

## 2022-12-07 DIAGNOSIS — M47812 Spondylosis without myelopathy or radiculopathy, cervical region: Secondary | ICD-10-CM | POA: Diagnosis not present

## 2022-12-07 DIAGNOSIS — M9901 Segmental and somatic dysfunction of cervical region: Secondary | ICD-10-CM | POA: Diagnosis not present

## 2022-12-07 DIAGNOSIS — M531 Cervicobrachial syndrome: Secondary | ICD-10-CM | POA: Diagnosis not present

## 2022-12-22 DIAGNOSIS — Z20828 Contact with and (suspected) exposure to other viral communicable diseases: Secondary | ICD-10-CM | POA: Diagnosis not present

## 2022-12-22 DIAGNOSIS — F17210 Nicotine dependence, cigarettes, uncomplicated: Secondary | ICD-10-CM | POA: Diagnosis not present

## 2022-12-22 DIAGNOSIS — Z6822 Body mass index (BMI) 22.0-22.9, adult: Secondary | ICD-10-CM | POA: Diagnosis not present

## 2022-12-22 DIAGNOSIS — R030 Elevated blood-pressure reading, without diagnosis of hypertension: Secondary | ICD-10-CM | POA: Diagnosis not present

## 2022-12-22 DIAGNOSIS — J309 Allergic rhinitis, unspecified: Secondary | ICD-10-CM | POA: Diagnosis not present

## 2022-12-22 DIAGNOSIS — R059 Cough, unspecified: Secondary | ICD-10-CM | POA: Diagnosis not present

## 2022-12-28 DIAGNOSIS — M9901 Segmental and somatic dysfunction of cervical region: Secondary | ICD-10-CM | POA: Diagnosis not present

## 2022-12-28 DIAGNOSIS — M47812 Spondylosis without myelopathy or radiculopathy, cervical region: Secondary | ICD-10-CM | POA: Diagnosis not present

## 2022-12-28 DIAGNOSIS — M531 Cervicobrachial syndrome: Secondary | ICD-10-CM | POA: Diagnosis not present

## 2023-01-05 DIAGNOSIS — J439 Emphysema, unspecified: Secondary | ICD-10-CM | POA: Diagnosis not present

## 2023-01-05 DIAGNOSIS — Z122 Encounter for screening for malignant neoplasm of respiratory organs: Secondary | ICD-10-CM | POA: Diagnosis not present

## 2023-01-05 DIAGNOSIS — F17210 Nicotine dependence, cigarettes, uncomplicated: Secondary | ICD-10-CM | POA: Diagnosis not present

## 2023-01-25 DIAGNOSIS — M47812 Spondylosis without myelopathy or radiculopathy, cervical region: Secondary | ICD-10-CM | POA: Diagnosis not present

## 2023-01-25 DIAGNOSIS — M531 Cervicobrachial syndrome: Secondary | ICD-10-CM | POA: Diagnosis not present

## 2023-01-25 DIAGNOSIS — M9901 Segmental and somatic dysfunction of cervical region: Secondary | ICD-10-CM | POA: Diagnosis not present

## 2023-01-31 DIAGNOSIS — M79674 Pain in right toe(s): Secondary | ICD-10-CM | POA: Diagnosis not present

## 2023-01-31 DIAGNOSIS — B351 Tinea unguium: Secondary | ICD-10-CM | POA: Diagnosis not present

## 2023-01-31 DIAGNOSIS — M79675 Pain in left toe(s): Secondary | ICD-10-CM | POA: Diagnosis not present

## 2023-02-22 DIAGNOSIS — M531 Cervicobrachial syndrome: Secondary | ICD-10-CM | POA: Diagnosis not present

## 2023-02-22 DIAGNOSIS — M47812 Spondylosis without myelopathy or radiculopathy, cervical region: Secondary | ICD-10-CM | POA: Diagnosis not present

## 2023-02-22 DIAGNOSIS — M9901 Segmental and somatic dysfunction of cervical region: Secondary | ICD-10-CM | POA: Diagnosis not present

## 2023-03-01 DIAGNOSIS — Z6822 Body mass index (BMI) 22.0-22.9, adult: Secondary | ICD-10-CM | POA: Diagnosis not present

## 2023-03-01 DIAGNOSIS — R059 Cough, unspecified: Secondary | ICD-10-CM | POA: Diagnosis not present

## 2023-03-01 DIAGNOSIS — F17210 Nicotine dependence, cigarettes, uncomplicated: Secondary | ICD-10-CM | POA: Diagnosis not present

## 2023-03-01 DIAGNOSIS — R030 Elevated blood-pressure reading, without diagnosis of hypertension: Secondary | ICD-10-CM | POA: Diagnosis not present

## 2023-03-01 DIAGNOSIS — J309 Allergic rhinitis, unspecified: Secondary | ICD-10-CM | POA: Diagnosis not present

## 2023-03-03 DIAGNOSIS — H524 Presbyopia: Secondary | ICD-10-CM | POA: Diagnosis not present

## 2023-03-03 DIAGNOSIS — H353122 Nonexudative age-related macular degeneration, left eye, intermediate dry stage: Secondary | ICD-10-CM | POA: Diagnosis not present

## 2023-03-16 DIAGNOSIS — R059 Cough, unspecified: Secondary | ICD-10-CM | POA: Diagnosis not present

## 2023-03-16 DIAGNOSIS — J40 Bronchitis, not specified as acute or chronic: Secondary | ICD-10-CM | POA: Diagnosis not present

## 2023-03-16 DIAGNOSIS — R030 Elevated blood-pressure reading, without diagnosis of hypertension: Secondary | ICD-10-CM | POA: Diagnosis not present

## 2023-03-16 DIAGNOSIS — F17210 Nicotine dependence, cigarettes, uncomplicated: Secondary | ICD-10-CM | POA: Diagnosis not present

## 2023-03-16 DIAGNOSIS — Z6822 Body mass index (BMI) 22.0-22.9, adult: Secondary | ICD-10-CM | POA: Diagnosis not present

## 2023-03-22 DIAGNOSIS — M47812 Spondylosis without myelopathy or radiculopathy, cervical region: Secondary | ICD-10-CM | POA: Diagnosis not present

## 2023-03-22 DIAGNOSIS — M9901 Segmental and somatic dysfunction of cervical region: Secondary | ICD-10-CM | POA: Diagnosis not present

## 2023-03-22 DIAGNOSIS — M531 Cervicobrachial syndrome: Secondary | ICD-10-CM | POA: Diagnosis not present

## 2023-04-11 DIAGNOSIS — M79675 Pain in left toe(s): Secondary | ICD-10-CM | POA: Diagnosis not present

## 2023-04-11 DIAGNOSIS — B351 Tinea unguium: Secondary | ICD-10-CM | POA: Diagnosis not present

## 2023-04-11 DIAGNOSIS — M79674 Pain in right toe(s): Secondary | ICD-10-CM | POA: Diagnosis not present

## 2023-04-12 DIAGNOSIS — R030 Elevated blood-pressure reading, without diagnosis of hypertension: Secondary | ICD-10-CM | POA: Diagnosis not present

## 2023-04-12 DIAGNOSIS — J432 Centrilobular emphysema: Secondary | ICD-10-CM | POA: Diagnosis not present

## 2023-04-12 DIAGNOSIS — E785 Hyperlipidemia, unspecified: Secondary | ICD-10-CM | POA: Diagnosis not present

## 2023-04-12 DIAGNOSIS — I700 Atherosclerosis of aorta: Secondary | ICD-10-CM | POA: Diagnosis not present

## 2023-04-12 DIAGNOSIS — Z6822 Body mass index (BMI) 22.0-22.9, adult: Secondary | ICD-10-CM | POA: Diagnosis not present

## 2023-04-12 DIAGNOSIS — Z0000 Encounter for general adult medical examination without abnormal findings: Secondary | ICD-10-CM | POA: Diagnosis not present

## 2023-04-19 DIAGNOSIS — M9901 Segmental and somatic dysfunction of cervical region: Secondary | ICD-10-CM | POA: Diagnosis not present

## 2023-04-19 DIAGNOSIS — M531 Cervicobrachial syndrome: Secondary | ICD-10-CM | POA: Diagnosis not present

## 2023-04-19 DIAGNOSIS — M47812 Spondylosis without myelopathy or radiculopathy, cervical region: Secondary | ICD-10-CM | POA: Diagnosis not present

## 2023-04-24 NOTE — Progress Notes (Unsigned)
   Natalie Ball, female    DOB: 09/20/1950    MRN: [id number]   Brief patient profile:  78  yowf  occ MJ smoker  referred to pulmonary clinic in [place]  04/25/2023 by Darden Dates PA at [facility] in [place]  for abnormal CT  and recurrent cough since early summer 2024 better since last round of abx/ prednisone completed early oct 2024    Grew up in [place] with "bad allergies" since childhood never seen by allergist but some better with nasal allergies on clariton on clariton and flonase.     History of Present Illness  04/25/2023  Pulmonary/ 1st office eval/ Sherene Sires / [facility]  Chief Complaint  Patient presents with   Establish Care    CT 01/12/23   Dyspnea:  min sob / can still do full [employer] / dancing ok  Cough: gone now probably correlates with rhinitis Sleep: bed electric slt elevation s resp cc  SABA use: none  02: none      Outpatient Medications Prior to Visit  Medication Sig Dispense Refill   Cholecalciferol (VITAMIN D3) 125 MCG (5000 UT) CAPS Take 1 capsule by mouth daily.     fluticasone (FLONASE) 50 MCG/ACT nasal spray Place 1 spray into both nostrils daily.     loratadine (CLARITIN) 10 MG tablet Take 10 mg by mouth daily as needed for allergies.     predniSONE (DELTASONE) 20 MG tablet Take 20 mg by mouth daily.     Facility-Administered Medications Prior to Visit  Medication Dose Route Frequency Provider Last Rate Last Admin   dexamethasone (DECADRON) 8 mg in sodium chloride 0.9 % 50 mL IVPB  8 mg Intravenous Once Kefalas, Thomas S, PA-C       dexamethasone (DECADRON) 8 mg in sodium chloride 0.9 % 50 mL IVPB  8 mg Intravenous Once Kefalas, Thomas S, PA-C       prochlorperazine (COMPAZINE) injection 10 mg  10 mg Intravenous Once Kefalas, Thomas S, PA-C       prochlorperazine (COMPAZINE) injection 10 mg  10 mg Intravenous Once Kefalas, Thomas S, PA-C        Past Medical History:  Diagnosis Date   Allergy    Complication of anesthesia    Family history  of colon cancer    Family history of ovarian cancer    Family history of pancreatic cancer    GERD (gastroesophageal reflux disease)    Headache    History of bronchitis    Mucinous adenocarcinoma ([facility]) 07/10/2015   Pneumonia    PONV (postoperative nausea and vomiting)    Tinnitus    Urinary urgency    Vertigo    Wears glasses       Objective:     BP (!) 159/79   Pulse 60   Ht 5\' 5"  (1.651 m)   Wt 139 lb (63 kg)   SpO2 96%   BMI 23.13 kg/m   SpO2: 96 %      Assessment   No problem-specific Assessment & Plan notes found for this encounter.     Sandrea Hughs, MD 04/25/2023

## 2023-04-25 ENCOUNTER — Encounter: Payer: Self-pay | Admitting: Internal Medicine

## 2023-04-25 ENCOUNTER — Ambulatory Visit: Payer: PPO | Admitting: Internal Medicine

## 2023-04-25 VITALS — BP 159/79 | HR 60 | Ht 65.0 in | Wt 139.0 lb

## 2023-04-25 DIAGNOSIS — J309 Allergic rhinitis, unspecified: Secondary | ICD-10-CM | POA: Insufficient documentation

## 2023-04-25 DIAGNOSIS — J432 Centrilobular emphysema: Secondary | ICD-10-CM | POA: Insufficient documentation

## 2023-04-25 MED ORDER — MONTELUKAST SODIUM 10 MG PO TABS
10.0000 mg | ORAL_TABLET | Freq: Every day | ORAL | 11 refills | Status: AC
Start: 2023-04-25 — End: ?

## 2023-04-25 NOTE — Patient Instructions (Signed)
Add Singulair 10 mg each pm with flonase x one month and don't refill the singulair unless you are convinced it improves your nasal symptoms   My office will be contacting you by phone for referral to for PFTs next available   - if you don't hear back from my office within one week please call us back or notify us thru MyChart and we'll address it right away  Please remember to go to the lab department  for your tests - we will call you with the results when they are available.    If you are satisfied with your treatment plan,  let your doctor know and he/she can either refill your medications or you can return here when your prescription runs out.     If in any way you are not 100% satisfied,  please tell us.  If 100% better, tell your friends!  Pulmonary follow up is as needed

## 2023-04-25 NOTE — Assessment & Plan Note (Addendum)
ENT eval around 41 y  " it's allergies" -  Singulair 10 mg each pm with flonase - Allergy screen 04/25/2023 >  Eos 0. /  IgE

## 2023-04-26 NOTE — Assessment & Plan Note (Signed)
See CT chest  01/12/23  with MJ exposure only > very  mild emphysema not clinically signicant - 04/25/2023  Alpha one AT phenotype / PFTs ordered to complete the w/u   Based on her ex tol she does not have significant copd but I strongly advised against ongoing inhaled MJ as we don't really know the long term effects in pts susceptible to emphysema from cigarettes   F/u can be prn aecopd or declining ex tol but for now nothing to add to her Rx x for clean air to breathe, advised     Each maintenance medication was reviewed in detail including emphasizing most importantly the difference between maintenance and prns and under what circumstances the prns are to be triggered using an action plan format where appropriate.  Total time for H and P, chart review, counseling,  and generating customized AVS unique to this office visit / same day charting  > 45 min pt new to me

## 2023-04-30 LAB — CBC WITH DIFFERENTIAL/PLATELET
Basophils Absolute: 0.1 10*3/uL (ref 0.0–0.2)
Basos: 1 %
EOS (ABSOLUTE): 0.1 10*3/uL (ref 0.0–0.4)
Eos: 1 %
Hematocrit: 41 % (ref 34.0–46.6)
Hemoglobin: 13.5 g/dL (ref 11.1–15.9)
Immature Grans (Abs): 0 10*3/uL (ref 0.0–0.1)
Immature Granulocytes: 0 %
Lymphocytes Absolute: 1.3 10*3/uL (ref 0.7–3.1)
Lymphs: 19 %
MCH: 32.6 pg (ref 26.6–33.0)
MCHC: 32.9 g/dL (ref 31.5–35.7)
MCV: 99 fL — ABNORMAL HIGH (ref 79–97)
Monocytes Absolute: 0.4 10*3/uL (ref 0.1–0.9)
Monocytes: 6 %
Neutrophils Absolute: 4.8 10*3/uL (ref 1.4–7.0)
Neutrophils: 73 %
Platelets: 280 10*3/uL (ref 150–450)
RBC: 4.14 x10E6/uL (ref 3.77–5.28)
RDW: 12.4 % (ref 11.7–15.4)
WBC: 6.6 10*3/uL (ref 3.4–10.8)

## 2023-04-30 LAB — IGE: IgE (Immunoglobulin E), Serum: 87 [IU]/mL (ref 6–495)

## 2023-04-30 LAB — ALPHA-1-ANTITRYPSIN PHENOTYP: A-1 Antitrypsin: 141 mg/dL (ref 101–187)

## 2023-05-20 ENCOUNTER — Telehealth: Payer: Self-pay | Admitting: Internal Medicine

## 2023-05-20 NOTE — Telephone Encounter (Signed)
Called and spoke w patient , regarding results

## 2023-05-20 NOTE — Telephone Encounter (Signed)
Patient is returning phone call for labwork results. Patient phone number is [phone].

## 2023-05-24 DIAGNOSIS — M9901 Segmental and somatic dysfunction of cervical region: Secondary | ICD-10-CM | POA: Diagnosis not present

## 2023-05-24 DIAGNOSIS — M531 Cervicobrachial syndrome: Secondary | ICD-10-CM | POA: Diagnosis not present

## 2023-05-24 DIAGNOSIS — M47812 Spondylosis without myelopathy or radiculopathy, cervical region: Secondary | ICD-10-CM | POA: Diagnosis not present

## 2023-06-21 ENCOUNTER — Ambulatory Visit (HOSPITAL_COMMUNITY): Payer: PPO

## 2023-06-28 DIAGNOSIS — M531 Cervicobrachial syndrome: Secondary | ICD-10-CM | POA: Diagnosis not present

## 2023-06-28 DIAGNOSIS — M9901 Segmental and somatic dysfunction of cervical region: Secondary | ICD-10-CM | POA: Diagnosis not present

## 2023-06-28 DIAGNOSIS — M47812 Spondylosis without myelopathy or radiculopathy, cervical region: Secondary | ICD-10-CM | POA: Diagnosis not present

## 2023-06-30 DIAGNOSIS — Z1231 Encounter for screening mammogram for malignant neoplasm of breast: Secondary | ICD-10-CM | POA: Diagnosis not present

## 2023-07-04 DIAGNOSIS — B351 Tinea unguium: Secondary | ICD-10-CM | POA: Diagnosis not present

## 2023-07-04 DIAGNOSIS — M79675 Pain in left toe(s): Secondary | ICD-10-CM | POA: Diagnosis not present

## 2023-07-04 DIAGNOSIS — L600 Ingrowing nail: Secondary | ICD-10-CM | POA: Diagnosis not present

## 2023-07-04 DIAGNOSIS — M79674 Pain in right toe(s): Secondary | ICD-10-CM | POA: Diagnosis not present

## 2023-07-06 DIAGNOSIS — R92333 Mammographic heterogeneous density, bilateral breasts: Secondary | ICD-10-CM | POA: Diagnosis not present

## 2023-07-06 DIAGNOSIS — R928 Other abnormal and inconclusive findings on diagnostic imaging of breast: Secondary | ICD-10-CM | POA: Diagnosis not present

## 2023-08-01 DIAGNOSIS — M47812 Spondylosis without myelopathy or radiculopathy, cervical region: Secondary | ICD-10-CM | POA: Diagnosis not present

## 2023-08-01 DIAGNOSIS — M531 Cervicobrachial syndrome: Secondary | ICD-10-CM | POA: Diagnosis not present

## 2023-08-01 DIAGNOSIS — M9901 Segmental and somatic dysfunction of cervical region: Secondary | ICD-10-CM | POA: Diagnosis not present

## 2023-08-23 ENCOUNTER — Ambulatory Visit (HOSPITAL_COMMUNITY)
Admission: RE | Admit: 2023-08-23 | Discharge: 2023-08-23 | Disposition: A | Discharge status: Home / Self Care | Payer: PPO | Source: Ambulatory Visit | Attending: Internal Medicine | Admitting: Internal Medicine

## 2023-08-23 DIAGNOSIS — J432 Centrilobular emphysema: Secondary | ICD-10-CM | POA: Insufficient documentation

## 2023-08-23 LAB — PULMONARY FUNCTION TEST
DL/VA % pred: 89 %
DL/VA: 3.7 ml/min/mmHg/L
DLCO unc % pred: 94 %
DLCO unc: 18.13 ml/min/mmHg
FEF 25-75 Post: 0.45 L/s
FEF 25-75 Pre: 0.96 L/s
FEF2575-%Change-Post: -52 %
FEF2575-%Pred-Post: 25 %
FEF2575-%Pred-Pre: 53 %
FEV1-%Change-Post: -28 %
FEV1-%Pred-Post: 63 %
FEV1-%Pred-Pre: 88 %
FEV1-Post: 1.4 L
FEV1-Pre: 1.95 L
FEV1FVC-%Change-Post: -30 %
FEV1FVC-%Pred-Pre: 85 %
FEV6-%Change-Post: -5 %
FEV6-%Pred-Post: 100 %
FEV6-%Pred-Pre: 106 %
FEV6-Post: 2.8 L
FEV6-Pre: 2.97 L
FEV6FVC-%Change-Post: -9 %
FEV6FVC-%Pred-Post: 93 %
FEV6FVC-%Pred-Pre: 102 %
FVC-%Change-Post: 3 %
FVC-%Pred-Post: 107 %
FVC-%Pred-Pre: 103 %
FVC-Post: 3.15 L
FVC-Pre: 3.03 L
Post FEV1/FVC ratio: 45 %
Post FEV6/FVC ratio: 89 %
Pre FEV1/FVC ratio: 65 %
Pre FEV6/FVC Ratio: 98 %
RV % pred: 159 %
RV: 3.56 L
TLC % pred: 135 %
TLC: 6.84 L

## 2023-08-23 MED ORDER — ALBUTEROL SULFATE (2.5 MG/3ML) 0.083% IN NEBU
2.5000 mg | INHALATION_SOLUTION | Freq: Once | RESPIRATORY_TRACT | Status: AC
  Administered 2023-08-23: 2.5 mg via RESPIRATORY_TRACT

## 2023-08-26 DIAGNOSIS — W1839XA Other fall on same level, initial encounter: Secondary | ICD-10-CM | POA: Diagnosis not present

## 2023-08-26 DIAGNOSIS — S53125A Posterior dislocation of left ulnohumeral joint, initial encounter: Secondary | ICD-10-CM | POA: Diagnosis not present

## 2023-08-26 DIAGNOSIS — S53105A Unspecified dislocation of left ulnohumeral joint, initial encounter: Secondary | ICD-10-CM | POA: Diagnosis not present

## 2023-08-26 DIAGNOSIS — F17210 Nicotine dependence, cigarettes, uncomplicated: Secondary | ICD-10-CM | POA: Diagnosis not present

## 2023-08-30 ENCOUNTER — Telehealth: Payer: Self-pay | Admitting: Internal Medicine

## 2023-08-30 DIAGNOSIS — S53125A Posterior dislocation of left ulnohumeral joint, initial encounter: Secondary | ICD-10-CM | POA: Diagnosis not present

## 2023-08-30 NOTE — Telephone Encounter (Signed)
 Patient had PFT lat week and would like the results---she asks we leave a voice mail on her land line [phone] sine she has dislocated her elbow and is unable to hold the phone and write messages at the same time

## 2023-08-30 NOTE — Telephone Encounter (Signed)
 Called lvm for  lvm for patient

## 2023-09-01 DIAGNOSIS — H353122 Nonexudative age-related macular degeneration, left eye, intermediate dry stage: Secondary | ICD-10-CM | POA: Diagnosis not present

## 2023-09-01 NOTE — Telephone Encounter (Signed)
 Natalie Ball, CMA 08/30/2023  1:59 PM EDT Back to Top    Called and lvm for per pt request on her vm regarding  her results.   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

## 2023-09-07 DIAGNOSIS — R29898 Other symptoms and signs involving the musculoskeletal system: Secondary | ICD-10-CM | POA: Diagnosis not present

## 2023-09-07 DIAGNOSIS — S53125D Posterior dislocation of left ulnohumeral joint, subsequent encounter: Secondary | ICD-10-CM | POA: Diagnosis not present

## 2023-09-13 DIAGNOSIS — S53125A Posterior dislocation of left ulnohumeral joint, initial encounter: Secondary | ICD-10-CM | POA: Diagnosis not present

## 2023-09-26 DIAGNOSIS — B351 Tinea unguium: Secondary | ICD-10-CM | POA: Diagnosis not present

## 2023-09-26 DIAGNOSIS — M79674 Pain in right toe(s): Secondary | ICD-10-CM | POA: Diagnosis not present

## 2023-09-26 DIAGNOSIS — M79675 Pain in left toe(s): Secondary | ICD-10-CM | POA: Diagnosis not present

## 2023-09-26 DIAGNOSIS — L600 Ingrowing nail: Secondary | ICD-10-CM | POA: Diagnosis not present

## 2023-12-28 DIAGNOSIS — M47812 Spondylosis without myelopathy or radiculopathy, cervical region: Secondary | ICD-10-CM | POA: Diagnosis not present

## 2023-12-28 DIAGNOSIS — M9901 Segmental and somatic dysfunction of cervical region: Secondary | ICD-10-CM | POA: Diagnosis not present

## 2023-12-28 DIAGNOSIS — M531 Cervicobrachial syndrome: Secondary | ICD-10-CM | POA: Diagnosis not present

## 2023-12-29 DIAGNOSIS — M531 Cervicobrachial syndrome: Secondary | ICD-10-CM | POA: Diagnosis not present

## 2023-12-29 DIAGNOSIS — M47812 Spondylosis without myelopathy or radiculopathy, cervical region: Secondary | ICD-10-CM | POA: Diagnosis not present

## 2023-12-29 DIAGNOSIS — M9901 Segmental and somatic dysfunction of cervical region: Secondary | ICD-10-CM | POA: Diagnosis not present

## 2024-01-16 DIAGNOSIS — M79674 Pain in right toe(s): Secondary | ICD-10-CM | POA: Diagnosis not present

## 2024-01-16 DIAGNOSIS — L600 Ingrowing nail: Secondary | ICD-10-CM | POA: Diagnosis not present

## 2024-01-16 DIAGNOSIS — M79675 Pain in left toe(s): Secondary | ICD-10-CM | POA: Diagnosis not present

## 2024-01-16 DIAGNOSIS — B351 Tinea unguium: Secondary | ICD-10-CM | POA: Diagnosis not present

## 2024-01-26 DIAGNOSIS — M47812 Spondylosis without myelopathy or radiculopathy, cervical region: Secondary | ICD-10-CM | POA: Diagnosis not present

## 2024-01-26 DIAGNOSIS — M9901 Segmental and somatic dysfunction of cervical region: Secondary | ICD-10-CM | POA: Diagnosis not present

## 2024-01-26 DIAGNOSIS — M531 Cervicobrachial syndrome: Secondary | ICD-10-CM | POA: Diagnosis not present

## 2024-02-27 DIAGNOSIS — B078 Other viral warts: Secondary | ICD-10-CM | POA: Diagnosis not present

## 2024-02-27 DIAGNOSIS — M47812 Spondylosis without myelopathy or radiculopathy, cervical region: Secondary | ICD-10-CM | POA: Diagnosis not present

## 2024-02-27 DIAGNOSIS — L820 Inflamed seborrheic keratosis: Secondary | ICD-10-CM | POA: Diagnosis not present

## 2024-02-27 DIAGNOSIS — M9901 Segmental and somatic dysfunction of cervical region: Secondary | ICD-10-CM | POA: Diagnosis not present

## 2024-02-27 DIAGNOSIS — L720 Epidermal cyst: Secondary | ICD-10-CM | POA: Diagnosis not present

## 2024-02-27 DIAGNOSIS — M531 Cervicobrachial syndrome: Secondary | ICD-10-CM | POA: Diagnosis not present

## 2024-02-27 DIAGNOSIS — L728 Other follicular cysts of the skin and subcutaneous tissue: Secondary | ICD-10-CM | POA: Diagnosis not present

## 2024-03-13 DIAGNOSIS — H353111 Nonexudative age-related macular degeneration, right eye, early dry stage: Secondary | ICD-10-CM | POA: Diagnosis not present

## 2024-03-13 DIAGNOSIS — H524 Presbyopia: Secondary | ICD-10-CM | POA: Diagnosis not present

## 2024-04-16 DIAGNOSIS — M79675 Pain in left toe(s): Secondary | ICD-10-CM | POA: Diagnosis not present

## 2024-04-16 DIAGNOSIS — L851 Acquired keratosis [keratoderma] palmaris et plantaris: Secondary | ICD-10-CM | POA: Diagnosis not present

## 2024-04-16 DIAGNOSIS — M2042 Other hammer toe(s) (acquired), left foot: Secondary | ICD-10-CM | POA: Diagnosis not present

## 2024-04-16 DIAGNOSIS — B351 Tinea unguium: Secondary | ICD-10-CM | POA: Diagnosis not present

## 2024-04-16 DIAGNOSIS — M79674 Pain in right toe(s): Secondary | ICD-10-CM | POA: Diagnosis not present

## 2024-04-16 DIAGNOSIS — M2041 Other hammer toe(s) (acquired), right foot: Secondary | ICD-10-CM | POA: Diagnosis not present

## 2024-04-18 DIAGNOSIS — Z1389 Encounter for screening for other disorder: Secondary | ICD-10-CM | POA: Diagnosis not present

## 2024-04-18 DIAGNOSIS — E785 Hyperlipidemia, unspecified: Secondary | ICD-10-CM | POA: Diagnosis not present

## 2024-04-18 DIAGNOSIS — Z0001 Encounter for general adult medical examination with abnormal findings: Secondary | ICD-10-CM | POA: Diagnosis not present

## 2024-04-18 DIAGNOSIS — Z6822 Body mass index (BMI) 22.0-22.9, adult: Secondary | ICD-10-CM | POA: Diagnosis not present

## 2024-04-18 DIAGNOSIS — I700 Atherosclerosis of aorta: Secondary | ICD-10-CM | POA: Diagnosis not present

## 2024-04-18 DIAGNOSIS — Z0000 Encounter for general adult medical examination without abnormal findings: Secondary | ICD-10-CM | POA: Diagnosis not present

## 2024-04-18 DIAGNOSIS — Z1331 Encounter for screening for depression: Secondary | ICD-10-CM | POA: Diagnosis not present

## 2024-04-18 DIAGNOSIS — Z1329 Encounter for screening for other suspected endocrine disorder: Secondary | ICD-10-CM | POA: Diagnosis not present
